# Patient Record
Sex: Female | Born: 1945 | Race: White | Hispanic: No | Marital: Single | Smoking: Never smoker
Health system: Southern US, Community
[De-identification: ages and names within clinical notes are randomized; demographics above are authoritative.]

## PROBLEM LIST (undated history)

## (undated) DIAGNOSIS — E785 Hyperlipidemia, unspecified: Secondary | ICD-10-CM

## (undated) DIAGNOSIS — I10 Essential (primary) hypertension: Secondary | ICD-10-CM

## (undated) DIAGNOSIS — U071 COVID-19: Secondary | ICD-10-CM

## (undated) DIAGNOSIS — K56609 Unspecified intestinal obstruction, unspecified as to partial versus complete obstruction: Secondary | ICD-10-CM

## (undated) DIAGNOSIS — K219 Gastro-esophageal reflux disease without esophagitis: Secondary | ICD-10-CM

## (undated) DIAGNOSIS — Z8719 Personal history of other diseases of the digestive system: Secondary | ICD-10-CM

## (undated) HISTORY — PX: BREAST CYST EXCISION: SHX579

## (undated) HISTORY — PX: ABDOMINAL HYSTERECTOMY: SHX81

## (undated) HISTORY — DX: Gastro-esophageal reflux disease without esophagitis: K21.9

## (undated) HISTORY — DX: COVID-19: U07.1

## (undated) HISTORY — PX: EXPLORATORY LAPAROTOMY: SUR591

## (undated) HISTORY — PX: APPENDECTOMY: SHX54

---

## 2000-05-07 ENCOUNTER — Encounter: Admission: RE | Admit: 2000-05-07 | Discharge: 2000-05-07 | Payer: Self-pay | Admitting: Gynecology

## 2000-05-07 ENCOUNTER — Encounter: Payer: Self-pay | Admitting: Gynecology

## 2000-11-24 ENCOUNTER — Encounter: Payer: Self-pay | Admitting: Gynecology

## 2000-11-24 ENCOUNTER — Encounter: Admission: RE | Admit: 2000-11-24 | Discharge: 2000-11-24 | Payer: Self-pay | Admitting: Gynecology

## 2001-01-18 ENCOUNTER — Other Ambulatory Visit: Admission: RE | Admit: 2001-01-18 | Discharge: 2001-01-18 | Payer: Self-pay | Admitting: Gynecology

## 2001-06-09 ENCOUNTER — Ambulatory Visit (HOSPITAL_COMMUNITY): Admission: RE | Admit: 2001-06-09 | Discharge: 2001-06-09 | Payer: Self-pay | Admitting: Family Medicine

## 2001-06-09 ENCOUNTER — Encounter: Payer: Self-pay | Admitting: Family Medicine

## 2001-11-25 ENCOUNTER — Encounter: Admission: RE | Admit: 2001-11-25 | Discharge: 2001-11-25 | Payer: Self-pay | Admitting: Gynecology

## 2001-11-25 ENCOUNTER — Encounter: Payer: Self-pay | Admitting: Gynecology

## 2001-12-31 ENCOUNTER — Ambulatory Visit (HOSPITAL_COMMUNITY): Admission: RE | Admit: 2001-12-31 | Discharge: 2001-12-31 | Payer: Self-pay | Admitting: Internal Medicine

## 2002-03-07 ENCOUNTER — Other Ambulatory Visit: Admission: RE | Admit: 2002-03-07 | Discharge: 2002-03-07 | Payer: Self-pay | Admitting: Gynecology

## 2002-11-29 ENCOUNTER — Encounter: Admission: RE | Admit: 2002-11-29 | Discharge: 2002-11-29 | Payer: Self-pay | Admitting: Gynecology

## 2002-11-29 ENCOUNTER — Encounter: Payer: Self-pay | Admitting: Gynecology

## 2003-03-20 ENCOUNTER — Other Ambulatory Visit: Admission: RE | Admit: 2003-03-20 | Discharge: 2003-03-20 | Payer: Self-pay | Admitting: Gynecology

## 2003-12-06 ENCOUNTER — Encounter: Admission: RE | Admit: 2003-12-06 | Discharge: 2003-12-06 | Payer: Self-pay | Admitting: Gynecology

## 2004-04-30 ENCOUNTER — Other Ambulatory Visit: Admission: RE | Admit: 2004-04-30 | Discharge: 2004-04-30 | Payer: Self-pay | Admitting: Gynecology

## 2005-01-21 ENCOUNTER — Encounter: Admission: RE | Admit: 2005-01-21 | Discharge: 2005-01-21 | Payer: Self-pay | Admitting: Gynecology

## 2005-02-11 ENCOUNTER — Ambulatory Visit: Payer: Self-pay | Admitting: Cardiology

## 2006-02-12 ENCOUNTER — Ambulatory Visit (HOSPITAL_COMMUNITY): Admission: RE | Admit: 2006-02-12 | Discharge: 2006-02-12 | Payer: Self-pay | Admitting: Gynecology

## 2006-02-16 ENCOUNTER — Ambulatory Visit: Payer: Self-pay | Admitting: Internal Medicine

## 2006-03-04 ENCOUNTER — Ambulatory Visit (HOSPITAL_COMMUNITY): Admission: RE | Admit: 2006-03-04 | Discharge: 2006-03-04 | Payer: Self-pay | Admitting: Gynecology

## 2006-03-19 ENCOUNTER — Other Ambulatory Visit: Admission: RE | Admit: 2006-03-19 | Discharge: 2006-03-19 | Payer: Self-pay | Admitting: Gynecology

## 2006-09-07 ENCOUNTER — Encounter: Admission: RE | Admit: 2006-09-07 | Discharge: 2006-09-07 | Payer: Self-pay | Admitting: Gynecology

## 2006-09-08 ENCOUNTER — Encounter (INDEPENDENT_AMBULATORY_CARE_PROVIDER_SITE_OTHER): Payer: Self-pay | Admitting: Specialist

## 2006-09-08 ENCOUNTER — Encounter: Admission: RE | Admit: 2006-09-08 | Discharge: 2006-09-08 | Payer: Self-pay | Admitting: Gynecology

## 2007-04-29 ENCOUNTER — Emergency Department (HOSPITAL_COMMUNITY): Admission: EM | Admit: 2007-04-29 | Discharge: 2007-04-29 | Payer: Self-pay | Admitting: Emergency Medicine

## 2007-09-13 ENCOUNTER — Encounter: Admission: RE | Admit: 2007-09-13 | Discharge: 2007-09-13 | Payer: Self-pay | Admitting: Gynecology

## 2008-10-19 ENCOUNTER — Encounter: Admission: RE | Admit: 2008-10-19 | Discharge: 2008-10-19 | Payer: Self-pay | Admitting: Gynecology

## 2009-10-22 ENCOUNTER — Encounter: Admission: RE | Admit: 2009-10-22 | Discharge: 2009-10-22 | Payer: Self-pay | Admitting: Family Medicine

## 2010-09-18 ENCOUNTER — Other Ambulatory Visit: Payer: Self-pay | Admitting: Family Medicine

## 2010-09-18 DIAGNOSIS — Z1231 Encounter for screening mammogram for malignant neoplasm of breast: Secondary | ICD-10-CM

## 2010-10-18 NOTE — Assessment & Plan Note (Signed)
[facility]                              CARDIOLOGY OFFICE NOTE   NAME:[name] [name]                  MRN:          [id number]  DATE:02/16/2006                            DOB:          01/22/1946    IDENTIFICATION:  [name] is a 65 year old woman with no known  coronary artery disease.  She was followed in the past by Dr. Juanito Doom for  evaluation of dyslipidemia, metabolic syndrome, strong family is of coronary  disease.  She has been maintained on Lipitor.   She was seen 1 year ago.  Since seen, she is doing well.  She notes  occasional chest twinge that will last about a second that occurs with and  without activity.  Otherwise, she is active.  Four days a week she walks  briskly for 40 minutes; otherwise, on the other days, she is active, up and  around and notes no decline in her ability to do things and no chest  pressure with activity.   She is enrolled in [employer] and has noted some weight loss with this.   CURRENT MEDICATIONS:  1. Aspirin 81 mg daily.  2. DynaCirc 10 mg daily.  3. Cozaar 50 mg daily.  4. Lipitor 5 mg daily.  5. Prevacid 30 mg daily.  6. Calcium.  7. Multivitamin daily.   PHYSICAL EXAMINATION:  GENERAL:  The patient is in no distress.  VITAL SIGNS:  Blood pressure 102/73, pulse is 68 and regular.  Weight 186,  down from 191 on last visit.  LUNGS:  Clear.  NECK:  No bruits.  CARDIAC:  Regular rate and rhythm, S1 and S2.  No S3.  No significant  murmurs.  ABDOMEN:  Benign.  EXTREMITIES:  Posterior tibial artery pulses 2+.  No edema.   TWELVE-LEAD EKG:  Normal sinus rhythm at 68 beats per minute.  Nonspecific  ST changes.   IMPRESSION:  1. Dyslipidemia.  Last lipid panel was actually done about a year ago with      an LDL of 64.  Her particle number was 963, HDL of 37, triglycerides of      159.  I would recommend a followup LipoMed as well as an AST and ALT.      Continue regimen for now.  2.  Hypertension, good control.  3. Weight loss.  Again, I applauded the patient and encouraged to her      continue on with [employer] and increase in her activity.   We will set up for 1 year's time.  If she has any symptoms that change, she  should come sooner.  I am not convinced the episodes of chest twinges are  cardiac in nature; they are very atypical.                                Pricilla Riffle, MD, [facility]    PVR/MedQ  DD:  02/16/2006  DT:  02/17/2006  Job #:  [id number]   cc:   Madelin Rear. Sherwood Gambler, MD  Gretta Cool, M.D.

## 2010-10-18 NOTE — Op Note (Signed)
NAME:  [name] [name], [name] [name]                     ACCOUNT NO.:  [id number]   MEDICAL RECORD NO.:  [id number]                   PATIENT TYPE:  AMB   LOCATION:  DAY                                  FACILITY:  [facility]   PHYSICIAN:  Lionel December, M.D.                 DATE OF BIRTH:  1945-12-02   DATE OF PROCEDURE:  12/31/2001  DATE OF DISCHARGE:                                 OPERATIVE REPORT   PROCEDURE PERFORMED:  Esophagogastroduodenoscopy followed by total  colonoscopy.   ENDOSCOPIST:  Lionel December, M.D.   INDICATIONS FOR PROCEDURE:  The patient is a 65 year old Caucasian female  who has chronic GERD symptoms for over four years.  She is doing well on  proton pump inhibitor.  She has failed H2s.  Given chronicity of her  symptoms an EGD was recommended.  She will undergo  esophagogastroduodenoscopy to make sure she does not have a Barrett's  esophagus or a complicated GERD.  She is undergoing screening colonoscopy.  The procedure risks were reviewed with the patient and informed consent was  obtained.   PREOP MEDICATIONS:  Cetacaine spray for pharyngeal topical anesthesia.  Demerol 40 mg IV, Versed 5 mg IV in divided dose.   INSTRUMENT USED:  Olympus video system.   FINDINGS:  Procedure performed in the endoscopy suite.  The patient's vital  signs and oxygen saturations were monitoring during the procedure and  remained stable.   PROCEDURE #1 -  Esophagogastroduodenoscopy.  The patient was placed in left  lateral position and the endoscope was passed per oropharynx without any  difficulty through the esophagus.  Esophagus -  Mucosa of the esophagus was normal throughout.  The  squamocolumnar junction was unremarkable.  There was a small sliding  hiatal  hernia.  Stomach - It was empty and distended on insufflation.  The folds of the  proximal stomach are normal.  Examination of mucosa at gastric body, antrum,  pyloric channel, as well as angularis and fundus was normal.  Duodenum - The duodenum as well as the bulb and second part of the duodenum  was also normal.   The endoscope was withdrawn and the patient prepared for procedure #2.   PROCEDURE #2 - Total colonoscopy.  Rectal examination performed.  No  abnormality noted on external or digital exam.  The scope was placed in the  rectum and advanced to the region of the sigmoid colon.  The sigmoid colon  was tortuous and some difficulty encountered in passing the scope  proximally.  Once the scope was past the splenic flexure it was easily  advanced to the cecum which was identified by the appendiceal stump and  ileocecal valve.  Pictures taken for the record.  Preparation was excellent.  As the scope was withdrawn, the colonoscope mucosa was carefully examined  and there were no polyps, tumor masses or diverticular changes.  Rectal  mucosa similarly is normal.  The scope was  retroflexed to examine the  anorectal junction.  Prominent anal papilla were noted.  The endoscope  straightened and withdrawn.  The patient tolerated the procedure well.   FINAL DIAGNOSES:  Small sliding  hiatal hernia, otherwise normal  esophagogastroduodenoscopy.  Normal total colonoscopy.   RECOMMENDATIONS:  She will continue antireflux measures and Prevacid.  She  also should continue yearly hemoccults.                                                Lionel December, M.D.    NR/MEDQ  D:  12/31/2001  T:  01/05/2002  Job:  [id number]   cc:   Marinus Maw, M.D.

## 2010-10-24 ENCOUNTER — Ambulatory Visit
Admission: RE | Admit: 2010-10-24 | Discharge: 2010-10-24 | Disposition: A | Discharge status: Home / Self Care | Payer: BC Managed Care – PPO | Source: Ambulatory Visit | Attending: Family Medicine | Admitting: Family Medicine

## 2010-10-24 DIAGNOSIS — Z1231 Encounter for screening mammogram for malignant neoplasm of breast: Secondary | ICD-10-CM

## 2011-07-09 ENCOUNTER — Other Ambulatory Visit (HOSPITAL_COMMUNITY): Payer: Self-pay | Admitting: Family Medicine

## 2011-07-09 DIAGNOSIS — Z139 Encounter for screening, unspecified: Secondary | ICD-10-CM

## 2011-07-09 DIAGNOSIS — Z01419 Encounter for gynecological examination (general) (routine) without abnormal findings: Secondary | ICD-10-CM

## 2011-07-16 ENCOUNTER — Ambulatory Visit (HOSPITAL_COMMUNITY)
Admission: RE | Admit: 2011-07-16 | Discharge: 2011-07-16 | Disposition: A | Discharge status: Home / Self Care | Payer: BC Managed Care – PPO | Source: Ambulatory Visit | Attending: Family Medicine | Admitting: Family Medicine

## 2011-07-16 DIAGNOSIS — Z13820 Encounter for screening for osteoporosis: Secondary | ICD-10-CM | POA: Insufficient documentation

## 2011-07-16 DIAGNOSIS — Z780 Asymptomatic menopausal state: Secondary | ICD-10-CM | POA: Insufficient documentation

## 2011-07-16 DIAGNOSIS — Z139 Encounter for screening, unspecified: Secondary | ICD-10-CM

## 2011-07-16 DIAGNOSIS — Z01419 Encounter for gynecological examination (general) (routine) without abnormal findings: Secondary | ICD-10-CM

## 2011-08-25 ENCOUNTER — Ambulatory Visit (HOSPITAL_COMMUNITY)
Admission: RE | Admit: 2011-08-25 | Discharge: 2011-08-25 | Disposition: A | Discharge status: Home / Self Care | Payer: BC Managed Care – PPO | Source: Ambulatory Visit | Attending: Family Medicine | Admitting: Family Medicine

## 2011-08-25 ENCOUNTER — Other Ambulatory Visit (HOSPITAL_COMMUNITY): Payer: Self-pay | Admitting: Family Medicine

## 2011-08-25 DIAGNOSIS — M65849 Other synovitis and tenosynovitis, unspecified hand: Secondary | ICD-10-CM

## 2011-08-25 DIAGNOSIS — M25539 Pain in unspecified wrist: Secondary | ICD-10-CM | POA: Insufficient documentation

## 2011-08-25 DIAGNOSIS — M65839 Other synovitis and tenosynovitis, unspecified forearm: Secondary | ICD-10-CM

## 2011-09-29 ENCOUNTER — Other Ambulatory Visit: Payer: Self-pay | Admitting: Family Medicine

## 2011-09-29 DIAGNOSIS — Z1231 Encounter for screening mammogram for malignant neoplasm of breast: Secondary | ICD-10-CM

## 2011-10-29 ENCOUNTER — Ambulatory Visit
Admission: RE | Admit: 2011-10-29 | Discharge: 2011-10-29 | Disposition: A | Discharge status: Home / Self Care | Payer: BC Managed Care – PPO | Source: Ambulatory Visit | Attending: Family Medicine | Admitting: Family Medicine

## 2011-10-29 DIAGNOSIS — Z1231 Encounter for screening mammogram for malignant neoplasm of breast: Secondary | ICD-10-CM

## 2012-01-08 ENCOUNTER — Encounter (INDEPENDENT_AMBULATORY_CARE_PROVIDER_SITE_OTHER): Payer: Self-pay | Admitting: *Deleted

## 2012-01-21 ENCOUNTER — Other Ambulatory Visit (INDEPENDENT_AMBULATORY_CARE_PROVIDER_SITE_OTHER): Payer: Self-pay | Admitting: *Deleted

## 2012-01-21 ENCOUNTER — Telehealth (INDEPENDENT_AMBULATORY_CARE_PROVIDER_SITE_OTHER): Payer: Self-pay | Admitting: *Deleted

## 2012-01-21 DIAGNOSIS — Z1211 Encounter for screening for malignant neoplasm of colon: Secondary | ICD-10-CM

## 2012-01-21 NOTE — Telephone Encounter (Signed)
Patient needs movi prep 

## 2012-01-23 MED ORDER — PEG-KCL-NACL-NASULF-NA ASC-C 100 G PO SOLR: 1.0000 | Freq: Once | ORAL | Status: DC

## 2012-01-26 DIAGNOSIS — Z6833 Body mass index (BMI) 33.0-33.9, adult: Secondary | ICD-10-CM | POA: Diagnosis not present

## 2012-01-26 DIAGNOSIS — Z0000 Encounter for general adult medical examination without abnormal findings: Secondary | ICD-10-CM | POA: Diagnosis not present

## 2012-02-09 ENCOUNTER — Other Ambulatory Visit (HOSPITAL_COMMUNITY): Payer: Self-pay | Admitting: Family Medicine

## 2012-02-09 DIAGNOSIS — R945 Abnormal results of liver function studies: Secondary | ICD-10-CM

## 2012-02-11 ENCOUNTER — Ambulatory Visit (HOSPITAL_COMMUNITY)
Admission: RE | Admit: 2012-02-11 | Discharge: 2012-02-11 | Disposition: A | Discharge status: Home / Self Care | Payer: Medicare Other | Source: Ambulatory Visit | Attending: Family Medicine | Admitting: Family Medicine

## 2012-02-11 DIAGNOSIS — K7689 Other specified diseases of liver: Secondary | ICD-10-CM | POA: Insufficient documentation

## 2012-02-11 DIAGNOSIS — R945 Abnormal results of liver function studies: Secondary | ICD-10-CM

## 2012-02-11 DIAGNOSIS — R748 Abnormal levels of other serum enzymes: Secondary | ICD-10-CM | POA: Insufficient documentation

## 2012-02-19 ENCOUNTER — Telehealth (INDEPENDENT_AMBULATORY_CARE_PROVIDER_SITE_OTHER): Payer: Self-pay | Admitting: *Deleted

## 2012-02-19 NOTE — Telephone Encounter (Signed)
PCP/Requesting MD: golding  Name & DOB: [name] [name] 1945/08/13   Procedure: tcs  Reason/Indication:  screening  Has patient had this procedure before?  yes  If so, when, by whom and where?  2003  Is there a family history of colon cancer?  no  Who?  What age when diagnosed?    Is patient diabetic?   no      Does patient have prosthetic heart valve?  no  Do you have a pacemaker?  no  Has patient had joint replacement within last 12 months?  no  Is patient on Coumadin, Plavix and/or Aspirin? yes  Medications: asa 81 mg daily, meloxicam 15 mg daily, losartan 50 mg daily, atorvastatin 10 mg daily, amlodipine 5 mg daily, omeprazole 40 mg daily, vit e 1200 mg bid, vit d 1000 mg daily, glucosamine 1500 mg bid, clacium 600 mg w/ d3 bid, multi vit, potassium  Allergies: nkda  Medication Adjustment: asa 2 days  Procedure date & time: 03/18/12 at 930

## 2012-02-24 NOTE — Telephone Encounter (Signed)
Agree 

## 2012-03-08 ENCOUNTER — Other Ambulatory Visit (HOSPITAL_COMMUNITY): Payer: Self-pay | Admitting: Family Medicine

## 2012-03-08 DIAGNOSIS — Z139 Encounter for screening, unspecified: Secondary | ICD-10-CM

## 2012-03-10 ENCOUNTER — Encounter (HOSPITAL_COMMUNITY): Payer: Self-pay | Admitting: Pharmacy Technician

## 2012-03-17 MED ORDER — SODIUM CHLORIDE 0.45 % IV SOLN
INTRAVENOUS | Status: DC
  Administered 2012-03-18: 09:00:00 via INTRAVENOUS

## 2012-03-18 ENCOUNTER — Encounter (HOSPITAL_COMMUNITY): Payer: Self-pay | Admitting: *Deleted

## 2012-03-18 ENCOUNTER — Ambulatory Visit (HOSPITAL_COMMUNITY)
Admission: RE | Admit: 2012-03-18 | Discharge: 2012-03-18 | Disposition: A | Discharge status: Home / Self Care | Payer: Medicare Other | Source: Ambulatory Visit | Attending: Internal Medicine | Admitting: Internal Medicine

## 2012-03-18 ENCOUNTER — Encounter (HOSPITAL_COMMUNITY)
Admission: RE | Disposition: A | Discharge status: Home / Self Care | Payer: Self-pay | Source: Ambulatory Visit | Attending: Internal Medicine

## 2012-03-18 DIAGNOSIS — K6389 Other specified diseases of intestine: Secondary | ICD-10-CM | POA: Diagnosis not present

## 2012-03-18 DIAGNOSIS — K644 Residual hemorrhoidal skin tags: Secondary | ICD-10-CM | POA: Insufficient documentation

## 2012-03-18 DIAGNOSIS — D175 Benign lipomatous neoplasm of intra-abdominal organs: Secondary | ICD-10-CM | POA: Diagnosis not present

## 2012-03-18 DIAGNOSIS — Z1211 Encounter for screening for malignant neoplasm of colon: Secondary | ICD-10-CM

## 2012-03-18 HISTORY — PX: COLONOSCOPY: SHX5424

## 2012-03-18 HISTORY — DX: Personal history of other diseases of the digestive system: Z87.19

## 2012-03-18 SURGERY — COLONOSCOPY
Anesthesia: Moderate Sedation

## 2012-03-18 MED ORDER — MIDAZOLAM HCL 5 MG/5ML IJ SOLN
INTRAMUSCULAR | Status: AC
  Filled 2012-03-18: qty 10

## 2012-03-18 MED ORDER — MEPERIDINE HCL 50 MG/ML IJ SOLN
INTRAMUSCULAR | Status: AC
  Filled 2012-03-18: qty 1

## 2012-03-18 MED ORDER — MEPERIDINE HCL 50 MG/ML IJ SOLN
INTRAMUSCULAR | Status: DC | PRN
  Administered 2012-03-18 (×2): 25 mg via INTRAVENOUS

## 2012-03-18 MED ORDER — STERILE WATER FOR IRRIGATION IR SOLN
Status: DC | PRN
  Administered 2012-03-18: 10:00:00

## 2012-03-18 MED ORDER — MIDAZOLAM HCL 5 MG/5ML IJ SOLN
INTRAMUSCULAR | Status: DC | PRN
  Administered 2012-03-18 (×2): 2 mg via INTRAVENOUS
  Administered 2012-03-18: 1 mg via INTRAVENOUS

## 2012-03-18 NOTE — H&P (Signed)
[name] [name] is an 66 y.o. female.   Chief Complaint: Patient is here for colonoscopy. HPI: Patient is 66 year old Caucasian female colonoscopy. Patient's last exam was 10 years. She denies abdominal pain change in bowel habits or rectal bleeding. Family history is negative for colorectal carcinoma.  Past Medical History  Diagnosis Date  . H/O hiatal hernia     Past Surgical History  Procedure Date  . Appendectomy   . Abdominal hysterectomy     History reviewed. No pertinent family history. Social History:  reports that she has never smoked. She does not have any smokeless tobacco history on file. She reports that she does not drink alcohol or use illicit drugs.  Allergies: No Known Allergies  Medications Prior to Admission  Medication Sig Dispense Refill  . amLODipine (NORVASC) 5 MG tablet Take 5 mg by mouth daily.      Marland Kitchen aspirin EC 81 MG tablet Take 81 mg by mouth daily.      Marland Kitchen atorvastatin (LIPITOR) 10 MG tablet Take 10 mg by mouth daily.      . beta carotene w/minerals (OCUVITE) tablet Take 1 tablet by mouth daily.      . Calcium Carbonate-Vitamin D (CALCIUM 600 + D PO) Take 1 tablet by mouth 2 (two) times daily.      . cholecalciferol (VITAMIN D) 1000 UNITS tablet Take 1,000 Units by mouth daily.      Marland Kitchen losartan (COZAAR) 50 MG tablet Take 50 mg by mouth daily.      . meloxicam (MOBIC) 15 MG tablet Take 15 mg by mouth daily.      . Misc Natural Products (GLUCOS-CHONDROIT-MSM COMPLEX PO) Take 1 capsule by mouth 2 (two) times daily.      . Multiple Vitamin (MULTIVITAMIN WITH MINERALS) TABS Take 1 tablet by mouth daily.      . Omega-3 Fatty Acids (FISH OIL) 1200 MG CAPS Take 1,200 mg by mouth 2 (two) times daily.      Marland Kitchen omeprazole (PRILOSEC) 40 MG capsule Take 40 mg by mouth daily.      . peg 3350 powder (MOVIPREP) 100 G SOLR Take 1 kit (100 g total) by mouth once.  1 kit  0  . Potassium 99 MG TABS Take 1 tablet by mouth daily.        No results found for this or any  previous visit (from the past 48 hour(s)). No results found.  ROS  Blood pressure 137/91, pulse 73, temperature 97.8 F (36.6 C), temperature source Oral, resp. rate 24, height 5\' 4"  (1.626 m), weight 190 lb (86.183 kg), SpO2 95.00%. Physical Exam  Constitutional: She appears well-developed and well-nourished.  HENT:  Mouth/Throat: Oropharynx is clear and moist.  Eyes: Conjunctivae normal are normal. No scleral icterus.  Cardiovascular: Normal rate, regular rhythm and normal heart sounds.   No murmur heard. Respiratory: Effort normal and breath sounds normal.  GI: Soft. She exhibits no mass. There is no tenderness.  Musculoskeletal: She exhibits no edema.  Lymphadenopathy:    She has no cervical adenopathy.  Neurological: She is alert.  Skin: Skin is warm and dry.     Assessment/Plan Average risk screening colonoscopy.  REHMAN,NAJEEB U 03/18/2012, 9:37 AM

## 2012-03-18 NOTE — Op Note (Signed)
COLONOSCOPY PROCEDURE REPORT  PATIENT:  [name] [name]  MR#:  [id number] Birthdate:  [birth year], 66 y.o., female Endoscopist:  Dr. Malissa Hippo, MD Referred By:  Dr. Colette Ribas, MD Procedure Date: 03/18/2012  Procedure:   Colonoscopy  Indications: Patient is 65 year old Caucasian female was undergoing average risk screening colonoscopy. Patient's last exam was 10 years ago.  Informed Consent:  The procedure and risks were reviewed with the patient and informed consent was obtained.  Medications:  Demerol 50 mg IV Versed 5 mg IV  Description of procedure:  After a digital rectal exam was performed, that colonoscope was advanced from the anus through the rectum and colon to the area of the cecum, ileocecal valve and appendiceal orifice. The cecum was deeply intubated. These structures were well-seen and photographed for the record. From the level of the cecum and ileocecal valve, the scope was slowly and cautiously withdrawn. The mucosal surfaces were carefully surveyed utilizing scope tip to flexion to facilitate fold flattening as needed. The scope was pulled down into the rectum where a thorough exam including retroflexion was performed.  Findings:   Prep satisfactory. 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 Normal mucosa throughout. Normal rectal mucosa. Two anal papillae and small hemorrhoids below the dentate line.  Therapeutic/Diagnostic Maneuvers Performed:  None  Complications:  None  Cecal Withdrawal Time:  12 minutes  Impression:  Examination performed to cecum. No evidence of colonic polyps. 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  Recommendations:  Standard instructions given. Next screening exam in 10 years.  Alease Fait U  03/18/2012 10:08 AM  CC: Dr. Colette Ribas, MD & Dr. Bonnetta Barry ref. provider found

## 2012-03-19 LAB — HM COLONOSCOPY

## 2012-03-24 DIAGNOSIS — Z961 Presence of intraocular lens: Secondary | ICD-10-CM | POA: Diagnosis not present

## 2012-03-24 DIAGNOSIS — H35349 Macular cyst, hole, or pseudohole, unspecified eye: Secondary | ICD-10-CM | POA: Diagnosis not present

## 2012-03-24 DIAGNOSIS — H4720 Unspecified optic atrophy: Secondary | ICD-10-CM | POA: Diagnosis not present

## 2012-03-24 DIAGNOSIS — H31009 Unspecified chorioretinal scars, unspecified eye: Secondary | ICD-10-CM | POA: Diagnosis not present

## 2012-03-25 ENCOUNTER — Encounter (HOSPITAL_COMMUNITY): Payer: Self-pay | Admitting: Internal Medicine

## 2012-05-12 ENCOUNTER — Other Ambulatory Visit (HOSPITAL_COMMUNITY): Payer: Self-pay | Admitting: Physician Assistant

## 2012-05-12 ENCOUNTER — Ambulatory Visit (HOSPITAL_COMMUNITY)
Admission: RE | Admit: 2012-05-12 | Discharge: 2012-05-12 | Disposition: A | Discharge status: Home / Self Care | Payer: Medicare Other | Source: Ambulatory Visit | Attending: Physician Assistant | Admitting: Physician Assistant

## 2012-05-12 DIAGNOSIS — M25569 Pain in unspecified knee: Secondary | ICD-10-CM

## 2012-05-12 DIAGNOSIS — Z6834 Body mass index (BMI) 34.0-34.9, adult: Secondary | ICD-10-CM | POA: Diagnosis not present

## 2012-05-12 DIAGNOSIS — M1710 Unilateral primary osteoarthritis, unspecified knee: Secondary | ICD-10-CM | POA: Diagnosis not present

## 2012-05-12 DIAGNOSIS — M2240 Chondromalacia patellae, unspecified knee: Secondary | ICD-10-CM | POA: Diagnosis not present

## 2012-06-28 DIAGNOSIS — M25569 Pain in unspecified knee: Secondary | ICD-10-CM | POA: Diagnosis not present

## 2012-06-28 DIAGNOSIS — Z6834 Body mass index (BMI) 34.0-34.9, adult: Secondary | ICD-10-CM | POA: Diagnosis not present

## 2012-06-28 DIAGNOSIS — M159 Polyosteoarthritis, unspecified: Secondary | ICD-10-CM | POA: Diagnosis not present

## 2012-09-13 DIAGNOSIS — J019 Acute sinusitis, unspecified: Secondary | ICD-10-CM | POA: Diagnosis not present

## 2012-09-13 DIAGNOSIS — I10 Essential (primary) hypertension: Secondary | ICD-10-CM | POA: Diagnosis not present

## 2012-09-13 DIAGNOSIS — Z6834 Body mass index (BMI) 34.0-34.9, adult: Secondary | ICD-10-CM | POA: Diagnosis not present

## 2012-09-13 DIAGNOSIS — Z713 Dietary counseling and surveillance: Secondary | ICD-10-CM | POA: Diagnosis not present

## 2012-09-13 DIAGNOSIS — Z7182 Exercise counseling: Secondary | ICD-10-CM | POA: Diagnosis not present

## 2012-09-23 ENCOUNTER — Other Ambulatory Visit: Payer: Self-pay

## 2012-09-23 DIAGNOSIS — Z1231 Encounter for screening mammogram for malignant neoplasm of breast: Secondary | ICD-10-CM

## 2012-09-27 ENCOUNTER — Other Ambulatory Visit (HOSPITAL_COMMUNITY): Payer: Self-pay | Admitting: Physician Assistant

## 2012-09-27 DIAGNOSIS — R945 Abnormal results of liver function studies: Secondary | ICD-10-CM

## 2012-09-30 ENCOUNTER — Ambulatory Visit (HOSPITAL_COMMUNITY): Payer: Medicare Other

## 2012-10-04 ENCOUNTER — Ambulatory Visit (HOSPITAL_COMMUNITY)
Admission: RE | Admit: 2012-10-04 | Discharge: 2012-10-04 | Disposition: A | Discharge status: Home / Self Care | Payer: Medicare Other | Source: Ambulatory Visit | Attending: Physician Assistant | Admitting: Physician Assistant

## 2012-10-04 DIAGNOSIS — R9389 Abnormal findings on diagnostic imaging of other specified body structures: Secondary | ICD-10-CM | POA: Insufficient documentation

## 2012-10-04 DIAGNOSIS — R748 Abnormal levels of other serum enzymes: Secondary | ICD-10-CM | POA: Diagnosis not present

## 2012-10-04 DIAGNOSIS — N281 Cyst of kidney, acquired: Secondary | ICD-10-CM | POA: Diagnosis not present

## 2012-10-04 DIAGNOSIS — K7689 Other specified diseases of liver: Secondary | ICD-10-CM | POA: Insufficient documentation

## 2012-10-04 DIAGNOSIS — R945 Abnormal results of liver function studies: Secondary | ICD-10-CM

## 2012-10-05 ENCOUNTER — Other Ambulatory Visit (HOSPITAL_COMMUNITY): Payer: Self-pay | Admitting: Physician Assistant

## 2012-10-05 DIAGNOSIS — Q613 Polycystic kidney, unspecified: Secondary | ICD-10-CM

## 2012-10-07 ENCOUNTER — Ambulatory Visit (HOSPITAL_COMMUNITY)
Admission: RE | Admit: 2012-10-07 | Discharge: 2012-10-07 | Disposition: A | Discharge status: Home / Self Care | Payer: Medicare Other | Source: Ambulatory Visit | Attending: Physician Assistant | Admitting: Physician Assistant

## 2012-10-07 DIAGNOSIS — K7689 Other specified diseases of liver: Secondary | ICD-10-CM | POA: Insufficient documentation

## 2012-10-07 DIAGNOSIS — Q619 Cystic kidney disease, unspecified: Secondary | ICD-10-CM | POA: Insufficient documentation

## 2012-10-07 DIAGNOSIS — D3000 Benign neoplasm of unspecified kidney: Secondary | ICD-10-CM | POA: Diagnosis not present

## 2012-10-07 DIAGNOSIS — Q613 Polycystic kidney, unspecified: Secondary | ICD-10-CM

## 2012-10-07 LAB — POCT I-STAT, CHEM 8
BUN: 10 mg/dL (ref 6–23)
Calcium, Ion: 1.24 mmol/L (ref 1.13–1.30)
Chloride: 106 mEq/L (ref 96–112)
Creatinine, Ser: 0.7 mg/dL (ref 0.50–1.10)
Glucose, Bld: 81 mg/dL (ref 70–99)

## 2012-10-07 MED ORDER — IOHEXOL 300 MG/ML  SOLN
100.0000 mL | Freq: Once | INTRAMUSCULAR | Status: AC | PRN
  Administered 2012-10-07: 100 mL via INTRAVENOUS

## 2012-10-07 NOTE — Progress Notes (Signed)
Blood sample obtained from left arm IV for Creatnine level.  

## 2012-10-29 ENCOUNTER — Ambulatory Visit
Admission: RE | Admit: 2012-10-29 | Discharge: 2012-10-29 | Disposition: A | Discharge status: Home / Self Care | Payer: Medicare Other | Source: Ambulatory Visit

## 2012-10-29 DIAGNOSIS — Z1231 Encounter for screening mammogram for malignant neoplasm of breast: Secondary | ICD-10-CM | POA: Diagnosis not present

## 2013-02-12 ENCOUNTER — Emergency Department (HOSPITAL_COMMUNITY): Payer: Medicare Other

## 2013-02-12 ENCOUNTER — Encounter (HOSPITAL_COMMUNITY): Payer: Self-pay | Admitting: *Deleted

## 2013-02-12 ENCOUNTER — Inpatient Hospital Stay (HOSPITAL_COMMUNITY)
Admission: EM | Admit: 2013-02-12 | Discharge: 2013-02-17 | DRG: 358 | Disposition: A | Discharge status: Home / Self Care | Payer: Medicare Other | Attending: Family Medicine | Admitting: Family Medicine

## 2013-02-12 ENCOUNTER — Inpatient Hospital Stay (HOSPITAL_COMMUNITY): Payer: Medicare Other

## 2013-02-12 DIAGNOSIS — R7401 Elevation of levels of liver transaminase levels: Secondary | ICD-10-CM

## 2013-02-12 DIAGNOSIS — R188 Other ascites: Secondary | ICD-10-CM | POA: Diagnosis not present

## 2013-02-12 DIAGNOSIS — R112 Nausea with vomiting, unspecified: Secondary | ICD-10-CM | POA: Diagnosis present

## 2013-02-12 DIAGNOSIS — D72829 Elevated white blood cell count, unspecified: Secondary | ICD-10-CM

## 2013-02-12 DIAGNOSIS — K56609 Unspecified intestinal obstruction, unspecified as to partial versus complete obstruction: Secondary | ICD-10-CM | POA: Diagnosis not present

## 2013-02-12 DIAGNOSIS — E876 Hypokalemia: Secondary | ICD-10-CM | POA: Diagnosis not present

## 2013-02-12 DIAGNOSIS — K565 Intestinal adhesions [bands], unspecified as to partial versus complete obstruction: Secondary | ICD-10-CM | POA: Diagnosis not present

## 2013-02-12 DIAGNOSIS — K56600 Partial intestinal obstruction, unspecified as to cause: Secondary | ICD-10-CM

## 2013-02-12 DIAGNOSIS — I10 Essential (primary) hypertension: Secondary | ICD-10-CM | POA: Diagnosis present

## 2013-02-12 DIAGNOSIS — Z801 Family history of malignant neoplasm of trachea, bronchus and lung: Secondary | ICD-10-CM

## 2013-02-12 DIAGNOSIS — K7689 Other specified diseases of liver: Secondary | ICD-10-CM | POA: Diagnosis present

## 2013-02-12 DIAGNOSIS — R109 Unspecified abdominal pain: Secondary | ICD-10-CM | POA: Diagnosis not present

## 2013-02-12 DIAGNOSIS — E860 Dehydration: Secondary | ICD-10-CM | POA: Diagnosis not present

## 2013-02-12 DIAGNOSIS — R7402 Elevation of levels of lactic acid dehydrogenase (LDH): Secondary | ICD-10-CM | POA: Diagnosis present

## 2013-02-12 DIAGNOSIS — K458 Other specified abdominal hernia without obstruction or gangrene: Secondary | ICD-10-CM | POA: Diagnosis present

## 2013-02-12 DIAGNOSIS — Z79899 Other long term (current) drug therapy: Secondary | ICD-10-CM | POA: Diagnosis not present

## 2013-02-12 DIAGNOSIS — Z23 Encounter for immunization: Secondary | ICD-10-CM

## 2013-02-12 DIAGNOSIS — R1011 Right upper quadrant pain: Secondary | ICD-10-CM | POA: Diagnosis not present

## 2013-02-12 DIAGNOSIS — Z8051 Family history of malignant neoplasm of kidney: Secondary | ICD-10-CM

## 2013-02-12 DIAGNOSIS — K760 Fatty (change of) liver, not elsewhere classified: Secondary | ICD-10-CM

## 2013-02-12 DIAGNOSIS — D3000 Benign neoplasm of unspecified kidney: Secondary | ICD-10-CM | POA: Diagnosis not present

## 2013-02-12 LAB — COMPREHENSIVE METABOLIC PANEL
AST: 41 U/L — ABNORMAL HIGH (ref 0–37)
Albumin: 4 g/dL (ref 3.5–5.2)
BUN: 13 mg/dL (ref 6–23)
Calcium: 9.6 mg/dL (ref 8.4–10.5)
Creatinine, Ser: 0.6 mg/dL (ref 0.50–1.10)
GFR calc non Af Amer: >90 mL/min (ref >90)
Total Bilirubin: 1.1 mg/dL (ref 0.3–1.2)

## 2013-02-12 LAB — CBC WITH DIFFERENTIAL/PLATELET
Basophils Absolute: 0 10*3/uL (ref 0.0–0.1)
Basophils Relative: 0 % (ref 0–1)
Eosinophils Relative: 0 % (ref 0–5)
HCT: 45.3 % (ref 36.0–46.0)
Hemoglobin: 15.3 g/dL — ABNORMAL HIGH (ref 12.0–15.0)
MCH: 30.5 pg (ref 26.0–34.0)
MCHC: 33.8 g/dL (ref 30.0–36.0)
MCV: 90.4 fL (ref 78.0–100.0)
Monocytes Absolute: 1.3 10*3/uL — ABNORMAL HIGH (ref 0.1–1.0)
Monocytes Relative: 7 % (ref 3–12)
RDW: 13.1 % (ref 11.5–15.5)

## 2013-02-12 LAB — LIPASE, BLOOD: Lipase: 35 U/L (ref 11–59)

## 2013-02-12 MED ORDER — SODIUM CHLORIDE 0.9 % IV SOLN
1000.0000 mL | Freq: Once | INTRAVENOUS | Status: AC
  Administered 2013-02-12: 1000 mL via INTRAVENOUS

## 2013-02-12 MED ORDER — ACETAMINOPHEN 325 MG PO TABS: 650.0000 mg | ORAL_TABLET | Freq: Four times a day (QID) | ORAL | Status: DC | PRN

## 2013-02-12 MED ORDER — MORPHINE SULFATE 4 MG/ML IJ SOLN
4.0000 mg | Freq: Once | INTRAMUSCULAR | Status: AC
  Administered 2013-02-12: 4 mg via INTRAVENOUS
  Filled 2013-02-12: qty 1

## 2013-02-12 MED ORDER — ONDANSETRON HCL 4 MG/2ML IJ SOLN
4.0000 mg | Freq: Four times a day (QID) | INTRAMUSCULAR | Status: DC | PRN
  Filled 2013-02-12: qty 2

## 2013-02-12 MED ORDER — ONDANSETRON HCL 4 MG/2ML IJ SOLN
4.0000 mg | Freq: Three times a day (TID) | INTRAMUSCULAR | Status: DC | PRN
  Administered 2013-02-12: 4 mg via INTRAVENOUS
  Filled 2013-02-12: qty 2

## 2013-02-12 MED ORDER — INFLUENZA VAC SPLIT QUAD 0.5 ML IM SUSP
0.5000 mL | INTRAMUSCULAR | Status: AC
  Administered 2013-02-13: 0.5 mL via INTRAMUSCULAR
  Filled 2013-02-12: qty 0.5

## 2013-02-12 MED ORDER — MORPHINE SULFATE 4 MG/ML IJ SOLN
4.0000 mg | INTRAMUSCULAR | Status: DC | PRN
  Administered 2013-02-12: 4 mg via INTRAVENOUS
  Filled 2013-02-12: qty 1

## 2013-02-12 MED ORDER — SODIUM CHLORIDE 0.9 % IV SOLN: INTRAVENOUS | Status: DC

## 2013-02-12 MED ORDER — SODIUM CHLORIDE 0.9 % IV SOLN
3.0000 g | Freq: Once | INTRAVENOUS | Status: AC
  Administered 2013-02-12: 3 g via INTRAVENOUS
  Filled 2013-02-12: qty 3

## 2013-02-12 MED ORDER — SODIUM CHLORIDE 0.9 % IV SOLN
1000.0000 mL | INTRAVENOUS | Status: DC
  Administered 2013-02-12 – 2013-02-13 (×2): 1000 mL via INTRAVENOUS

## 2013-02-12 MED ORDER — SODIUM CHLORIDE 0.9 % IV BOLUS (SEPSIS): 1000.0000 mL | Freq: Once | INTRAVENOUS | Status: DC

## 2013-02-12 MED ORDER — IOHEXOL 300 MG/ML  SOLN
50.0000 mL | Freq: Once | INTRAMUSCULAR | Status: AC | PRN
  Administered 2013-02-12: 50 mL via ORAL

## 2013-02-12 MED ORDER — ONDANSETRON HCL 4 MG/2ML IJ SOLN
4.0000 mg | Freq: Once | INTRAMUSCULAR | Status: AC
  Administered 2013-02-12: 4 mg via INTRAVENOUS
  Filled 2013-02-12: qty 2

## 2013-02-12 MED ORDER — ENOXAPARIN SODIUM 40 MG/0.4ML ~~LOC~~ SOLN
40.0000 mg | SUBCUTANEOUS | Status: DC
  Administered 2013-02-12 – 2013-02-13 (×2): 40 mg via SUBCUTANEOUS
  Filled 2013-02-12 (×2): qty 0.4

## 2013-02-12 MED ORDER — IOHEXOL 300 MG/ML  SOLN
100.0000 mL | Freq: Once | INTRAMUSCULAR | Status: AC | PRN
  Administered 2013-02-12: 100 mL via INTRAVENOUS

## 2013-02-12 MED ORDER — PIPERACILLIN-TAZOBACTAM 3.375 G IVPB
3.3750 g | Freq: Three times a day (TID) | INTRAVENOUS | Status: DC
  Administered 2013-02-12 – 2013-02-17 (×15): 3.375 g via INTRAVENOUS
  Filled 2013-02-12 (×18): qty 50

## 2013-02-12 MED ORDER — MORPHINE SULFATE 2 MG/ML IJ SOLN: 2.0000 mg | INTRAMUSCULAR | Status: DC | PRN

## 2013-02-12 MED ORDER — PIPERACILLIN-TAZOBACTAM 3.375 G IVPB
INTRAVENOUS | Status: AC
  Filled 2013-02-12: qty 100

## 2013-02-12 MED ORDER — ACETAMINOPHEN 650 MG RE SUPP: 650.0000 mg | Freq: Four times a day (QID) | RECTAL | Status: DC | PRN

## 2013-02-12 MED ORDER — ONDANSETRON HCL 4 MG PO TABS: 4.0000 mg | ORAL_TABLET | Freq: Four times a day (QID) | ORAL | Status: DC | PRN

## 2013-02-12 NOTE — ED Notes (Signed)
Pain and nausea are starting to return, Dr. Lynelle Doctor notified, additional orders given

## 2013-02-12 NOTE — H&P (Signed)
[facility] History and Physical  SHAQUERA [name] HYQ:[id number] DOB: 1945/11/12 DOA: 02/12/2013  Referring physician: Dr. Lynelle Doctor, ER physician PCP: Colette Ribas, MD  Specialists:   Chief Complaint: Vomiting and abdominal pain  HPI: [name] [name] is a 67 y.o. female who presents to the emergency room with complaints of intractable vomiting and abdominal pain. She reports onset of his symptoms occurring yesterday after she ate cereal and bananas. She reports subsequently she developed lower abdominal pain as well as persistent vomiting. Initially vomitus contained ingested food material, subsequently it became clear. She denied any hematemesis. She reports having bowel movements, last one was on the day of admission. She does not feel that she has been passing flatus. She was also noted abdominal distention. Prior surgical history does include an appendectomy and hysterectomy in the past. She's not had any bowel obstructions in the past. She has felt flushed at home but has not documented a fever. No shortness of breath, cough, dysuria, chest pain, melena, hematochezia or complaints. She was evaluated in the emergency room where CT scan of the abdomen and pelvis indicated a partial small bowel obstruction. Patient has been referred for admission.  Review of Systems: Pertinent positives as per history of present illness, otherwise negative  Past Medical History  Diagnosis Date  . H/O hiatal hernia    Past Surgical History  Procedure Laterality Date  . Appendectomy    . Abdominal hysterectomy    . Colonoscopy  03/18/2012    Procedure: COLONOSCOPY;  Surgeon: Malissa Hippo, MD;  Location: [facility];  Service: Endoscopy;  Laterality: N/A;  930   Social History:  reports that she has never smoked. She does not have any smokeless tobacco history on file. She reports that she does not drink alcohol or use illicit drugs.   No Known Allergies  Family history: Father died  due to metastatic lung cancer, mother died from kidney cancer.  Prior to Admission medications   Medication Sig Start Date End Date Taking? Authorizing Provider  amLODipine (NORVASC) 5 MG tablet Take 5 mg by mouth daily.   Yes Historical Provider, MD  aspirin EC 81 MG tablet Take 81 mg by mouth daily.   Yes Historical Provider, MD  atorvastatin (LIPITOR) 10 MG tablet Take 10 mg by mouth daily.   Yes Historical Provider, MD  beta carotene w/minerals (OCUVITE) tablet Take 1 tablet by mouth daily.   Yes Historical Provider, MD  Calcium Carbonate-Vitamin D (CALCIUM 600 + D PO) Take 1 tablet by mouth 2 (two) times daily.   Yes Historical Provider, MD  cholecalciferol (VITAMIN D) 1000 UNITS tablet Take 1,000 Units by mouth daily.   Yes Historical Provider, MD  losartan (COZAAR) 50 MG tablet Take 50 mg by mouth daily.   Yes Historical Provider, MD  Misc Natural Products (GLUCOS-CHONDROIT-MSM COMPLEX PO) Take 1 capsule by mouth 2 (two) times daily.   Yes Historical Provider, MD  Multiple Vitamin (MULTIVITAMIN WITH MINERALS) TABS Take 1 tablet by mouth daily.   Yes Historical Provider, MD  Omega-3 Fatty Acids (FISH OIL) 1200 MG CAPS Take 1,200 mg by mouth 2 (two) times daily.   Yes Historical Provider, MD  omeprazole (PRILOSEC) 40 MG capsule Take 40 mg by mouth daily.   Yes Historical Provider, MD  Potassium 99 MG TABS Take 1 tablet by mouth daily.   Yes Historical Provider, MD   Physical Exam: Filed Vitals:   02/12/13 1843  BP: 132/80  Pulse: 83  Temp: 97.8  F (36.6 C)  Resp:      General:  NAD  Eyes: Pupils are equal, round, reactive to light  ENT: Mucous membranes are dry  Neck: Supple  Cardiovascular: S1, S2, regular rate and rhythm  Respiratory: Clear to auscultation bilaterally  Abdomen: Soft, tender in the right lower quadrant as well as epigastrium, bowel sounds are very sluggish to absent.  Skin: No rashes  Musculoskeletal: No pedal edema bilaterally  Psychiatric: Normal  affect, cooperative with exam  Neurologic: Grossly intact, nonfocal  Labs on Admission:  Basic Metabolic Panel:  Recent Labs Lab 02/12/13 1435  NA 136  K 3.6  CL 98  CO2 24  GLUCOSE 174*  BUN 13  CREATININE 0.60  CALCIUM 9.6   Liver Function Tests:  Recent Labs Lab 02/12/13 1435  AST 41*  ALT 46*  ALKPHOS 89  BILITOT 1.1  PROT 7.6  ALBUMIN 4.0    Recent Labs Lab 02/12/13 1435  LIPASE 35   No results found for this basename: AMMONIA,  in the last 168 hours CBC:  Recent Labs Lab 02/12/13 1435  WBC 19.7*  NEUTROABS 16.6*  HGB 15.3*  HCT 45.3  MCV 90.4  PLT 303   Cardiac Enzymes: No results found for this basename: CKTOTAL, CKMB, CKMBINDEX, TROPONINI,  in the last 168 hours  BNP (last 3 results) No results found for this basename: PROBNP,  in the last 8760 hours CBG: No results found for this basename: GLUCAP,  in the last 168 hours  Radiological Exams on Admission: Ct Abdomen Pelvis W Contrast  02/12/2013   CLINICAL DATA:  Mid abdominal pain, nausea and vomiting for 2 days, hysterectomy, appendectomy  EXAM: CT ABDOMEN AND PELVIS WITH CONTRAST  TECHNIQUE: Multidetector CT imaging of the abdomen and pelvis was performed using the standard protocol following bolus administration of intravenous contrast.  CONTRAST:  50mL OMNIPAQUE IOHEXOL 300 MG/ML SOLN, OMNIPAQUE IOHEXOL 300 MG/ML SOLN  COMPARISON:  10/07/2012  FINDINGS: Sagittal images of the spine shows diffuse osteopenia. Disc space flattening with mild posterior disc bulge and mild posterior spurring at L5-S1 level. Lung bases are unremarkable.  Mild hepatic fatty infiltration. No focal hepatic mass. No calcified gallstones are noted within gallbladder.  Kidneys are symmetrical in size and enhancement. Diffuse fatty replacement of the pancreas again noted. The spleen and adrenals are unremarkable.  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  No aortic aneurysm. Delayed renal images  shows bilateral renal symmetrical excretion. Small hiatal hernia is noted. There is a proximal gastric diverticulum measures about 2.7 cm.  There are multiple distended small bowel loops throughout the abdomen. Findings are highly suspicious for at least partial small bowel obstruction. In coronal image 46 there is segmental mild dilatation of distal small bowel up to 2.8 cm. This segment measures about 12 cm in length and containing fecal like material. Beyond this segment the terminal small bowel and terminal ileum are small caliber. The transition point in caliber is best seen in axial image 66.  There is no pericecal inflammation. The transverse colon and distal colon is empty collapsed. Tiny amount of fluid/ stranding is noted in mesenteric fat in right lower abdomen centrally axial image 57. Mild congested central mesenteric vessels.  The urinary bladder is unremarkable.  The patient is status post hysterectomy.  IMPRESSION: 1. There are multiple distended small bowel loops throughout the abdomen. Findings are highly suspicious for at least partial small bowel obstruction. In coronal image 46 there is  segmental mild dilatation of distal small bowel up to 2.8 cm. This segment measures about 12 cm in length and containing fecal like material. Beyond this segment the terminal small bowel and terminal ileum are small caliber. The transition point in caliber is best seen in axial image 66. 2. No hydronephrosis or hydroureter. 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 3. Mild hepatic fatty infiltration. 4. Again noted fatty replacement of the pancreas 5. Status post hysterectomy.   Electronically Signed   By: Natasha Mead   On: 02/12/2013 15:49    Assessment/Plan Principal Problem:   Partial small bowel obstruction Active Problems:   Fatty liver   Transaminitis   Leukocytosis, unspecified   Nausea with vomiting   Abdominal pain, unspecified site   1. Partial small bowel obstruction. Will  place patient with NG tube for decompression. Continue IV fluids. Since she does have a significant leukocytosis we will start empiric antibiotics. Surgery has been consulted and is aware of patient. Patient will be kept n.p.o. for now. She's been encouraged to ambulate. 2. Leukocytosis. Likely related to #1. Urinalysis and chest x-ray are currently pending. Since she has a mild elevation in liver enzymes, we will check ultrasound of the gallbladder. Lipase is normal. 3. Transaminitis. She does have evidence of fatty liver on CT. We'll get ultrasound of gallbladder. Repeat labs in the morning. 4. Nausea and vomiting. We'll provide antiemetics.  Code Status: full code Family Communication: discussed with patient Disposition Plan: discharge home once improved  Time spent:  MEMON,JEHANZEB [facility] Pager [phone]  If 7PM-7AM, please contact night-coverage [email or web address] Password [email or web address] 02/12/2013, 7:00 PM

## 2013-02-12 NOTE — Progress Notes (Signed)
ANTIBIOTIC CONSULT NOTE - INITIAL  Pharmacy Consult for Zosyn Indication: intra-abdominal infection  No Known Allergies  Patient Measurements: Height: 5\' 4"  (162.6 cm) Weight: 198 lb 6.6 oz (90 kg) IBW/kg (Calculated) : 54.7  Vital Signs: Temp: 97.8 F (36.6 C) (09/13 1843) Temp src: Oral (09/13 1843) BP: 132/80 mmHg (09/13 1843) Pulse Rate: 83 (09/13 1843) Intake/Output from previous day:   Intake/Output from this shift:    Labs:  Recent Labs  02/12/13 1435  WBC 19.7*  HGB 15.3*  PLT 303  CREATININE 0.60   Estimated Creatinine Clearance: 75.1 ml/min (by C-G formula based on Cr of 0.6). No results found for this basename: VANCOTROUGH, VANCOPEAK, VANCORANDOM, GENTTROUGH, GENTPEAK, GENTRANDOM, TOBRATROUGH, TOBRAPEAK, TOBRARND, AMIKACINPEAK, AMIKACINTROU, AMIKACIN,  in the last 72 hours   Microbiology: No results found for this or any previous visit (from the past 720 hour(s)).  Medical History: Past Medical History  Diagnosis Date  . H/O hiatal hernia    Medications:  Scheduled:  . enoxaparin (LOVENOX) injection  40 mg Subcutaneous Q24H  . [START ON 02/13/2013] influenza vac split quadrivalent PF  0.5 mL Intramuscular Tomorrow-1000  . piperacillin-tazobactam (ZOSYN)  IV  3.375 g Intravenous Q8H   Assessment: 67yo female admitted with n/v and suspected intra-abdominal infection.  Pt has good renal fxn.  Estimated Creatinine Clearance: 75.1 ml/min (by C-G formula based on Cr of 0.6).  Goal of Therapy:  Eradicate infection.  Plan:  Zosyn 3.375gm IV q8h to be infused over 4 hrs Monitor labs, renal fxn, and cultures  Valrie Hart A 02/12/2013,7:18 PM

## 2013-02-12 NOTE — ED Provider Notes (Signed)
CSN: [id number]     Arrival date & time 02/12/13  1352 History  This chart was scribed for Ward Givens, MD by Quintella Reichert, ED scribe.  This patient was seen in room [facility]05/[facility]05 and the patient's care was started at 2:22 PM.  Chief Complaint  Patient presents with  . Abdominal Pain  . Nausea  . Emesis    The history is provided by the patient. No language interpreter was used.    HPI Comments: [name] [name] is a 67 y.o. female who presents to the Emergency Department complaining of abdominal pain, nausea, and vomiting that began last night.  Pt states that she felt well yesterday morning.  She ate a banana for breakfast and then developed gradual-onset, gradually-worsening, non-radiating upper abdominal pain as well as very mild nausea.  Pain is described as "like someone is putting a fist into my stomach."  Last night after eating a baked potato for dinner in a restaurant she became lightheaded and her nausea became much more severe.  She then went home and vomited.  She has vomited 12 times since last night and has had difficulty tolerating food and liquds.  She last vomited a few minutes ago.  She states that at one point her vomit was "a bit yellow" but she denies hematemesis.  She denies fever or diarrhea and states she has not had weakness or lightheadedness today.  She denies recent sick contact.   Pt states she had a prior "bout" of similar symptoms in July that began after eating strawberries and resolved on its own.  Pt denies h/o cholecystectomy.  She does not smoke or drink.  She lives alone and is healthy and active at baseline.    PCP is Dr. Phillips Odor Pt has no gastroenterologist   Past Medical History  Diagnosis Date  . H/O hiatal hernia     Past Surgical History  Procedure Laterality Date  . Appendectomy    . Abdominal hysterectomy    . Colonoscopy  03/18/2012    Procedure: COLONOSCOPY;  Surgeon: Malissa Hippo, MD;  Location: [facility];  Service: Endoscopy;   Laterality: N/A;  930    History reviewed. No pertinent family history.   History  Substance Use Topics  . Smoking status: Never Smoker   . Smokeless tobacco: Not on file  . Alcohol Use: No  lives at home Lives alone  OB History   Grav Para Term Preterm Abortions TAB SAB Ect Mult Living                  Review of Systems  Constitutional: Negative for fever.  Gastrointestinal: Positive for nausea, vomiting and abdominal pain. Negative for diarrhea.  Neurological: Positive for light-headedness (transient, resolved).  All other systems reviewed and are negative.     Allergies  Review of patient's allergies indicates no known allergies.  Home Medications   Current Outpatient Rx  Name  Route  Sig  Dispense  Refill  . amLODipine (NORVASC) 5 MG tablet   Oral   Take 5 mg by mouth daily.         Marland Kitchen aspirin EC 81 MG tablet   Oral   Take 81 mg by mouth daily.         Marland Kitchen atorvastatin (LIPITOR) 10 MG tablet   Oral   Take 10 mg by mouth daily.         . beta carotene w/minerals (OCUVITE) tablet   Oral   Take 1 tablet  by mouth daily.         . Calcium Carbonate-Vitamin D (CALCIUM 600 + D PO)   Oral   Take 1 tablet by mouth 2 (two) times daily.         . cholecalciferol (VITAMIN D) 1000 UNITS tablet   Oral   Take 1,000 Units by mouth daily.         Marland Kitchen losartan (COZAAR) 50 MG tablet   Oral   Take 50 mg by mouth daily.         . meloxicam (MOBIC) 15 MG tablet   Oral   Take 15 mg by mouth daily.         . Misc Natural Products (GLUCOS-CHONDROIT-MSM COMPLEX PO)   Oral   Take 1 capsule by mouth 2 (two) times daily.         . Multiple Vitamin (MULTIVITAMIN WITH MINERALS) TABS   Oral   Take 1 tablet by mouth daily.         . Omega-3 Fatty Acids (FISH OIL) 1200 MG CAPS   Oral   Take 1,200 mg by mouth 2 (two) times daily.         Marland Kitchen omeprazole (PRILOSEC) 40 MG capsule   Oral   Take 40 mg by mouth daily.         . Potassium 99 MG TABS    Oral   Take 1 tablet by mouth daily.          BP 123/81  Pulse 93  Temp(Src) 97.9 F (36.6 C) (Oral)  Resp 18  Ht 5\' 4"  (1.626 m)  Wt 192 lb (87.091 kg)  BMI 32.94 kg/m2  SpO2 96%  Vital signs normal    Physical Exam  Nursing note and vitals reviewed. Constitutional: She is oriented to person, place, and time. She appears well-developed and well-nourished.  Non-toxic appearance. She does not appear ill. No distress.  HENT:  Head: Normocephalic and atraumatic.  Right Ear: External ear normal.  Left Ear: External ear normal.  Nose: Nose normal. No mucosal edema or rhinorrhea.  Mouth/Throat: Oropharynx is clear and moist. Mucous membranes are dry. No dental abscesses or edematous.  Tongue slightly dry  Eyes: Conjunctivae and EOM are normal. Pupils are equal, round, and reactive to light.  Neck: Normal range of motion and full passive range of motion without pain. Neck supple.  Cardiovascular: Normal rate, regular rhythm and normal heart sounds.  Exam reveals no gallop and no friction rub.   No murmur heard. Pulmonary/Chest: Effort normal and breath sounds normal. No respiratory distress. She has no wheezes. She has no rhonchi. She has no rales. She exhibits no tenderness and no crepitus.  Abdominal: Soft. Normal appearance and bowel sounds are normal. She exhibits no distension. There is tenderness. There is no rebound and no guarding.    Most tender to periumbilical region, but did react to palpation of RUQ, RLQ and epigastric region.  Musculoskeletal: Normal range of motion. She exhibits no edema and no tenderness.  Moves all extremities well.   Neurological: She is alert and oriented to person, place, and time. She has normal strength. No cranial nerve deficit.  Skin: Skin is warm, dry and intact. No rash noted. No erythema. No pallor.  Psychiatric: She has a normal mood and affect. Her speech is normal and behavior is normal. Her mood appears not anxious.    ED Course   Procedures (including critical care time)  Medications  0.9 %  sodium chloride infusion (  0 mLs Intravenous Stopped 02/12/13 1638)    Followed by  0.9 %  sodium chloride infusion (1,000 mLs Intravenous New Bag/Given 02/12/13 1642)  enoxaparin (LOVENOX) injection 40 mg (40 mg Subcutaneous Given 02/12/13 2042)  acetaminophen (TYLENOL) tablet 650 mg (not administered)    Or  acetaminophen (TYLENOL) suppository 650 mg (not administered)  morphine 2 MG/ML injection 2 mg (not administered)  ondansetron (ZOFRAN) tablet 4 mg (not administered)    Or  ondansetron (ZOFRAN) injection 4 mg (not administered)  influenza vac split quadrivalent PF (FLUARIX) injection 0.5 mL (not administered)  piperacillin-tazobactam (ZOSYN) IVPB 3.375 g (3.375 g Intravenous Given 02/12/13 2043)  morphine 4 MG/ML injection 4 mg (4 mg Intravenous Given 02/12/13 1440)  ondansetron (ZOFRAN) injection 4 mg (4 mg Intravenous Given 02/12/13 1439)  iohexol (OMNIPAQUE) 300 MG/ML solution 50 mL (50 mLs Oral Contrast Given 02/12/13 1438)  iohexol (OMNIPAQUE) 300 MG/ML solution 100 mL (100 mLs Intravenous Contrast Given 02/12/13 1520)  morphine 4 MG/ML injection 4 mg (4 mg Intravenous Given 02/12/13 1642)  ondansetron (ZOFRAN) injection 4 mg (4 mg Intravenous Given 02/12/13 1642)  Ampicillin-Sulbactam (UNASYN) 3 g in sodium chloride 0.9 % 100 mL IVPB (3 g Intravenous New Bag/Given 02/12/13 1746)     DIAGNOSTIC STUDIES: Oxygen Saturation is 96% on room air, normal by my interpretation.    COORDINATION OF CARE: 2:31 PM: Discussed treatment plan which includes IV fluids, pain medication, anti-emetics, CT abdomen and labs.  Pt expressed understanding and agreed to plan.  17:00 discussed CT results with patient and need for admission.   17:13 Dr Lovell Sheehan, will consult.   17:39 Dr Kerry Hough, admit to med-surg, team 1  Labs Review Results for orders placed during the hospital encounter of 02/12/13  CBC WITH DIFFERENTIAL      Result Value  Range   WBC 19.7 (*) 4.0 - 10.5 K/uL   RBC 5.01  3.87 - 5.11 MIL/uL   Hemoglobin 15.3 (*) 12.0 - 15.0 g/dL   HCT 16.1  09.6 - 04.5 %   MCV 90.4  78.0 - 100.0 fL   MCH 30.5  26.0 - 34.0 pg   MCHC 33.8  30.0 - 36.0 g/dL   RDW 40.9  81.1 - 91.4 %   Platelets 303  150 - 400 K/uL   Neutrophils Relative % 84 (*) 43 - 77 %   Neutro Abs 16.6 (*) 1.7 - 7.7 K/uL   Lymphocytes Relative 9 (*) 12 - 46 %   Lymphs Abs 1.8  0.7 - 4.0 K/uL   Monocytes Relative 7  3 - 12 %   Monocytes Absolute 1.3 (*) 0.1 - 1.0 K/uL   Eosinophils Relative 0  0 - 5 %   Eosinophils Absolute 0.0  0.0 - 0.7 K/uL   Basophils Relative 0  0 - 1 %   Basophils Absolute 0.0  0.0 - 0.1 K/uL  COMPREHENSIVE METABOLIC PANEL      Result Value Range   Sodium 136  135 - 145 mEq/L   Potassium 3.6  3.5 - 5.1 mEq/L   Chloride 98  96 - 112 mEq/L   CO2 24  19 - 32 mEq/L   Glucose, Bld 174 (*) 70 - 99 mg/dL   BUN 13  6 - 23 mg/dL   Creatinine, Ser 7.82  0.50 - 1.10 mg/dL   Calcium 9.6  8.4 - 95.6 mg/dL   Total Protein 7.6  6.0 - 8.3 g/dL   Albumin 4.0  3.5 - 5.2 g/dL  AST 41 (*) 0 - 37 U/L   ALT 46 (*) 0 - 35 U/L   Alkaline Phosphatase 89  39 - 117 U/L   Total Bilirubin 1.1  0.3 - 1.2 mg/dL   GFR calc non Af Amer >90  >90 mL/min   GFR calc Af Amer >90  >90 mL/min  LIPASE, BLOOD      Result Value Range   Lipase 35  11 - 59 U/L   Laboratory interpretation all normal except leukocytosis, elevation of LFT's     Imaging Review Ct Abdomen Pelvis W Contrast  02/12/2013   CLINICAL DATA:  Mid abdominal pain, nausea and vomiting for 2 days, hysterectomy, appendectomy  EXAM: CT ABDOMEN AND PELVIS WITH CONTRAST  TECHNIQUE: Multidetector CT imaging of the abdomen and pelvis was performed using the standard protocol following bolus administration of intravenous contrast.  CONTRAST:  50mL OMNIPAQUE IOHEXOL 300 MG/ML SOLN, OMNIPAQUE IOHEXOL 300 MG/ML SOLN  COMPARISON:  10/07/2012  FINDINGS: Sagittal images of the spine shows diffuse  osteopenia. Disc space flattening with mild posterior disc bulge and mild posterior spurring at L5-S1 level. Lung bases are unremarkable.  Mild hepatic fatty infiltration. No focal hepatic mass. No calcified gallstones are noted within gallbladder.  Kidneys are symmetrical in size and enhancement. Diffuse fatty replacement of the pancreas again noted. The spleen and adrenals are unremarkable.  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  No aortic aneurysm. Delayed renal images shows bilateral renal symmetrical excretion. Small hiatal hernia is noted. There is a proximal gastric diverticulum measures about 2.7 cm.  There are multiple distended small bowel loops throughout the abdomen. Findings are highly suspicious for at least partial small bowel obstruction. In coronal image 46 there is segmental mild dilatation of distal small bowel up to 2.8 cm. This segment measures about 12 cm in length and containing fecal like material. Beyond this segment the terminal small bowel and terminal ileum are small caliber. The transition point in caliber is best seen in axial image 66.  There is no pericecal inflammation. The transverse colon and distal colon is empty collapsed. Tiny amount of fluid/ stranding is noted in mesenteric fat in right lower abdomen centrally axial image 57. Mild congested central mesenteric vessels.  The urinary bladder is unremarkable.  The patient is status post hysterectomy.  IMPRESSION: 1. There are multiple distended small bowel loops throughout the abdomen. Findings are highly suspicious for at least partial small bowel obstruction. In coronal image 46 there is segmental mild dilatation of distal small bowel up to 2.8 cm. This segment measures about 12 cm in length and containing fecal like material. Beyond this segment the terminal small bowel and terminal ileum are small caliber. The transition point in caliber is best seen in axial image 66. 2. No hydronephrosis or  hydroureter. 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 3. Mild hepatic fatty infiltration. 4. Again noted fatty replacement of the pancreas 5. Status post hysterectomy.   Electronically Signed   By: Natasha Mead   On: 02/12/2013 15:49    MDM   1. Abdominal pain   2. Nausea and vomiting   3. Dehydration   4. Partial small bowel obstruction   5. Leukocytosis, unspecified    Plan admission   Devoria Albe, MD, FACEP     I personally performed the services described in this documentation, which was scribed in my presence. The recorded information has been reviewed and considered.  Devoria Albe, MD, Armando Gang  Ward Givens, MD 02/12/13 2231

## 2013-02-12 NOTE — ED Notes (Signed)
Pt c/o abd pain, n/v that started yesterday, had similar episode over the summer that subsided on it's on. Dr. Lynelle Doctor in room prior to RN, see edp assessment for further,

## 2013-02-12 NOTE — ED Notes (Signed)
Report given to 3rd floor,

## 2013-02-12 NOTE — ED Notes (Signed)
Nausea, vomiting, abdominal pain began last night.  Pain began prior to dinner, got worse after eating.  Had same symptoms last of July this year lasting 12 hours, but it subsided.  Estimated vomiting about 12 times since last night.  No fever.  Has had chills.

## 2013-02-13 ENCOUNTER — Inpatient Hospital Stay (HOSPITAL_COMMUNITY): Payer: Medicare Other

## 2013-02-13 LAB — URINALYSIS, ROUTINE W REFLEX MICROSCOPIC
Glucose, UA: NEGATIVE mg/dL
Ketones, ur: NEGATIVE mg/dL
Leukocytes, UA: NEGATIVE
Nitrite: NEGATIVE
Protein, ur: NEGATIVE mg/dL
pH: 7.5 (ref 5.0–8.0)

## 2013-02-13 LAB — COMPREHENSIVE METABOLIC PANEL
AST: 23 U/L (ref 0–37)
Albumin: 3.1 g/dL — ABNORMAL LOW (ref 3.5–5.2)
Alkaline Phosphatase: 66 U/L (ref 39–117)
Chloride: 102 mEq/L (ref 96–112)
Potassium: 3.2 mEq/L — ABNORMAL LOW (ref 3.5–5.1)
Total Bilirubin: 1 mg/dL (ref 0.3–1.2)
Total Protein: 5.9 g/dL — ABNORMAL LOW (ref 6.0–8.3)

## 2013-02-13 LAB — CBC
MCHC: 32 g/dL (ref 30.0–36.0)
Platelets: 270 10*3/uL (ref 150–400)
RDW: 13.5 % (ref 11.5–15.5)
WBC: 13.7 10*3/uL — ABNORMAL HIGH (ref 4.0–10.5)

## 2013-02-13 LAB — SURGICAL PCR SCREEN: Staphylococcus aureus: POSITIVE — AB

## 2013-02-13 LAB — TSH: TSH: 1.226 u[IU]/mL (ref 0.350–4.500)

## 2013-02-13 MED ORDER — CHLORHEXIDINE GLUCONATE CLOTH 2 % EX PADS
6.0000 | MEDICATED_PAD | Freq: Every day | CUTANEOUS | Status: AC
  Administered 2013-02-13 – 2013-02-17 (×5): 6 via TOPICAL

## 2013-02-13 MED ORDER — HYDRALAZINE HCL 20 MG/ML IJ SOLN
10.0000 mg | Freq: Four times a day (QID) | INTRAMUSCULAR | Status: DC | PRN
  Administered 2013-02-13 – 2013-02-14 (×2): 10 mg via INTRAVENOUS
  Filled 2013-02-13 (×4): qty 1

## 2013-02-13 MED ORDER — PHENOL 1.4 % MT LIQD
1.0000 | OROMUCOSAL | Status: DC | PRN
  Filled 2013-02-13: qty 177

## 2013-02-13 MED ORDER — MUPIROCIN 2 % EX OINT
1.0000 "application " | TOPICAL_OINTMENT | Freq: Two times a day (BID) | CUTANEOUS | Status: DC
  Administered 2013-02-13 – 2013-02-17 (×7): 1 via NASAL

## 2013-02-13 MED ORDER — KCL IN DEXTROSE-NACL 20-5-0.45 MEQ/L-%-% IV SOLN
INTRAVENOUS | Status: DC
  Administered 2013-02-13 – 2013-02-14 (×3): via INTRAVENOUS

## 2013-02-13 MED ORDER — CHLORHEXIDINE GLUCONATE 4 % EX LIQD
1.0000 "application " | Freq: Once | CUTANEOUS | Status: AC
  Administered 2013-02-13: 1 via TOPICAL
  Filled 2013-02-13: qty 15

## 2013-02-13 MED ORDER — POTASSIUM CHLORIDE 10 MEQ/100ML IV SOLN
10.0000 meq | INTRAVENOUS | Status: AC
  Administered 2013-02-13 (×4): 10 meq via INTRAVENOUS
  Filled 2013-02-13: qty 400

## 2013-02-13 NOTE — Progress Notes (Signed)
Patient blood pressure slightly elevated, takes BP meds at home. Dr. Kerry Hough notified.

## 2013-02-13 NOTE — Progress Notes (Signed)
[facility] PROGRESS NOTE  [name] [name] ZOX:[id number] DOB: November 27, 1945 DOA: 02/12/2013 PCP: Colette Ribas, MD  Assessment/Plan: 1. Partial small bowel obstruction. Appears to be clinically improving with NG tube placement. Due to the recurrent nature of her disease, surgery has elected to take her to the operating room tomorrow. 2. Leukocytosis. Improving with IV fluids and antibiotics. Likely related to #1 3. Transaminitis, likely related to fatty liver, improved 4. Hypertension. Stable continue to follow. Use IV medications when necessary  Code Status: Full code  Family Communication: Discussed with patient Disposition Plan: Discharge home once improved   Consultants:  General surgery  Procedures:  None  Antibiotics:  Zosyn 9/13  HPI/Subjective: Abdominal discomfort has improved after placement of NG tube. She does not have any nausea. She's not had any bowel movements since being admitted. She feels that she is passing flatus.  Objective: Filed Vitals:   02/13/13 0519  BP: 148/79  Pulse: 63  Temp: 97.3 F (36.3 C)  Resp: 20    Intake/Output Summary (Last 24 hours) at 02/13/13 1407 Last data filed at 02/13/13 1302  Gross per 24 hour  Intake   2725 ml  Output   3300 ml  Net   -575 ml   Filed Weights   02/12/13 1404 02/12/13 1843  Weight: 87.091 kg (192 lb) 90 kg (198 lb 6.6 oz)    Exam:   General:  No acute distress  Cardiovascular: S1, S2, regular rate and rhythm  Respiratory: Clear to auscultation bilaterally  Abdomen: Soft, nontender, positive bowel sounds  Musculoskeletal: No pedal edema bilaterally   Data Reviewed: Basic Metabolic Panel:  Recent Labs Lab 02/12/13 1435 02/13/13 0512  NA 136 138  K 3.6 3.2*  CL 98 102  CO2 24 29  GLUCOSE 174* 94  BUN 13 10  CREATININE 0.60 0.65  CALCIUM 9.6 8.5   Liver Function Tests:  Recent Labs Lab 02/12/13 1435 02/13/13 0512  AST 41* 23  ALT 46* 30  ALKPHOS 89 66   BILITOT 1.1 1.0  PROT 7.6 5.9*  ALBUMIN 4.0 3.1*    Recent Labs Lab 02/12/13 1435  LIPASE 35   No results found for this basename: AMMONIA,  in the last 168 hours CBC:  Recent Labs Lab 02/12/13 1435 02/13/13 0512  WBC 19.7* 13.7*  NEUTROABS 16.6*  --   HGB 15.3* 12.3  HCT 45.3 38.4  MCV 90.4 93.0  PLT 303 270   Cardiac Enzymes: No results found for this basename: CKTOTAL, CKMB, CKMBINDEX, TROPONINI,  in the last 168 hours BNP (last 3 results) No results found for this basename: PROBNP,  in the last 8760 hours CBG: No results found for this basename: GLUCAP,  in the last 168 hours  No results found for this or any previous visit (from the past 240 hour(s)).   Studies: Ct Abdomen Pelvis W Contrast  02/12/2013   CLINICAL DATA:  Mid abdominal pain, nausea and vomiting for 2 days, hysterectomy, appendectomy  EXAM: CT ABDOMEN AND PELVIS WITH CONTRAST  TECHNIQUE: Multidetector CT imaging of the abdomen and pelvis was performed using the standard protocol following bolus administration of intravenous contrast.  CONTRAST:  50mL OMNIPAQUE IOHEXOL 300 MG/ML SOLN, OMNIPAQUE IOHEXOL 300 MG/ML SOLN  COMPARISON:  10/07/2012  FINDINGS: Sagittal images of the spine shows diffuse osteopenia. Disc space flattening with mild posterior disc bulge and mild posterior spurring at L5-S1 level. Lung bases are unremarkable.  Mild hepatic fatty infiltration. No focal hepatic mass. No calcified gallstones  are noted within gallbladder.  Kidneys are symmetrical in size and enhancement. Diffuse fatty replacement of the pancreas again noted. The spleen and adrenals are unremarkable.  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  No aortic aneurysm. Delayed renal images shows bilateral renal symmetrical excretion. Small hiatal hernia is noted. There is a proximal gastric diverticulum measures about 2.7 cm.  There are multiple distended small bowel loops throughout the abdomen. Findings are  highly suspicious for at least partial small bowel obstruction. In coronal image 46 there is segmental mild dilatation of distal small bowel up to 2.8 cm. This segment measures about 12 cm in length and containing fecal like material. Beyond this segment the terminal small bowel and terminal ileum are small caliber. The transition point in caliber is best seen in axial image 66.  There is no pericecal inflammation. The transverse colon and distal colon is empty collapsed. Tiny amount of fluid/ stranding is noted in mesenteric fat in right lower abdomen centrally axial image 57. Mild congested central mesenteric vessels.  The urinary bladder is unremarkable.  The patient is status post hysterectomy.  IMPRESSION: 1. There are multiple distended small bowel loops throughout the abdomen. Findings are highly suspicious for at least partial small bowel obstruction. In coronal image 46 there is segmental mild dilatation of distal small bowel up to 2.8 cm. This segment measures about 12 cm in length and containing fecal like material. Beyond this segment the terminal small bowel and terminal ileum are small caliber. The transition point in caliber is best seen in axial image 66. 2. No hydronephrosis or hydroureter. 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 3. Mild hepatic fatty infiltration. 4. Again noted fatty replacement of the pancreas 5. Status post hysterectomy.   Electronically Signed   By: Natasha Mead   On: 02/12/2013 15:49   Portable Chest 1 View  02/12/2013   *RADIOLOGY REPORT*  Clinical Data: Leukocytosis  PORTABLE CHEST - 1 VIEW  Comparison: None.  Findings: Cardiomediastinal silhouette appears normal.  No acute pulmonary disease is noted.  Bony thorax is intact.  IMPRESSION: No acute cardiopulmonary abnormality seen.   Original Report Authenticated By: Lupita Raider.,  M.D.   US Abdomen Limited Ruq  02/13/2013   *RADIOLOGY REPORT*  Clinical Data:  Elevated liver function tests and white  blood count  LIMITED ABDOMINAL ULTRASOUND - RIGHT UPPER QUADRANT  Comparison:  None.  Findings:  Gallbladder:  No stones, wall thickening, or pain on palpation over the gallbladder.  Common bile duct:  Normal at 5 mm  Liver:  Diffusely increased echogenicity.  Stripe of fatty sparing adjacent to the gallbladder. Trace ascites around the right lobe of the liver.  IMPRESSION: Abnormal hepatic echogenicity likely indicative of fatty infiltration.   Trace ascites.                    Original Report Authenticated By: Esperanza Heir, M.D.    Scheduled Meds: . chlorhexidine  1 application Topical Once  . enoxaparin (LOVENOX) injection  40 mg Subcutaneous Q24H  . piperacillin-tazobactam (ZOSYN)  IV  3.375 g Intravenous Q8H  . potassium chloride  10 mEq Intravenous Q1 Hr x 4   Continuous Infusions: . dextrose 5 % and 0.45 % NaCl with KCl 20 mEq/L 100 mL/hr at 02/13/13 1151    Principal Problem:   Partial small bowel obstruction Active Problems:   Fatty liver   Transaminitis   Leukocytosis, unspecified   Nausea with vomiting  Abdominal pain, unspecified site    Time spent:    MEMON,JEHANZEB  [facility] Pager [phone]. If 7PM-7AM, please contact night-coverage at [email or web address], password [facility] 02/13/2013, 2:07 PM  LOS: 1 day

## 2013-02-13 NOTE — Consult Note (Signed)
Reason for Consult: Partial small bowel obstruction Referring Physician: [facility]  [name] [name] is an 67 y.o. female.  HPI: Patient is a 67 year old white female presented emergency room with worsening nausea, vomiting, and abdominal pain. In ER, she had a CT scan of the abdomen and pelvis which revealed a partial small bowel obstruction. She states that since her NG tube has been placed, her abdominal pain has resolved and she feels better. She has not had any flatus or bowel movements. A few months ago. She had multiple episodes that were similar. They did resolve spontaneously.  Past Medical History  Diagnosis Date  . H/O hiatal hernia     Past Surgical History  Procedure Laterality Date  . Appendectomy    . Abdominal hysterectomy    . Colonoscopy  03/18/2012    Procedure: COLONOSCOPY;  Surgeon: Malissa Hippo, MD;  Location: [facility];  Service: Endoscopy;  Laterality: N/A;  930    History reviewed. No pertinent family history.  Social History:  reports that she has never smoked. She does not have any smokeless tobacco history on file. She reports that she does not drink alcohol or use illicit drugs.  Allergies: No Known Allergies  Medications: I have reviewed the patient's current medications.  Results for orders placed during the hospital encounter of 02/12/13 (from the past 48 hour(s))  CBC WITH DIFFERENTIAL     Status: Abnormal   Collection Time    02/12/13  2:35 PM      Result Value Range   WBC 19.7 (*) 4.0 - 10.5 K/uL   RBC 5.01  3.87 - 5.11 MIL/uL   Hemoglobin 15.3 (*) 12.0 - 15.0 g/dL   HCT 45.4  09.8 - 11.9 %   MCV 90.4  78.0 - 100.0 fL   MCH 30.5  26.0 - 34.0 pg   MCHC 33.8  30.0 - 36.0 g/dL   RDW 14.7  82.9 - 56.2 %   Platelets 303  150 - 400 K/uL   Neutrophils Relative % 84 (*) 43 - 77 %   Neutro Abs 16.6 (*) 1.7 - 7.7 K/uL   Lymphocytes Relative 9 (*) 12 - 46 %   Lymphs Abs 1.8  0.7 - 4.0 K/uL   Monocytes Relative 7  3 - 12 %    Monocytes Absolute 1.3 (*) 0.1 - 1.0 K/uL   Eosinophils Relative 0  0 - 5 %   Eosinophils Absolute 0.0  0.0 - 0.7 K/uL   Basophils Relative 0  0 - 1 %   Basophils Absolute 0.0  0.0 - 0.1 K/uL  COMPREHENSIVE METABOLIC PANEL     Status: Abnormal   Collection Time    02/12/13  2:35 PM      Result Value Range   Sodium 136  135 - 145 mEq/L   Potassium 3.6  3.5 - 5.1 mEq/L   Chloride 98  96 - 112 mEq/L   CO2 24  19 - 32 mEq/L   Glucose, Bld 174 (*) 70 - 99 mg/dL   BUN 13  6 - 23 mg/dL   Creatinine, Ser 1.30  0.50 - 1.10 mg/dL   Calcium 9.6  8.4 - 86.5 mg/dL   Total Protein 7.6  6.0 - 8.3 g/dL   Albumin 4.0  3.5 - 5.2 g/dL   AST 41 (*) 0 - 37 U/L   ALT 46 (*) 0 - 35 U/L   Alkaline Phosphatase 89  39 - 117 U/L   Total Bilirubin  1.1  0.3 - 1.2 mg/dL   GFR calc non Af Amer >90  >90 mL/min   GFR calc Af Amer >90  >90 mL/min   Comment: (NOTE)     The eGFR has been calculated using the CKD EPI equation.     This calculation has not been validated in all clinical situations.     eGFR's persistently <90 mL/min signify possible Chronic Kidney     Disease.  LIPASE, BLOOD     Status: None   Collection Time    02/12/13  2:35 PM      Result Value Range   Lipase 35  11 - 59 U/L  CBC     Status: Abnormal   Collection Time    02/13/13  5:12 AM      Result Value Range   WBC 13.7 (*) 4.0 - 10.5 K/uL   RBC 4.13  3.87 - 5.11 MIL/uL   Hemoglobin 12.3  12.0 - 15.0 g/dL   Comment: DELTA CHECK NOTED   HCT 38.4  36.0 - 46.0 %   MCV 93.0  78.0 - 100.0 fL   MCH 29.8  26.0 - 34.0 pg   MCHC 32.0  30.0 - 36.0 g/dL   RDW 19.1  47.8 - 29.5 %   Platelets 270  150 - 400 K/uL  COMPREHENSIVE METABOLIC PANEL     Status: Abnormal   Collection Time    02/13/13  5:12 AM      Result Value Range   Sodium 138  135 - 145 mEq/L   Potassium 3.2 (*) 3.5 - 5.1 mEq/L   Chloride 102  96 - 112 mEq/L   CO2 29  19 - 32 mEq/L   Glucose, Bld 94  70 - 99 mg/dL   BUN 10  6 - 23 mg/dL   Creatinine, Ser 6.21  0.50 - 1.10  mg/dL   Calcium 8.5  8.4 - 30.8 mg/dL   Total Protein 5.9 (*) 6.0 - 8.3 g/dL   Albumin 3.1 (*) 3.5 - 5.2 g/dL   AST 23  0 - 37 U/L   ALT 30  0 - 35 U/L   Alkaline Phosphatase 66  39 - 117 U/L   Total Bilirubin 1.0  0.3 - 1.2 mg/dL   GFR calc non Af Amer >90  >90 mL/min   GFR calc Af Amer >90  >90 mL/min   Comment: (NOTE)     The eGFR has been calculated using the CKD EPI equation.     This calculation has not been validated in all clinical situations.     eGFR's persistently <90 mL/min signify possible Chronic Kidney     Disease.    Ct Abdomen Pelvis W Contrast  02/12/2013   CLINICAL DATA:  Mid abdominal pain, nausea and vomiting for 2 days, hysterectomy, appendectomy  EXAM: CT ABDOMEN AND PELVIS WITH CONTRAST  TECHNIQUE: Multidetector CT imaging of the abdomen and pelvis was performed using the standard protocol following bolus administration of intravenous contrast.  CONTRAST:  50mL OMNIPAQUE IOHEXOL 300 MG/ML SOLN, OMNIPAQUE IOHEXOL 300 MG/ML SOLN  COMPARISON:  10/07/2012  FINDINGS: Sagittal images of the spine shows diffuse osteopenia. Disc space flattening with mild posterior disc bulge and mild posterior spurring at L5-S1 level. Lung bases are unremarkable.  Mild hepatic fatty infiltration. No focal hepatic mass. No calcified gallstones are noted within gallbladder.  Kidneys are symmetrical in size and enhancement. Diffuse fatty replacement of the pancreas again noted. The spleen and adrenals are unremarkable.  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  No aortic aneurysm. Delayed renal images shows bilateral renal symmetrical excretion. Small hiatal hernia is noted. There is a proximal gastric diverticulum measures about 2.7 cm.  There are multiple distended small bowel loops throughout the abdomen. Findings are highly suspicious for at least partial small bowel obstruction. In coronal image 46 there is segmental mild dilatation of distal small bowel up to 2.8 cm.  This segment measures about 12 cm in length and containing fecal like material. Beyond this segment the terminal small bowel and terminal ileum are small caliber. The transition point in caliber is best seen in axial image 66.  There is no pericecal inflammation. The transverse colon and distal colon is empty collapsed. Tiny amount of fluid/ stranding is noted in mesenteric fat in right lower abdomen centrally axial image 57. Mild congested central mesenteric vessels.  The urinary bladder is unremarkable.  The patient is status post hysterectomy.  IMPRESSION: 1. There are multiple distended small bowel loops throughout the abdomen. Findings are highly suspicious for at least partial small bowel obstruction. In coronal image 46 there is segmental mild dilatation of distal small bowel up to 2.8 cm. This segment measures about 12 cm in length and containing fecal like material. Beyond this segment the terminal small bowel and terminal ileum are small caliber. The transition point in caliber is best seen in axial image 66. 2. No hydronephrosis or hydroureter. 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 3. Mild hepatic fatty infiltration. 4. Again noted fatty replacement of the pancreas 5. Status post hysterectomy.   Electronically Signed   By: Natasha Mead   On: 02/12/2013 15:49   Portable Chest 1 View  02/12/2013   *RADIOLOGY REPORT*  Clinical Data: Leukocytosis  PORTABLE CHEST - 1 VIEW  Comparison: None.  Findings: Cardiomediastinal silhouette appears normal.  No acute pulmonary disease is noted.  Bony thorax is intact.  IMPRESSION: No acute cardiopulmonary abnormality seen.   Original Report Authenticated By: Lupita Raider.,  M.D.    ROS: See chart Blood pressure 148/79, pulse 63, temperature 97.3 F (36.3 C), temperature source Oral, resp. rate 20, height 5\' 4"  (1.626 m), weight 90 kg (198 lb 6.6 oz), SpO2 99.00%. Physical Exam: Pleasant white female in no acute distress. Abdomen: Soft,  nontender. NG tube in place. Occasional bowel sounds appreciated. No hernias noted.  Assessment/Plan: Impression: Partial small bowel obstruction, recurrent Plan: As the patient has had recurrent episodes of partial small bowel obstruction, I think an exploratory laparotomy is warranted. I discussed this extensively with the patient, who agrees to the treatment plan. We'll treat her hypokalemia today. Have scheduled her surgery for tomorrow. The risks and benefits of the procedure including bleeding, infection, and the possibility of a bowel resection were fully explained to the patient, who gave informed consent.  Sundeep Destin A 02/13/2013, 10:42 AM

## 2013-02-13 NOTE — Progress Notes (Signed)
Patient's Staph PCR screen positive and BP 161/89. Dr. Kerry Hough notified.

## 2013-02-14 ENCOUNTER — Encounter (HOSPITAL_COMMUNITY)
Admission: EM | Disposition: A | Discharge status: Home / Self Care | Payer: Self-pay | Source: Home / Self Care | Attending: Internal Medicine

## 2013-02-14 ENCOUNTER — Encounter (HOSPITAL_COMMUNITY): Payer: Self-pay | Admitting: *Deleted

## 2013-02-14 ENCOUNTER — Encounter (HOSPITAL_COMMUNITY): Payer: Self-pay | Admitting: Anesthesiology

## 2013-02-14 ENCOUNTER — Inpatient Hospital Stay (HOSPITAL_COMMUNITY): Payer: Medicare Other | Admitting: Anesthesiology

## 2013-02-14 DIAGNOSIS — K7689 Other specified diseases of liver: Secondary | ICD-10-CM

## 2013-02-14 HISTORY — PX: LAPAROTOMY: SHX154

## 2013-02-14 LAB — BASIC METABOLIC PANEL
BUN: 6 mg/dL (ref 6–23)
CO2: 27 mEq/L (ref 19–32)
Chloride: 102 mEq/L (ref 96–112)
Creatinine, Ser: 0.56 mg/dL (ref 0.50–1.10)
GFR calc Af Amer: >90 mL/min (ref >90)
Potassium: 3.1 mEq/L — ABNORMAL LOW (ref 3.5–5.1)

## 2013-02-14 LAB — CBC
HCT: 40.4 % (ref 36.0–46.0)
Hemoglobin: 13.3 g/dL (ref 12.0–15.0)
MCV: 91.8 fL (ref 78.0–100.0)
RDW: 13.3 % (ref 11.5–15.5)
WBC: 11.3 10*3/uL — ABNORMAL HIGH (ref 4.0–10.5)

## 2013-02-14 SURGERY — LAPAROTOMY, EXPLORATORY
Anesthesia: General | Site: Abdomen | Wound class: Clean

## 2013-02-14 MED ORDER — ROCURONIUM BROMIDE 50 MG/5ML IV SOLN
INTRAVENOUS | Status: AC
  Filled 2013-02-14: qty 1

## 2013-02-14 MED ORDER — DEXAMETHASONE SODIUM PHOSPHATE 4 MG/ML IJ SOLN
INTRAMUSCULAR | Status: AC
  Filled 2013-02-14: qty 1

## 2013-02-14 MED ORDER — HYDROCODONE-ACETAMINOPHEN 5-325 MG PO TABS
1.0000 | ORAL_TABLET | Freq: Four times a day (QID) | ORAL | Status: DC | PRN
  Administered 2013-02-15 (×2): 2 via ORAL
  Filled 2013-02-14 (×2): qty 2

## 2013-02-14 MED ORDER — ONDANSETRON HCL 4 MG/2ML IJ SOLN: 4.0000 mg | Freq: Once | INTRAMUSCULAR | Status: DC | PRN

## 2013-02-14 MED ORDER — POTASSIUM CHLORIDE 10 MEQ/100ML IV SOLN
INTRAVENOUS | Status: AC
  Filled 2013-02-14: qty 100

## 2013-02-14 MED ORDER — KETOROLAC TROMETHAMINE 30 MG/ML IJ SOLN
INTRAMUSCULAR | Status: AC
  Filled 2013-02-14: qty 1

## 2013-02-14 MED ORDER — GLYCOPYRROLATE 0.2 MG/ML IJ SOLN
0.2000 mg | Freq: Once | INTRAMUSCULAR | Status: AC
  Administered 2013-02-14: 0.2 mg via INTRAVENOUS

## 2013-02-14 MED ORDER — SUCCINYLCHOLINE CHLORIDE 20 MG/ML IJ SOLN
INTRAMUSCULAR | Status: DC | PRN
  Administered 2013-02-14: 130 mg via INTRAVENOUS

## 2013-02-14 MED ORDER — DEXAMETHASONE SODIUM PHOSPHATE 4 MG/ML IJ SOLN
4.0000 mg | Freq: Once | INTRAMUSCULAR | Status: AC
  Administered 2013-02-14: 4 mg via INTRAVENOUS

## 2013-02-14 MED ORDER — KETOROLAC TROMETHAMINE 30 MG/ML IJ SOLN
30.0000 mg | Freq: Once | INTRAMUSCULAR | Status: AC
  Administered 2013-02-14: 30 mg via INTRAVENOUS

## 2013-02-14 MED ORDER — GLYCOPYRROLATE 0.2 MG/ML IJ SOLN
INTRAMUSCULAR | Status: AC
  Filled 2013-02-14: qty 1

## 2013-02-14 MED ORDER — SUCCINYLCHOLINE CHLORIDE 20 MG/ML IJ SOLN
INTRAMUSCULAR | Status: AC
  Filled 2013-02-14: qty 1

## 2013-02-14 MED ORDER — ROCURONIUM BROMIDE 100 MG/10ML IV SOLN
INTRAVENOUS | Status: DC | PRN
  Administered 2013-02-14: 25 mg via INTRAVENOUS
  Administered 2013-02-14: 5 mg via INTRAVENOUS

## 2013-02-14 MED ORDER — MIDAZOLAM HCL 2 MG/2ML IJ SOLN
INTRAMUSCULAR | Status: AC
  Filled 2013-02-14: qty 2

## 2013-02-14 MED ORDER — POVIDONE-IODINE 10 % EX OINT
TOPICAL_OINTMENT | CUTANEOUS | Status: DC | PRN
  Administered 2013-02-14: 2 via TOPICAL

## 2013-02-14 MED ORDER — PROPOFOL 10 MG/ML IV BOLUS
INTRAVENOUS | Status: DC | PRN
  Administered 2013-02-14: 150 mg via INTRAVENOUS

## 2013-02-14 MED ORDER — FENTANYL CITRATE 0.05 MG/ML IJ SOLN
INTRAMUSCULAR | Status: DC | PRN
  Administered 2013-02-14 (×5): 50 ug via INTRAVENOUS

## 2013-02-14 MED ORDER — GLYCOPYRROLATE 0.2 MG/ML IJ SOLN
INTRAMUSCULAR | Status: DC | PRN
  Administered 2013-02-14: .4 mg via INTRAVENOUS

## 2013-02-14 MED ORDER — SODIUM CHLORIDE 0.9 % IR SOLN
Status: DC | PRN
  Administered 2013-02-14: 2000 mL

## 2013-02-14 MED ORDER — LORAZEPAM 2 MG/ML IJ SOLN
0.5000 mg | Freq: Once | INTRAMUSCULAR | Status: AC
  Administered 2013-02-14: 0.5 mg via INTRAVENOUS
  Filled 2013-02-14: qty 1

## 2013-02-14 MED ORDER — POVIDONE-IODINE 10 % EX OINT
TOPICAL_OINTMENT | CUTANEOUS | Status: AC
  Filled 2013-02-14: qty 2

## 2013-02-14 MED ORDER — POTASSIUM CHLORIDE 10 MEQ/100ML IV SOLN
10.0000 meq | INTRAVENOUS | Status: DC
  Administered 2013-02-14 (×3): 10 meq via INTRAVENOUS
  Filled 2013-02-14 (×3): qty 100

## 2013-02-14 MED ORDER — MORPHINE SULFATE 4 MG/ML IJ SOLN
2.0000 mg | INTRAMUSCULAR | Status: DC | PRN
  Administered 2013-02-14 – 2013-02-15 (×3): 2 mg via INTRAVENOUS
  Filled 2013-02-14 (×3): qty 1

## 2013-02-14 MED ORDER — LIDOCAINE HCL (CARDIAC) 20 MG/ML IV SOLN
INTRAVENOUS | Status: DC | PRN
  Administered 2013-02-14: 40 mg via INTRAVENOUS

## 2013-02-14 MED ORDER — NEOSTIGMINE METHYLSULFATE 1 MG/ML IJ SOLN
INTRAMUSCULAR | Status: DC | PRN
  Administered 2013-02-14: 2 mg via INTRAVENOUS

## 2013-02-14 MED ORDER — EPHEDRINE SULFATE 50 MG/ML IJ SOLN
INTRAMUSCULAR | Status: DC | PRN
  Administered 2013-02-14: 10 mg via INTRAVENOUS

## 2013-02-14 MED ORDER — LIDOCAINE HCL (PF) 1 % IJ SOLN
INTRAMUSCULAR | Status: AC
  Filled 2013-02-14: qty 5

## 2013-02-14 MED ORDER — POTASSIUM CHLORIDE 10 MEQ/100ML IV SOLN
10.0000 meq | INTRAVENOUS | Status: AC
  Administered 2013-02-14 (×4): 10 meq via INTRAVENOUS
  Filled 2013-02-14: qty 100
  Filled 2013-02-14: qty 200
  Filled 2013-02-14: qty 100

## 2013-02-14 MED ORDER — FENTANYL CITRATE 0.05 MG/ML IJ SOLN
INTRAMUSCULAR | Status: AC
  Filled 2013-02-14: qty 5

## 2013-02-14 MED ORDER — ENOXAPARIN SODIUM 40 MG/0.4ML ~~LOC~~ SOLN
40.0000 mg | SUBCUTANEOUS | Status: DC
  Administered 2013-02-15 – 2013-02-17 (×3): 40 mg via SUBCUTANEOUS
  Filled 2013-02-14 (×3): qty 0.4

## 2013-02-14 MED ORDER — ENOXAPARIN SODIUM 40 MG/0.4ML ~~LOC~~ SOLN
SUBCUTANEOUS | Status: AC
  Filled 2013-02-14: qty 0.4

## 2013-02-14 MED ORDER — LACTATED RINGERS IV SOLN
INTRAVENOUS | Status: DC
  Administered 2013-02-14 (×2): via INTRAVENOUS

## 2013-02-14 MED ORDER — FENTANYL CITRATE 0.05 MG/ML IJ SOLN
INTRAMUSCULAR | Status: AC
  Filled 2013-02-14: qty 2

## 2013-02-14 MED ORDER — ONDANSETRON HCL 4 MG/2ML IJ SOLN
INTRAMUSCULAR | Status: AC
  Filled 2013-02-14: qty 2

## 2013-02-14 MED ORDER — PROPOFOL 10 MG/ML IV EMUL
INTRAVENOUS | Status: AC
  Filled 2013-02-14: qty 20

## 2013-02-14 MED ORDER — FENTANYL CITRATE 0.05 MG/ML IJ SOLN
25.0000 ug | INTRAMUSCULAR | Status: DC | PRN
  Administered 2013-02-14 (×3): 50 ug via INTRAVENOUS

## 2013-02-14 MED ORDER — ONDANSETRON HCL 4 MG/2ML IJ SOLN
4.0000 mg | Freq: Once | INTRAMUSCULAR | Status: AC
  Administered 2013-02-14: 4 mg via INTRAVENOUS

## 2013-02-14 MED ORDER — MIDAZOLAM HCL 2 MG/2ML IJ SOLN
1.0000 mg | INTRAMUSCULAR | Status: DC | PRN
  Administered 2013-02-14 (×2): 2 mg via INTRAVENOUS

## 2013-02-14 MED ORDER — LACTATED RINGERS IV SOLN
INTRAVENOUS | Status: DC
  Administered 2013-02-14: 13:00:00 via INTRAVENOUS
  Administered 2013-02-14: 1000 mL via INTRAVENOUS

## 2013-02-14 SURGICAL SUPPLY — 29 items
BAG HAMPER (MISCELLANEOUS) ×2 IMPLANT
CLOTH BEACON ORANGE TIMEOUT ST (SAFETY) ×2 IMPLANT
COVER LIGHT HANDLE STERIS (MISCELLANEOUS) ×4 IMPLANT
DRAPE WARM FLUID 44X44 (DRAPE) ×2 IMPLANT
DRSG OPSITE POSTOP 4X10 (GAUZE/BANDAGES/DRESSINGS) ×2 IMPLANT
DURAPREP 26ML APPLICATOR (WOUND CARE) ×2 IMPLANT
ELECT REM PT RETURN 9FT ADLT (ELECTROSURGICAL) ×2
ELECTRODE REM PT RTRN 9FT ADLT (ELECTROSURGICAL) ×1 IMPLANT
GLOVE BIO SURGEON STRL SZ7.5 (GLOVE) ×5 IMPLANT
GLOVE ECLIPSE 7.0 STRL STRAW (GLOVE) ×2 IMPLANT
GLOVE INDICATOR 7.0 STRL GRN (GLOVE) ×3 IMPLANT
GLOVE INDICATOR 7.5 STRL GRN (GLOVE) ×1 IMPLANT
GLOVE SS BIOGEL STRL SZ 6.5 (GLOVE) IMPLANT
GLOVE SUPERSENSE BIOGEL SZ 6.5 (GLOVE) ×2
GOWN STRL REIN XL XLG (GOWN DISPOSABLE) ×9 IMPLANT
INST SET MAJOR GENERAL (KITS) ×2 IMPLANT
KIT ROOM TURNOVER APOR (KITS) ×2 IMPLANT
MANIFOLD NEPTUNE II (INSTRUMENTS) ×2 IMPLANT
NS IRRIG 1000ML POUR BTL (IV SOLUTION) ×2 IMPLANT
PACK ABDOMINAL MAJOR (CUSTOM PROCEDURE TRAY) ×2 IMPLANT
PAD ARMBOARD 7.5X6 YLW CONV (MISCELLANEOUS) ×2 IMPLANT
PENCIL HANDSWITCHING (ELECTRODE) ×1 IMPLANT
SET BASIN LINEN APH (SET/KITS/TRAYS/PACK) ×2 IMPLANT
SPONGE GAUZE 4X4 12PLY (GAUZE/BANDAGES/DRESSINGS) ×2 IMPLANT
SPONGE LAP 18X18 X RAY DECT (DISPOSABLE) ×2 IMPLANT
STAPLER VISISTAT (STAPLE) ×2 IMPLANT
SUCTION POOLE TIP (SUCTIONS) ×2 IMPLANT
SUT PDS AB 0 CTX 60 (SUTURE) ×2 IMPLANT
TRAY FOLEY CATH 16FR SILVER (SET/KITS/TRAYS/PACK) ×2 IMPLANT

## 2013-02-14 NOTE — Op Note (Addendum)
Patient:  [name] [name]  DOB:  Sep 23, 1945  MRN:  [id number]   Preop Diagnosis:  Small bowel obstruction  Postop Diagnosis:  Same, adhesive disease  Procedure:  Exploratory laparotomy  Surgeon:  Franky Macho, M.D.  Anes:  Gen. endotracheal  Indications:  Patient is a 67 year old white female who presents with several episodes of nausea, vomiting, abdominal pain. CT scan of the abdomen reveals a partial small bowel traction in the lower portion of the small intestine. The risks and benefits of the procedure including bleeding, infection, and a possibly recurrence of the obstruction were fully extended the patient, who gave informed consent.  Procedure note:  The patient is placed in the supine position. After induction of general endotracheal anesthesia, the abdomen was prepped and draped using usual sterile technique with DuraPrep. Surgical site confirmation was performed.  A midline incision was made from just above the umbilicus to the umbilicus level. The peritoneal cavity was entered into without difficulty. Beginning at the ligament of Treitz, the small bowel was then eviscerated from the abdominal cavity. Approximately 2/3 down the small intestine, and the area of stricture or was noted circumferentially around the small intestine. Some bruising was also noted in the wall of the small bowel. Good peristalsis on either side was noted. I proceeded with running the bowel to the terminal ileum. On inspection back at the abnormal area, there was another area that was just distal to this was somewhat bruised but not strictured. The original area was inspected and no ischemic changes were noted. The lumen was noted to be patent. There was some hypertrophy of the musculature on either side of this stricture. I suspect patient had an internal hernia. She has had multiple abdominal surgeries in the past. The bowel was returned into the abdominal cavity in an orderly fashion. The fascia was  reapproximated using a looped 0 PDS running suture. Subcutaneous layer was irrigated normal saline and the skin was closed using staples. Betadine ointment and dressed a dressings were applied.  All tape and needle counts were correct at the end of the procedure. Patient was extubated in the operating room and transferred to PACU in stable condition.  Complications:  None  EBL:  Minimal  Specimen:  None    Addendum: The liver was inspected and noted within normal limits. The gallbladder was noted to be somewhat distended, but no large gallstones were palpable.

## 2013-02-14 NOTE — Progress Notes (Signed)
[facility] PROGRESS NOTE  [name] [name] EAV:[id number] DOB: Oct 16, 1945 DOA: 02/12/2013 PCP: Colette Ribas, MD  Assessment/Plan: 1. Partial small bowel obstruction. 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 NG tube has been removed. She reports that she is passing flatus. She's been placed on clear liquids. Advance diet as tolerated. 2. Leukocytosis. Improving with IV fluids and antibiotics. Likely related to #1 3. Transaminitis, likely related to fatty liver, improved 4. Hypertension. Stable continue to follow. Use IV medications when necessary 5. Hypokalemia. Replace  Code Status: Full code  Family Communication: Discussed with patient Disposition Plan: Discharge home once improved   Consultants:  General surgery  Procedures: Exploratory laparotomy on 9/15 by Dr. Lovell Sheehan   Antibiotics:  Zosyn 9/13  HPI/Subjective: Patient seen in room, postoperatively. She is complaining of soreness in her abdomen. Overall she is feeling better.  Objective: Filed Vitals:   02/14/13 1550  BP: 102/58  Pulse: 95  Temp:   Resp:     Intake/Output Summary (Last 24 hours) at 02/14/13 1716 Last data filed at 02/14/13 1329  Gross per 24 hour  Intake 2636.67 ml  Output   3970 ml  Net -1333.33 ml   Filed Weights   02/12/13 1404 02/12/13 1843  Weight: 87.091 kg (192 lb) 90 kg (198 lb 6.6 oz)    Exam:   General:  No acute distress  Cardiovascular: S1, S2, regular rate and rhythm  Respiratory: Clear to auscultation bilaterally  Abdomen: Soft, nontender, positive bowel sounds  Musculoskeletal: No pedal edema bilaterally   Data Reviewed: Basic Metabolic Panel:  Recent Labs Lab 02/12/13 1435 02/13/13 0512 02/14/13 0535  NA 136 138 138  K 3.6 3.2* 3.1*  CL 98 102 102  CO2 24 29 27   GLUCOSE 174* 94 107*  BUN 13 10 6   CREATININE 0.60 0.65 0.56  CALCIUM 9.6 8.5 8.9   Liver Function Tests:  Recent Labs Lab 02/12/13 1435  02/13/13 0512  AST 41* 23  ALT 46* 30  ALKPHOS 89 66  BILITOT 1.1 1.0  PROT 7.6 5.9*  ALBUMIN 4.0 3.1*    Recent Labs Lab 02/12/13 1435  LIPASE 35   No results found for this basename: AMMONIA,  in the last 168 hours CBC:  Recent Labs Lab 02/12/13 1435 02/13/13 0512 02/14/13 0535  WBC 19.7* 13.7* 11.3*  NEUTROABS 16.6*  --   --   HGB 15.3* 12.3 13.3  HCT 45.3 38.4 40.4  MCV 90.4 93.0 91.8  PLT 303 270 260   Cardiac Enzymes: No results found for this basename: CKTOTAL, CKMB, CKMBINDEX, TROPONINI,  in the last 168 hours BNP (last 3 results) No results found for this basename: PROBNP,  in the last 8760 hours CBG: No results found for this basename: GLUCAP,  in the last 168 hours  Recent Results (from the past 240 hour(s))  SURGICAL PCR SCREEN     Status: Abnormal   Collection Time    02/13/13  1:54 PM      Result Value Range Status   MRSA, PCR NEGATIVE  NEGATIVE Final   Staphylococcus aureus POSITIVE (*) NEGATIVE Final   Comment: RESULT CALLED TO, READ BACK BY AND VERIFIED WITH:     KNIGHT C. AT 1530 ON [id number] BY THOMPSON S.                The Xpert SA Assay (FDA     approved for NASAL specimens     in patients over 38 years of age),  is one component of     a comprehensive surveillance     program.  Test performance has     been validated by [facility] for patients greater     than or equal to 52 year old.     It is not intended     to diagnose infection nor to     guide or monitor treatment.     Studies: Portable Chest 1 View  02/12/2013   *RADIOLOGY REPORT*  Clinical Data: Leukocytosis  PORTABLE CHEST - 1 VIEW  Comparison: None.  Findings: Cardiomediastinal silhouette appears normal.  No acute pulmonary disease is noted.  Bony thorax is intact.  IMPRESSION: No acute cardiopulmonary abnormality seen.   Original Report Authenticated By: Lupita Raider.,  M.D.   US Abdomen Limited Ruq  02/13/2013   *RADIOLOGY REPORT*  Clinical Data:  Elevated  liver function tests and white blood count  LIMITED ABDOMINAL ULTRASOUND - RIGHT UPPER QUADRANT  Comparison:  None.  Findings:  Gallbladder:  No stones, wall thickening, or pain on palpation over the gallbladder.  Common bile duct:  Normal at 5 mm  Liver:  Diffusely increased echogenicity.  Stripe of fatty sparing adjacent to the gallbladder. Trace ascites around the right lobe of the liver.  IMPRESSION: Abnormal hepatic echogenicity likely indicative of fatty infiltration.   Trace ascites.                    Original Report Authenticated By: Esperanza Heir, M.D.    Scheduled Meds: . Chlorhexidine Gluconate Cloth  6 each Topical Daily  . [START ON 02/15/2013] enoxaparin (LOVENOX) injection  40 mg Subcutaneous Q24H  . mupirocin ointment  1 application Nasal BID  . piperacillin-tazobactam (ZOSYN)  IV  3.375 g Intravenous Q8H   Continuous Infusions: . lactated ringers 100 mL/hr at 02/14/13 1601    Principal Problem:   Partial small bowel obstruction Active Problems:   Fatty liver   Transaminitis   Leukocytosis, unspecified   Nausea with vomiting   Abdominal pain, unspecified site    Time spent:    [facility]  [facility] Pager [phone]. If 7PM-7AM, please contact night-coverage at [email or web address], password [facility] 02/14/2013, 5:16 PM  LOS: 2 days

## 2013-02-14 NOTE — Anesthesia Preprocedure Evaluation (Signed)
Anesthesia Evaluation  Patient identified by MRN, date of birth, ID band Patient awake    Reviewed: Allergy & Precautions, H&P , NPO status , Patient's Chart, lab work & pertinent test results  Airway Mallampati: III TM Distance: <3 FB Neck ROM: Full  Mouth opening: Limited Mouth Opening  Dental  (+) Teeth Intact and Dental Advisory Given   Pulmonary neg pulmonary ROS,  breath sounds clear to auscultation        Cardiovascular hypertension, Pt. on medications Rhythm:Regular Rate:Normal     Neuro/Psych    GI/Hepatic hiatal hernia, GERD-  Medicated and Controlled,  Endo/Other    Renal/GU      Musculoskeletal   Abdominal   Peds  Hematology   Anesthesia Other Findings   Reproductive/Obstetrics                           Anesthesia Physical Anesthesia Plan  ASA: II  Anesthesia Plan: General   Post-op Pain Management:    Induction: Intravenous, Rapid sequence and Cricoid pressure planned  Airway Management Planned: Oral ETT and Video Laryngoscope Planned  Additional Equipment:   Intra-op Plan:   Post-operative Plan: Extubation in OR  Informed Consent: I have reviewed the patients History and Physical, chart, labs and discussed the procedure including the risks, benefits and alternatives for the proposed anesthesia with the patient or authorized representative who has indicated his/her understanding and acceptance.     Plan Discussed with:   Anesthesia Plan Comments:         Anesthesia Quick Evaluation

## 2013-02-14 NOTE — Anesthesia Postprocedure Evaluation (Addendum)
  Anesthesia Post-op Note  Patient: [name] [name]  Procedure(s) Performed: Procedure(s): EXPLORATORY LAPAROTOMY (N/A)  Patient Location: PACU  Anesthesia Type:General  Level of Consciousness: awake, alert  and oriented  Airway and Oxygen Therapy: Patient Spontanous Breathing and Patient connected to face mask oxygen  Post-op Pain: mild  Post-op Assessment: Post-op Vital signs reviewed, Patient's Cardiovascular Status Stable, Respiratory Function Stable, Patent Airway and No signs of Nausea or vomiting  Post-op Vital Signs: Reviewed and stable, 132/87, 97, 10, sat 96, 36.8  Complications: No apparent anesthesia complications 02/15/13  Mrs. Keinath is doing very well, VSS, No apparent anesthesia complications.

## 2013-02-14 NOTE — Transfer of Care (Signed)
Immediate Anesthesia Transfer of Care Note  Patient: [name] [name]  Procedure(s) Performed: Procedure(s): EXPLORATORY LAPAROTOMY (N/A)  Patient Location: PACU  Anesthesia Type:General  Level of Consciousness: awake  Airway & Oxygen Therapy: Patient Spontanous Breathing and Patient connected to face mask oxygen  Post-op Assessment: Report given to PACU RN  Post vital signs: Reviewed and stable  Complications: No apparent anesthesia complications

## 2013-02-14 NOTE — Anesthesia Procedure Notes (Signed)
Procedure Name: Intubation Date/Time: 02/14/2013 12:26 PM Performed by: Glynn Octave E Pre-anesthesia Checklist: Patient identified, Patient being monitored, Timeout performed, Emergency Drugs available and Suction available Patient Re-evaluated:Patient Re-evaluated prior to inductionOxygen Delivery Method: Circle System Utilized Preoxygenation: Pre-oxygenation with 100% oxygen Intubation Type: IV induction, Rapid sequence and Cricoid Pressure applied Grade View: Grade II Tube type: Oral Tube size: 7.0 mm Number of attempts: 1 Airway Equipment and Method: stylet and Video-laryngoscopy Placement Confirmation: ETT inserted through vocal cords under direct vision,  positive ETCO2 and breath sounds checked- equal and bilateral Secured at: 21 cm Tube secured with: Tape Dental Injury: Teeth and Oropharynx as per pre-operative assessment  Comments: Dr. Jayme Cloud assisted with intubation.  Good view, small mouth and some difficulty directing ETT down and through vocal cords.

## 2013-02-15 ENCOUNTER — Encounter (HOSPITAL_COMMUNITY): Payer: Self-pay | Admitting: General Surgery

## 2013-02-15 DIAGNOSIS — R112 Nausea with vomiting, unspecified: Secondary | ICD-10-CM

## 2013-02-15 LAB — BASIC METABOLIC PANEL
BUN: 9 mg/dL (ref 6–23)
CO2: 25 mEq/L (ref 19–32)
Calcium: 8.9 mg/dL (ref 8.4–10.5)
Chloride: 101 mEq/L (ref 96–112)
Creatinine, Ser: 0.74 mg/dL (ref 0.50–1.10)
GFR calc non Af Amer: 87 mL/min — ABNORMAL LOW (ref >90)
Potassium: 3.6 mEq/L (ref 3.5–5.1)
Sodium: 136 mEq/L (ref 135–145)

## 2013-02-15 LAB — CBC
HCT: 38.6 % (ref 36.0–46.0)
MCH: 30.5 pg (ref 26.0–34.0)
MCHC: 32.9 g/dL (ref 30.0–36.0)
MCV: 92.6 fL (ref 78.0–100.0)
RDW: 13.7 % (ref 11.5–15.5)

## 2013-02-15 MED ORDER — KCL IN DEXTROSE-NACL 20-5-0.45 MEQ/L-%-% IV SOLN
INTRAVENOUS | Status: DC
  Administered 2013-02-15: 08:00:00 via INTRAVENOUS

## 2013-02-15 MED ORDER — MAGNESIUM HYDROXIDE 400 MG/5ML PO SUSP
30.0000 mL | Freq: Two times a day (BID) | ORAL | Status: DC
  Administered 2013-02-15 – 2013-02-16 (×4): 30 mL via ORAL
  Filled 2013-02-15 (×4): qty 30

## 2013-02-15 NOTE — Care Management Note (Signed)
    Page 1 of 1   02/15/2013     1:13:32 PM   CARE MANAGEMENT NOTE 02/15/2013  Patient:  [name] [name], [name] [name]   Account Number:  [id number]  Date Initiated:  02/15/2013  Documentation initiated by:  Rosemary Holms  Subjective/Objective Assessment:   pt admitted from home. Surgery but no needs identified.     Action/Plan:   Anticipated DC Date:  02/15/2013   Anticipated DC Plan:  HOME/SELF CARE      DC Planning Services  CM consult      Choice offered to / List presented to:             Status of service:  Completed, signed off Medicare Important Message given?  YES (If response is "NO", the following Medicare IM given date fields will be blank) Date Medicare IM given:  02/15/2013 Date Additional Medicare IM given:    Discharge Disposition:    Per UR Regulation:    If discussed at Long Length of Stay Meetings, dates discussed:    Comments:  02/15/13 Rosemary Holms RN BSN CM

## 2013-02-15 NOTE — Progress Notes (Signed)
Utilization Review Complete  

## 2013-02-15 NOTE — Progress Notes (Signed)
[facility] PROGRESS NOTE  [name] [name] ZOX:[id number] DOB: [birth year] DOA: 02/12/2013 PCP: Colette Ribas, MD  Assessment/Plan: 1. Partial small bowel obstruction. 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 NG tube has been removed. She reports that she is passing flatus, but has not moved her bowels. Await return of bowel function. Her diet has been advanced to full liquid. Anticipate that she may be able to try solid food tomorrow. She does not have any nausea, abdominal pain is controlled. 2. Leukocytosis. Improving with IV fluids and antibiotics. Likely related to #1 3. Transaminitis, likely related to fatty liver, improved 4. Hypertension. Stable continue to follow. She is currently normotensive. Reintroduce antihypertensives as blood pressure tolerates. 5. Hypokalemia. Replace  Code Status: Full code  Family Communication: Discussed with patient Disposition Plan: Discharge home once improved   Consultants:  General surgery  Procedures: Exploratory laparotomy on 9/15 by Dr. Lovell Sheehan   Antibiotics:  Zosyn 9/13  HPI/Subjective: Has not had a bowel movement. She is passing flatus. Patient is ambulating in the halls. Denies any nausea or vomiting.  Objective: Filed Vitals:   02/15/13 1414  BP: 118/80  Pulse: 91  Temp: 98.6 F (37 C)  Resp: 18    Intake/Output Summary (Last 24 hours) at 02/15/13 2007 Last data filed at 02/15/13 1816  Gross per 24 hour  Intake   1440 ml  Output   2700 ml  Net  -1260 ml   Filed Weights   02/12/13 1404 02/12/13 1843  Weight: 87.091 kg (192 lb) 90 kg (198 lb 6.6 oz)    Exam:   General:  No acute distress  Cardiovascular: S1, S2, regular rate and rhythm  Respiratory: Clear to auscultation bilaterally  Abdomen: Soft, diffusely tender, bowel sounds are sluggish  Musculoskeletal: No pedal edema bilaterally   Data Reviewed: Basic Metabolic Panel:  Recent Labs Lab 02/12/13 1435  02/13/13 0512 02/14/13 0535 02/15/13 0548  NA 136 138 138 136  K 3.6 3.2* 3.1* 3.6  CL 98 102 102 101  CO2 24 29 27 25   GLUCOSE 174* 94 107* 103*  BUN 13 10 6 9   CREATININE 0.60 0.65 0.56 0.74  CALCIUM 9.6 8.5 8.9 8.9  MG  --   --   --  2.0  PHOS  --   --   --  4.4   Liver Function Tests:  Recent Labs Lab 02/12/13 1435 02/13/13 0512  AST 41* 23  ALT 46* 30  ALKPHOS 89 66  BILITOT 1.1 1.0  PROT 7.6 5.9*  ALBUMIN 4.0 3.1*    Recent Labs Lab 02/12/13 1435  LIPASE 35   No results found for this basename: AMMONIA,  in the last 168 hours CBC:  Recent Labs Lab 02/12/13 1435 02/13/13 0512 02/14/13 0535 02/15/13 0548  WBC 19.7* 13.7* 11.3* 14.6*  NEUTROABS 16.6*  --   --   --   HGB 15.3* 12.3 13.3 12.7  HCT 45.3 38.4 40.4 38.6  MCV 90.4 93.0 91.8 92.6  PLT 303 270 260 271   Cardiac Enzymes: No results found for this basename: CKTOTAL, CKMB, CKMBINDEX, TROPONINI,  in the last 168 hours BNP (last 3 results) No results found for this basename: PROBNP,  in the last 8760 hours CBG: No results found for this basename: GLUCAP,  in the last 168 hours  Recent Results (from the past 240 hour(s))  SURGICAL PCR SCREEN     Status: Abnormal   Collection Time    02/13/13  1:54 PM  Result Value Range Status   MRSA, PCR NEGATIVE  NEGATIVE Final   Staphylococcus aureus POSITIVE (*) NEGATIVE Final   Comment: RESULT CALLED TO, READ BACK BY AND VERIFIED WITH:     KNIGHT C. AT 1530 ON [id number] BY THOMPSON S.                The Xpert SA Assay (FDA     approved for NASAL specimens     in patients over 46 years of age),     is one component of     a comprehensive surveillance     program.  Test performance has     been validated by [employer] for patients greater     than or equal to 65 year old.     It is not intended     to diagnose infection nor to     guide or monitor treatment.     Studies: No results found.  Scheduled Meds: . Chlorhexidine Gluconate  Cloth  6 each Topical Daily  . enoxaparin (LOVENOX) injection  40 mg Subcutaneous Q24H  . magnesium hydroxide  30 mL Oral BID  . mupirocin ointment  1 application Nasal BID  . piperacillin-tazobactam (ZOSYN)  IV  3.375 g Intravenous Q8H   Continuous Infusions: . dextrose 5 % and 0.45 % NaCl with KCl 20 mEq/L 75 mL/hr at 02/15/13 4696    Principal Problem:   Partial small bowel obstruction Active Problems:   Fatty liver   Transaminitis   Leukocytosis, unspecified   Nausea with vomiting   Abdominal pain, unspecified site    Time spent:    [facility]  [facility] Pager [phone]. If 7PM-7AM, please contact night-coverage at [email or web address], password [facility] 02/15/2013, 8:07 PM  LOS: 3 days

## 2013-02-15 NOTE — Progress Notes (Signed)
1 Day Post-Op  Subjective: Minimal incisional pain. Preoperative pain resolved. No flatus or bowel movement yet.  Objective: Vital signs in last 24 hours: Temp:  [97.7 F (36.5 C)-98.5 F (36.9 C)] 97.9 F (36.6 C) (09/16 0211) Pulse Rate:  [73-102] 85 (09/16 0211) Resp:  [9-25] 16 (09/16 0211) BP: (97-162)/(52-90) 104/58 mmHg (09/16 0211) SpO2:  [92 %-100 %] 97 % (09/16 0211) Last BM Date: 02/14/13  Intake/Output from previous day: 09/15 0701 - 09/16 0700 In: 1780 [P.O.:580; I.V.:1200] Out: 3020 [Urine:2200; Stool:800; Blood:20] Intake/Output this shift:    General appearance: alert, cooperative and no distress Resp: clear to auscultation bilaterally Cardio: regular rate and rhythm, S1, S2 normal, no murmur, click, rub or gallop GI: Soft. Minimal bowel sounds appreciated. Incision healing well.  Lab Results:   Recent Labs  02/14/13 0535 02/15/13 0548  WBC 11.3* 14.6*  HGB 13.3 12.7  HCT 40.4 38.6  PLT 260 271   BMET  Recent Labs  02/14/13 0535 02/15/13 0548  NA 138 136  K 3.1* 3.6  CL 102 101  CO2 27 25  GLUCOSE 107* 103*  BUN 6 9  CREATININE 0.56 0.74  CALCIUM 8.9 8.9   PT/INR No results found for this basename: LABPROT, INR,  in the last 72 hours  Studies/Results: US Abdomen Limited Ruq  02/13/2013   *RADIOLOGY REPORT*  Clinical Data:  Elevated liver function tests and white blood count  LIMITED ABDOMINAL ULTRASOUND - RIGHT UPPER QUADRANT  Comparison:  None.  Findings:  Gallbladder:  No stones, wall thickening, or pain on palpation over the gallbladder.  Common bile duct:  Normal at 5 mm  Liver:  Diffusely increased echogenicity.  Stripe of fatty sparing adjacent to the gallbladder. Trace ascites around the right lobe of the liver.  IMPRESSION: Abnormal hepatic echogenicity likely indicative of fatty infiltration.   Trace ascites.                    Original Report Authenticated By: Esperanza Heir, M.D.    Anti-infectives: Anti-infectives   Start      Dose/Rate Route Frequency Ordered Stop   02/12/13 2000  piperacillin-tazobactam (ZOSYN) IVPB 3.375 g     3.375 g 12.5 mL/hr over 240 Minutes Intravenous Every 8 hours 02/12/13 1917     02/12/13 1700  Ampicillin-Sulbactam (UNASYN) 3 g in sodium chloride 0.9 % 100 mL IVPB     3 g 100 mL/hr over 60 Minutes Intravenous  Once 02/12/13 1656 02/12/13 1846      Assessment/Plan: s/p Procedure(s): EXPLORATORY LAPAROTOMY Impression: Stable, postoperative day one. Awaiting return of bowel function. Leukocytosis secondary to recent surgery. Hypokalemia resolved. Plan: Will advance diet as tolerated. Patient may be switched to antihypertensives as per hospitalist. We'll start to ambulate patient.  LOS: 3 days    Camauri Fleece A 02/15/2013

## 2013-02-16 LAB — CBC
Platelets: 297 10*3/uL (ref 150–400)
RBC: 4.31 MIL/uL (ref 3.87–5.11)
RDW: 13.5 % (ref 11.5–15.5)
WBC: 13.1 10*3/uL — ABNORMAL HIGH (ref 4.0–10.5)

## 2013-02-16 LAB — BASIC METABOLIC PANEL
CO2: 30 mEq/L (ref 19–32)
Chloride: 97 mEq/L (ref 96–112)
GFR calc Af Amer: >90 mL/min (ref >90)
Potassium: 3.1 mEq/L — ABNORMAL LOW (ref 3.5–5.1)
Sodium: 136 mEq/L (ref 135–145)

## 2013-02-16 LAB — MAGNESIUM: Magnesium: 2.3 mg/dL (ref 1.5–2.5)

## 2013-02-16 LAB — PHOSPHORUS: Phosphorus: 3.4 mg/dL (ref 2.3–4.6)

## 2013-02-16 MED ORDER — PANTOPRAZOLE SODIUM 40 MG PO TBEC
40.0000 mg | DELAYED_RELEASE_TABLET | Freq: Every day | ORAL | Status: DC
  Administered 2013-02-16 – 2013-02-17 (×2): 40 mg via ORAL
  Filled 2013-02-16 (×2): qty 1

## 2013-02-16 MED ORDER — POTASSIUM CHLORIDE 10 MEQ/100ML IV SOLN
10.0000 meq | INTRAVENOUS | Status: AC
  Administered 2013-02-16 (×3): 10 meq via INTRAVENOUS
  Filled 2013-02-16 (×3): qty 100

## 2013-02-16 MED ORDER — POLYETHYLENE GLYCOL 3350 17 G PO PACK
17.0000 g | PACK | Freq: Every day | ORAL | Status: DC
  Administered 2013-02-16: 17 g via ORAL
  Filled 2013-02-16 (×2): qty 1

## 2013-02-16 MED ORDER — ALUM & MAG HYDROXIDE-SIMETH 200-200-20 MG/5ML PO SUSP
15.0000 mL | Freq: Four times a day (QID) | ORAL | Status: DC | PRN
  Administered 2013-02-16 – 2013-02-17 (×3): 15 mL via ORAL
  Filled 2013-02-16 (×4): qty 30

## 2013-02-16 MED ORDER — ASPIRIN EC 81 MG PO TBEC
81.0000 mg | DELAYED_RELEASE_TABLET | Freq: Every day | ORAL | Status: DC
  Administered 2013-02-16 – 2013-02-17 (×2): 81 mg via ORAL
  Filled 2013-02-16 (×2): qty 1

## 2013-02-16 MED ORDER — ATORVASTATIN CALCIUM 10 MG PO TABS
10.0000 mg | ORAL_TABLET | Freq: Every day | ORAL | Status: DC
  Administered 2013-02-16: 10 mg via ORAL
  Filled 2013-02-16: qty 1

## 2013-02-16 MED ORDER — POTASSIUM CHLORIDE 10 MEQ/100ML IV SOLN
10.0000 meq | Freq: Once | INTRAVENOUS | Status: AC
  Administered 2013-02-16: 10 meq via INTRAVENOUS
  Filled 2013-02-16: qty 100

## 2013-02-16 NOTE — Progress Notes (Signed)
Patient had one episode of emesis. She drank a ginger ale and walked the unit and returned to the room feeling nauseous. She vomited once and I gave her zofran. She states she is "feeling better". I will continue to monitor her symptoms.

## 2013-02-16 NOTE — Progress Notes (Signed)
[facility] PROGRESS NOTE  [name] [name] ZOX:[id number] DOB: April 04, 1946 DOA: 02/12/2013 PCP: Colette Ribas, MD  Assessment/Plan: 1. Partial small bowel obstruction. 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 And management Diet per general surgery.  2. Leukocytosis. Stable. Likely related to #1/acute illness 3. Transaminitis, likely related to fatty liver, stable 4. Hypertension. Stable. Reintroduce antihypertensives as blood pressure tolerates. 5. Hypokalemia. Replete   Replete potassium  Can likely discontinue antibiotics 9/18  Pending studies:     Code Status: full code DVT prophylaxis: Lovenox Family Communication: none present Disposition Plan: home per surgery  Brendia Sacks, MD  [facility]  Pager [phone] If 7PM-7AM, please contact night-coverage at [email or web address], password [facility] 02/16/2013, 3:20 PM  LOS: 4 days   Summary: 67 year old woman presented to the emergency department with intractable vomiting and abdominal pain. CT scan revealed partial small bowel obstruction the patient was admitted for further evaluation. She was seen by general surgery and underwent exploratory laparotomy 9/15.  Consultants:  General surgery  Procedures:  9/15 exploratory laparotomy  Antibiotics:  Zosyn 9/13 >>   HPI/Subjective: Feels well. Had some vomiting early this morning but none since. Ambulating. Tolerating liquids.  Objective: Filed Vitals:   02/15/13 1414 02/15/13 2105 02/16/13 0554 02/16/13 1356  BP: 118/80 139/69 116/67 138/80  Pulse: 91 79 77 90  Temp: 98.6 F (37 C) 98 F (36.7 C) 98.6 F (37 C) 98.2 F (36.8 C)  TempSrc: Oral Oral Oral   Resp: 18 16 18 18   Height:      Weight:      SpO2: 92% 94% 88% 95%    Intake/Output Summary (Last 24 hours) at 02/16/13 1520 Last data filed at 02/16/13 0900  Gross per 24 hour  Intake    840 ml  Output      1 ml  Net    839 ml     Filed Weights   02/12/13  1404 02/12/13 1843  Weight: 87.091 kg (192 lb) 90 kg (198 lb 6.6 oz)    Exam:   Afebrile, vital signs stable  General: Appears calm and comfortable.  Psychiatric: Grossly normal mood and affect. Speech fluent and appropriate.  Cardiovascular: Regular rate and rhythm. No murmur, rub, gallop. No lower extremity edema.  Respiratory: Clear to auscultation bilaterally. No wheezes, rales, rhonchi. Normal respiratory effort.  Data Reviewed:  Potassium 3.1  CBC with persistent leukocytosis 13.1  Scheduled Meds: . Chlorhexidine Gluconate Cloth  6 each Topical Daily  . enoxaparin (LOVENOX) injection  40 mg Subcutaneous Q24H  . magnesium hydroxide  30 mL Oral BID  . mupirocin ointment  1 application Nasal BID  . pantoprazole  40 mg Oral Daily  . piperacillin-tazobactam (ZOSYN)  IV  3.375 g Intravenous Q8H  . polyethylene glycol  17 g Oral Daily   Continuous Infusions: . dextrose 5 % and 0.45 % NaCl with KCl 20 mEq/L 75 mL/hr at 02/15/13 8295    Principal Problem:   Partial small bowel obstruction Active Problems:   Fatty liver   Transaminitis   Leukocytosis, unspecified   Nausea with vomiting   Abdominal pain, unspecified site   Time spent 20 minutes

## 2013-02-16 NOTE — Progress Notes (Signed)
2 Days Post-Op  Subjective: 1 episode of nausea, but feels better. Does not feel distended. Has started passing flatus. No bowel movement yet.  Objective: Vital signs in last 24 hours: Temp:  [98 F (36.7 C)-98.6 F (37 C)] 98.6 F (37 C) (09/17 0554) Pulse Rate:  [77-91] 77 (09/17 0554) Resp:  [16-18] 18 (09/17 0554) BP: (116-139)/(67-80) 116/67 mmHg (09/17 0554) SpO2:  [88 %-97 %] 88 % (09/17 0554) Last BM Date: 02/14/13  Intake/Output from previous day: 09/16 0701 - 09/17 0700 In: 1440 [P.O.:1440] Out: 1401 [Urine:1400; Emesis/NG output:1] Intake/Output this shift:    General appearance: alert, cooperative and no distress GI: Soft. Bowel sounds appreciated. Incision healing well. No distention noted.  Lab Results:   Recent Labs  02/15/13 0548 02/16/13 0541  WBC 14.6* 13.1*  HGB 12.7 13.1  HCT 38.6 39.9  PLT 271 297   BMET  Recent Labs  02/15/13 0548 02/16/13 0541  NA 136 136  K 3.6 3.1*  CL 101 97  CO2 25 30  GLUCOSE 103* 122*  BUN 9 7  CREATININE 0.74 0.62  CALCIUM 8.9 9.0   PT/INR No results found for this basename: LABPROT, INR,  in the last 72 hours  Studies/Results: No results found.  Anti-infectives: Anti-infectives   Start     Dose/Rate Route Frequency Ordered Stop   02/12/13 2000  piperacillin-tazobactam (ZOSYN) IVPB 3.375 g     3.375 g 12.5 mL/hr over 240 Minutes Intravenous Every 8 hours 02/12/13 1917     02/12/13 1700  Ampicillin-Sulbactam (UNASYN) 3 g in sodium chloride 0.9 % 100 mL IVPB     3 g 100 mL/hr over 60 Minutes Intravenous  Once 02/12/13 1656 02/12/13 1846      Assessment/Plan: s/p Procedure(s): EXPLORATORY LAPAROTOMY Impression: Postoperative day 2, status post exploratory laparotomy. Bowel function is starting to return. She does have hypokalemia which will be treated. We'll continue full liquid diet for now. May be advanced once she has a bowel movement.  LOS: 4 days    [name] [name] A 02/16/2013

## 2013-02-17 LAB — BASIC METABOLIC PANEL
BUN: 5 mg/dL — ABNORMAL LOW (ref 6–23)
Chloride: 100 mEq/L (ref 96–112)
GFR calc Af Amer: >90 mL/min (ref >90)
GFR calc non Af Amer: 89 mL/min — ABNORMAL LOW (ref >90)
Potassium: 3.4 mEq/L — ABNORMAL LOW (ref 3.5–5.1)
Sodium: 137 mEq/L (ref 135–145)

## 2013-02-17 MED ORDER — LOSARTAN POTASSIUM 50 MG PO TABS
50.0000 mg | ORAL_TABLET | Freq: Every day | ORAL | Status: DC
  Administered 2013-02-17: 50 mg via ORAL
  Filled 2013-02-17: qty 1

## 2013-02-17 MED ORDER — POTASSIUM CHLORIDE CRYS ER 20 MEQ PO TBCR
40.0000 meq | EXTENDED_RELEASE_TABLET | Freq: Once | ORAL | Status: AC
  Administered 2013-02-17: 40 meq via ORAL
  Filled 2013-02-17: qty 2

## 2013-02-17 MED ORDER — AMLODIPINE BESYLATE 5 MG PO TABS
5.0000 mg | ORAL_TABLET | Freq: Every day | ORAL | Status: DC
  Administered 2013-02-17: 5 mg via ORAL
  Filled 2013-02-17: qty 1

## 2013-02-17 NOTE — Discharge Summary (Signed)
Physician Discharge Summary  Patient ID: [name] [name] MRN: [id number] DOB/AGE: [birth year] 67 y.o.  Admit date: 02/12/2013 Discharge date: 02/17/2013  Admission Diagnoses: Small bowel obstruction  Discharge Diagnoses: Same Principal Problem:   Partial small bowel obstruction Active Problems:   Fatty liver   Transaminitis   Leukocytosis, unspecified   Nausea with vomiting   Abdominal pain, unspecified site   Discharged Condition: good  Hospital Course: Patient is a 67 year old white female who presented any [facility] with worsening abdominal pain and nausea. CT scan of the abdomen revealed a partial small bowel obstruction. This was felt to be secondary to adhesive disease from previous abdominal surgeries. She went to the operating room on 02/14/2013 and underwent exploratory laparotomy. She did have a small internal hernia. No resection the bowel was needed. She tolerated the surgery well. Postoperative course has been remarkable only for hypokalemia. Her diet was advanced without difficulty once her bowel function returned. She is being discharged home in good improving condition.  Treatments: surgery: Exploratory laparotomy on 02/14/2013  Discharge Exam: Blood pressure 153/90, pulse 89, temperature 98.2 F (36.8 C), temperature source Oral, resp. rate 18, height 5\' 4"  (1.626 m), weight 90 kg (198 lb 6.6 oz), SpO2 95.00%. General appearance: alert, cooperative and no distress Resp: clear to auscultation bilaterally Cardio: regular rate and rhythm, S1, S2 normal, no murmur, click, rub or gallop GI: Soft. Incision healing well. Active bowel sounds appreciated.  Disposition: 01-Home or Self Care     Medication List         amLODipine 5 MG tablet  Commonly known as:  NORVASC  Take 5 mg by mouth daily.     aspirin EC 81 MG tablet  Take 81 mg by mouth daily.     atorvastatin 10 MG tablet  Commonly known as:  LIPITOR  Take 10 mg by mouth daily.     beta carotene  w/minerals tablet  Take 1 tablet by mouth daily.     CALCIUM 600 + D PO  Take 1 tablet by mouth 2 (two) times daily.     cholecalciferol 1000 UNITS tablet  Commonly known as:  VITAMIN D  Take 1,000 Units by mouth daily.     Fish Oil 1200 MG Caps  Take 1,200 mg by mouth 2 (two) times daily.     GLUCOS-CHONDROIT-MSM COMPLEX PO  Take 1 capsule by mouth 2 (two) times daily.     losartan 50 MG tablet  Commonly known as:  COZAAR  Take 50 mg by mouth daily.     multivitamin with minerals Tabs tablet  Take 1 tablet by mouth daily.     omeprazole 40 MG capsule  Commonly known as:  PRILOSEC  Take 40 mg by mouth daily.     Potassium 99 MG Tabs  Take 1 tablet by mouth daily.           Follow-up Information   Follow up with Dalia Heading, MD. Schedule an appointment as soon as possible for a visit on 02/24/2013.   Specialty:  General Surgery   Contact information:   [address] [place] [address] [phone]       Signed: Franky Macho A 02/17/2013, 1:24 PM

## 2013-02-17 NOTE — Progress Notes (Signed)
[facility] PROGRESS NOTE  [name] [name] ZOX:[id number] DOB: Oct 14, 1945 DOA: 02/12/2013 PCP: Colette Ribas, MD  Assessment/Plan: 1. Partial small bowel obstruction. 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 Appears doing well. Management per surgery.  2. Leukocytosis. Stable. Likely related to #1/acute illness 3. Transaminitis, likely related to fatty liver, stable 4. Hypertension. Stable. Resume antihypertensives. 5. Hypokalemia. Replete   Medically clear for discharge. Await surgical recommendations.  Pending studies:     Code Status: full code DVT prophylaxis: Lovenox Family Communication: none present Disposition Plan: home per surgery  Brendia Sacks, MD  [facility]  Pager [phone] If 7PM-7AM, please contact night-coverage at [email or web address], password [facility] 02/17/2013, 12:22 PM  LOS: 5 days   Summary: 67 year old woman presented to the emergency department with intractable vomiting and abdominal pain. CT scan revealed partial small bowel obstruction the patient was admitted for further evaluation. She was seen by general surgery and underwent exploratory laparotomy 9/15.  Consultants:  General surgery  Procedures:  9/15 exploratory laparotomy  Antibiotics:  Zosyn 9/13 >>   HPI/Subjective: No new issues. She is feeling well. Tolerating diet. Positive bowel movement.  Objective: Filed Vitals:   02/15/13 2105 02/16/13 0554 02/16/13 1356 02/17/13 0508  BP: 139/69 116/67 138/80 153/90  Pulse: 79 77 90 89  Temp: 98 F (36.7 C) 98.6 F (37 C) 98.2 F (36.8 C) 98.2 F (36.8 C)  TempSrc: Oral Oral  Oral  Resp: 16 18 18 18   Height:      Weight:      SpO2: 94% 88% 95% 95%    Intake/Output Summary (Last 24 hours) at 02/17/13 1222 Last data filed at 02/16/13 1816  Gross per 24 hour  Intake    820 ml  Output      0 ml  Net    820 ml     Filed Weights   02/12/13 1404 02/12/13 1843  Weight: 87.091 kg (192  lb) 90 kg (198 lb 6.6 oz)    Exam:   Afebrile, vital signs stable  General: Appears calm and comfortable sitting in chair.  Cardiovascular: Regular rate and rhythm. No murmur, rub, gallop.   Respiratory: Clear to auscultation bilaterally. No wheezes, rales, rhonchi. Normal respiratory effort.  Data Reviewed:  Potassium 3.4  Scheduled Meds: . aspirin EC  81 mg Oral Daily  . atorvastatin  10 mg Oral q1800  . enoxaparin (LOVENOX) injection  40 mg Subcutaneous Q24H  . magnesium hydroxide  30 mL Oral BID  . mupirocin ointment  1 application Nasal BID  . pantoprazole  40 mg Oral Daily  . piperacillin-tazobactam (ZOSYN)  IV  3.375 g Intravenous Q8H  . polyethylene glycol  17 g Oral Daily   Continuous Infusions:    Principal Problem:   Partial small bowel obstruction Active Problems:   Fatty liver   Transaminitis   Leukocytosis, unspecified   Nausea with vomiting   Abdominal pain, unspecified site   Time spent 15 minutes

## 2013-02-17 NOTE — Progress Notes (Signed)
Patient with orders to be discharge home. Discharge instructions given, patient verbalized understanding. Patient in stable condition upon discharge. Patient left with sister in private vehicle.

## 2013-06-08 DIAGNOSIS — H6980 Other specified disorders of Eustachian tube, unspecified ear: Secondary | ICD-10-CM | POA: Diagnosis not present

## 2013-06-08 DIAGNOSIS — J019 Acute sinusitis, unspecified: Secondary | ICD-10-CM | POA: Diagnosis not present

## 2013-06-08 DIAGNOSIS — Z6834 Body mass index (BMI) 34.0-34.9, adult: Secondary | ICD-10-CM | POA: Diagnosis not present

## 2013-07-08 ENCOUNTER — Inpatient Hospital Stay (HOSPITAL_COMMUNITY)
Admission: EM | Admit: 2013-07-08 | Discharge: 2013-07-11 | DRG: 390 | Disposition: A | Discharge status: Home / Self Care | Payer: Medicare Other | Attending: General Surgery | Admitting: General Surgery

## 2013-07-08 ENCOUNTER — Encounter (HOSPITAL_COMMUNITY): Payer: Self-pay | Admitting: Emergency Medicine

## 2013-07-08 ENCOUNTER — Emergency Department (HOSPITAL_COMMUNITY): Payer: Medicare Other

## 2013-07-08 DIAGNOSIS — R112 Nausea with vomiting, unspecified: Secondary | ICD-10-CM | POA: Diagnosis not present

## 2013-07-08 DIAGNOSIS — Z7982 Long term (current) use of aspirin: Secondary | ICD-10-CM | POA: Diagnosis not present

## 2013-07-08 DIAGNOSIS — K56609 Unspecified intestinal obstruction, unspecified as to partial versus complete obstruction: Secondary | ICD-10-CM | POA: Diagnosis not present

## 2013-07-08 DIAGNOSIS — D72829 Elevated white blood cell count, unspecified: Secondary | ICD-10-CM | POA: Diagnosis present

## 2013-07-08 DIAGNOSIS — R918 Other nonspecific abnormal finding of lung field: Secondary | ICD-10-CM | POA: Diagnosis not present

## 2013-07-08 DIAGNOSIS — K5669 Other intestinal obstruction: Secondary | ICD-10-CM | POA: Diagnosis not present

## 2013-07-08 DIAGNOSIS — R109 Unspecified abdominal pain: Secondary | ICD-10-CM | POA: Diagnosis not present

## 2013-07-08 DIAGNOSIS — R1084 Generalized abdominal pain: Secondary | ICD-10-CM | POA: Diagnosis not present

## 2013-07-08 LAB — CBC WITH DIFFERENTIAL/PLATELET
BASOS ABS: 0 10*3/uL (ref 0.0–0.1)
BASOS PCT: 0 % (ref 0–1)
Eosinophils Absolute: 0 10*3/uL (ref 0.0–0.7)
Eosinophils Relative: 0 % (ref 0–5)
HCT: 46.1 % — ABNORMAL HIGH (ref 36.0–46.0)
HEMOGLOBIN: 15.5 g/dL — AB (ref 12.0–15.0)
Lymphocytes Relative: 5 % — ABNORMAL LOW (ref 12–46)
Lymphs Abs: 1 10*3/uL (ref 0.7–4.0)
MCH: 31 pg (ref 26.0–34.0)
MCHC: 33.6 g/dL (ref 30.0–36.0)
MCV: 92.2 fL (ref 78.0–100.0)
MONOS PCT: 9 % (ref 3–12)
Monocytes Absolute: 2 10*3/uL — ABNORMAL HIGH (ref 0.1–1.0)
NEUTROS ABS: 18.6 10*3/uL — AB (ref 1.7–7.7)
NEUTROS PCT: 86 % — AB (ref 43–77)
PLATELETS: 324 10*3/uL (ref 150–400)
RBC: 5 MIL/uL (ref 3.87–5.11)
RDW: 13.4 % (ref 11.5–15.5)
WBC: 21.6 10*3/uL — ABNORMAL HIGH (ref 4.0–10.5)

## 2013-07-08 LAB — COMPREHENSIVE METABOLIC PANEL
ALT: 33 U/L (ref 0–35)
AST: 27 U/L (ref 0–37)
Albumin: 4 g/dL (ref 3.5–5.2)
Alkaline Phosphatase: 93 U/L (ref 39–117)
BILIRUBIN TOTAL: 0.9 mg/dL (ref 0.3–1.2)
BUN: 15 mg/dL (ref 6–23)
CALCIUM: 9.4 mg/dL (ref 8.4–10.5)
CHLORIDE: 98 meq/L (ref 96–112)
CO2: 23 meq/L (ref 19–32)
CREATININE: 0.58 mg/dL (ref 0.50–1.10)
GLUCOSE: 164 mg/dL — AB (ref 70–99)
Potassium: 3.8 mEq/L (ref 3.7–5.3)
Sodium: 139 mEq/L (ref 137–147)
Total Protein: 8 g/dL (ref 6.0–8.3)

## 2013-07-08 LAB — URINALYSIS, ROUTINE W REFLEX MICROSCOPIC
Bilirubin Urine: NEGATIVE
Glucose, UA: NEGATIVE mg/dL
HGB URINE DIPSTICK: NEGATIVE
Ketones, ur: 15 mg/dL — AB
LEUKOCYTES UA: NEGATIVE
NITRITE: NEGATIVE
PROTEIN: 30 mg/dL — AB
UROBILINOGEN UA: 0.2 mg/dL (ref 0.0–1.0)
pH: 6 (ref 5.0–8.0)

## 2013-07-08 LAB — URINE MICROSCOPIC-ADD ON

## 2013-07-08 LAB — LIPASE, BLOOD: Lipase: 22 U/L (ref 11–59)

## 2013-07-08 MED ORDER — ONDANSETRON HCL 4 MG/2ML IJ SOLN
4.0000 mg | Freq: Once | INTRAMUSCULAR | Status: AC
  Administered 2013-07-08: 4 mg via INTRAVENOUS
  Filled 2013-07-08: qty 2

## 2013-07-08 MED ORDER — SODIUM CHLORIDE 0.9 % IV BOLUS (SEPSIS)
1000.0000 mL | Freq: Once | INTRAVENOUS | Status: AC
  Administered 2013-07-08: 1000 mL via INTRAVENOUS

## 2013-07-08 MED ORDER — HYDROMORPHONE HCL PF 1 MG/ML IJ SOLN
1.0000 mg | Freq: Once | INTRAMUSCULAR | Status: AC
  Administered 2013-07-08: 1 mg via INTRAVENOUS
  Filled 2013-07-08: qty 1

## 2013-07-08 MED ORDER — ONDANSETRON HCL 4 MG/2ML IJ SOLN
4.0000 mg | Freq: Once | INTRAMUSCULAR | Status: AC
Start: 2013-07-08 — End: 2013-07-08
  Administered 2013-07-08: 4 mg via INTRAVENOUS
  Filled 2013-07-08: qty 2

## 2013-07-08 MED ORDER — IOHEXOL 300 MG/ML  SOLN
50.0000 mL | Freq: Once | INTRAMUSCULAR | Status: AC | PRN
  Administered 2013-07-08: 50 mL via ORAL

## 2013-07-08 MED ORDER — SODIUM CHLORIDE 0.9 % IV SOLN
INTRAVENOUS | Status: DC
  Administered 2013-07-09: via INTRAVENOUS

## 2013-07-08 MED ORDER — IOHEXOL 300 MG/ML  SOLN
100.0000 mL | Freq: Once | INTRAMUSCULAR | Status: AC | PRN
  Administered 2013-07-08: 100 mL via INTRAVENOUS

## 2013-07-08 NOTE — ED Notes (Signed)
CT tech notified that patient has completed oral contrast.

## 2013-07-08 NOTE — ED Notes (Signed)
Patient requesting not to have NG tube placed at this time.  Dr. Rogene Houston aware.

## 2013-07-08 NOTE — ED Notes (Signed)
Patient refusing additional pain medication at this time.

## 2013-07-08 NOTE — ED Notes (Signed)
Patient reports midline abdominal pain and emesis that started today.

## 2013-07-08 NOTE — ED Provider Notes (Addendum)
CSN: [id number]     Arrival date & time 07/08/13  1959 History  This chart was scribed for [name] Kung, MD by Ludger Nutting, ED Scribe. This patient was seen in room [facility]04/[facility]04 and the patient's care was started 8:39 PM.    Chief Complaint  Patient presents with  . Abdominal Pain  . Emesis    The history is provided by the patient. No language interpreter was used.    HPI Comments: [name] [name] is a 68 y.o. female who presents to the Emergency Department complaining of constant, gradually worsening midline abdominal pain that began 19 hours ago. She reports having associated episodes of vomiting that began about 16 hours ago. She reports having 40-50 episodes of vomiting today. She reports having a small bowel obstruction which required surgery by Dr. Arnoldo Morale in September 2014. She denies diarrhea, hematemesis. She has history of appendectomy and abdominal hysterectomy.   PCP Dr. Hilma Favors   Past Medical History  Diagnosis Date  . H/O hiatal hernia    Past Surgical History  Procedure Laterality Date  . Appendectomy    . Abdominal hysterectomy    . Colonoscopy  03/18/2012    Procedure: COLONOSCOPY;  Surgeon: Rogene Houston, MD;  Location: [facility];  Service: Endoscopy;  Laterality: N/A;  930  . Laparotomy N/A 02/14/2013    Procedure: EXPLORATORY LAPAROTOMY;  Surgeon: Jamesetta So, MD;  Location: [facility];  Service: General;  Laterality: N/A;   History reviewed. No pertinent family history. History  Substance Use Topics  . Smoking status: Never Smoker   . Smokeless tobacco: Not on file  . Alcohol Use: No   OB History   Grav Para Term Preterm Abortions TAB SAB Ect Mult Living                 Review of Systems  Constitutional: Negative for fever and chills.  HENT: Negative for rhinorrhea and sore throat.   Eyes: Negative for visual disturbance.  Respiratory: Negative for cough and shortness of breath.   Cardiovascular: Negative for chest pain and leg swelling.   Gastrointestinal: Positive for nausea, vomiting and abdominal pain (midline). Negative for diarrhea.  Genitourinary: Negative for dysuria.  Musculoskeletal: Negative for back pain and neck pain.  Skin: Negative for rash.  Neurological: Negative for headaches.  Hematological: Does not bruise/bleed easily.  Psychiatric/Behavioral: Negative for confusion.    Allergies  Review of patient's allergies indicates no known allergies.  Home Medications   Current Outpatient Rx  Name  Route  Sig  Dispense  Refill  . amLODipine (NORVASC) 5 MG tablet   Oral   Take 5 mg by mouth daily.         Marland Kitchen aspirin EC 81 MG tablet   Oral   Take 81 mg by mouth daily.         Marland Kitchen atorvastatin (LIPITOR) 10 MG tablet   Oral   Take 10 mg by mouth daily.         . beta carotene w/minerals (OCUVITE) tablet   Oral   Take 1 tablet by mouth daily.         . Calcium Carbonate-Vitamin D (CALCIUM 600 + D PO)   Oral   Take 1 tablet by mouth 2 (two) times daily.         . cholecalciferol (VITAMIN D) 1000 UNITS tablet   Oral   Take 1,000 Units by mouth daily.         Marland Kitchen ibuprofen (ADVIL,MOTRIN)  800 MG tablet   Oral   Take 800 mg by mouth every 8 (eight) hours as needed. pain         . losartan (COZAAR) 50 MG tablet   Oral   Take 50 mg by mouth daily.         . Misc Natural Products (GLUCOS-CHONDROIT-MSM COMPLEX PO)   Oral   Take 1 capsule by mouth 2 (two) times daily.         . Multiple Vitamin (MULTIVITAMIN WITH MINERALS) TABS   Oral   Take 1 tablet by mouth daily.         . Omega-3 Fatty Acids (FISH OIL) 1200 MG CAPS   Oral   Take 1,200 mg by mouth 2 (two) times daily.         Marland Kitchen omeprazole (PRILOSEC) 40 MG capsule   Oral   Take 40 mg by mouth daily.         . Potassium 99 MG TABS   Oral   Take 1 tablet by mouth daily.          BP 115/80  Pulse 102  Temp(Src) 98 F (36.7 C) (Oral)  Resp 18  Ht 5\' 4"  (1.626 m)  Wt 188 lb (85.276 kg)  BMI 32.25 kg/m2  SpO2  94% Physical Exam  Nursing note and vitals reviewed. Constitutional: She is oriented to person, place, and time. She appears well-developed and well-nourished.  HENT:  Head: Normocephalic and atraumatic.  Mouth/Throat: Mucous membranes are dry.  Cardiovascular: Normal rate, regular rhythm and normal heart sounds.   Pulmonary/Chest: Effort normal and breath sounds normal. No respiratory distress. She has no wheezes. She has no rales. She exhibits no tenderness.  Abdominal: Soft. She exhibits no distension. Bowel sounds are decreased. There is tenderness (diffuse). There is no guarding.  Musculoskeletal: Normal range of motion. She exhibits no edema.  Neurological: She is alert and oriented to person, place, and time.  Skin: Skin is warm and dry.  Psychiatric: She has a normal mood and affect.    ED Course  Procedures (including critical care time)  DIAGNOSTIC STUDIES: Oxygen Saturation is 94% on RA, adequate by my interpretation.    COORDINATION OF CARE: 9:09 PM Will order CT scan. Discussed treatment plan with pt at bedside and pt agreed to plan.   Labs Review Labs Reviewed  COMPREHENSIVE METABOLIC PANEL - Abnormal; Notable for the following:    Glucose, Bld 164 (*)    All other components within normal limits  CBC WITH DIFFERENTIAL - Abnormal; Notable for the following:    WBC 21.6 (*)    Hemoglobin 15.5 (*)    HCT 46.1 (*)    Neutrophils Relative % 86 (*)    Neutro Abs 18.6 (*)    Lymphocytes Relative 5 (*)    Monocytes Absolute 2.0 (*)    All other components within normal limits  URINALYSIS, ROUTINE W REFLEX MICROSCOPIC - Abnormal; Notable for the following:    Specific Gravity, Urine >1.030 (*)    Ketones, ur 15 (*)    Protein, ur 30 (*)    All other components within normal limits  URINE MICROSCOPIC-ADD ON - Abnormal; Notable for the following:    Bacteria, UA FEW (*)    All other components within normal limits  LIPASE, BLOOD   Results for orders placed during  the hospital encounter of 07/08/13  LIPASE, BLOOD      Result Value Range   Lipase 22  11 - 59  U/L  COMPREHENSIVE METABOLIC PANEL      Result Value Range   Sodium 139  137 - 147 mEq/L   Potassium 3.8  3.7 - 5.3 mEq/L   Chloride 98  96 - 112 mEq/L   CO2 23  19 - 32 mEq/L   Glucose, Bld 164 (*) 70 - 99 mg/dL   BUN 15  6 - 23 mg/dL   Creatinine, Ser 0.58  0.50 - 1.10 mg/dL   Calcium 9.4  8.4 - 10.5 mg/dL   Total Protein 8.0  6.0 - 8.3 g/dL   Albumin 4.0  3.5 - 5.2 g/dL   AST 27  0 - 37 U/L   ALT 33  0 - 35 U/L   Alkaline Phosphatase 93  39 - 117 U/L   Total Bilirubin 0.9  0.3 - 1.2 mg/dL   GFR calc non Af Amer >90  >90 mL/min   GFR calc Af Amer >90  >90 mL/min  CBC WITH DIFFERENTIAL      Result Value Range   WBC 21.6 (*) 4.0 - 10.5 K/uL   RBC 5.00  3.87 - 5.11 MIL/uL   Hemoglobin 15.5 (*) 12.0 - 15.0 g/dL   HCT 46.1 (*) 36.0 - 46.0 %   MCV 92.2  78.0 - 100.0 fL   MCH 31.0  26.0 - 34.0 pg   MCHC 33.6  30.0 - 36.0 g/dL   RDW 13.4  11.5 - 15.5 %   Platelets 324  150 - 400 K/uL   Neutrophils Relative % 86 (*) 43 - 77 %   Neutro Abs 18.6 (*) 1.7 - 7.7 K/uL   Lymphocytes Relative 5 (*) 12 - 46 %   Lymphs Abs 1.0  0.7 - 4.0 K/uL   Monocytes Relative 9  3 - 12 %   Monocytes Absolute 2.0 (*) 0.1 - 1.0 K/uL   Eosinophils Relative 0  0 - 5 %   Eosinophils Absolute 0.0  0.0 - 0.7 K/uL   Basophils Relative 0  0 - 1 %   Basophils Absolute 0.0  0.0 - 0.1 K/uL  URINALYSIS, ROUTINE W REFLEX MICROSCOPIC      Result Value Range   Color, Urine YELLOW  YELLOW   APPearance CLEAR  CLEAR   Specific Gravity, Urine >1.030 (*) 1.005 - 1.030   pH 6.0  5.0 - 8.0   Glucose, UA NEGATIVE  NEGATIVE mg/dL   Hgb urine dipstick NEGATIVE  NEGATIVE   Bilirubin Urine NEGATIVE  NEGATIVE   Ketones, ur 15 (*) NEGATIVE mg/dL   Protein, ur 30 (*) NEGATIVE mg/dL   Urobilinogen, UA 0.2  0.0 - 1.0 mg/dL   Nitrite NEGATIVE  NEGATIVE   Leukocytes, UA NEGATIVE  NEGATIVE  URINE MICROSCOPIC-ADD ON      Result  Value Range   Squamous Epithelial / LPF RARE  RARE   WBC, UA 0-2  <3 WBC/hpf   Bacteria, UA FEW (*) RARE   Urine-Other MUCOUS PRESENT      Imaging Review Dg Chest 2 View  07/08/2013   CLINICAL DATA:  Epigastric abdominal pain and distention.  EXAM: CHEST  2 VIEW  COMPARISON:  02/12/2013.  FINDINGS: The heart remains normal in size and the lungs are clear with normal vascularity. Minimal scoliosis. Minimal thoracic spine degenerative changes.  IMPRESSION: No acute abnormality.   Electronically Signed   By: Enrique Sack M.D.   On: 07/08/2013 21:53   Ct Abdomen Pelvis W Contrast  07/08/2013   CLINICAL DATA:  68 year old  female with worsening midline abdominal pain, vomiting. History of small bowel obstruction requiring surgery. Initial encounter. Appendectomy and hysterectomy.  EXAM: CT ABDOMEN AND PELVIS WITH CONTRAST  TECHNIQUE: Multidetector CT imaging of the abdomen and pelvis was performed using the standard protocol following bolus administration of intravenous contrast.  CONTRAST:  13mL OMNIPAQUE IOHEXOL 300 MG/ML SOLN, 186mL OMNIPAQUE IOHEXOL 300 MG/ML SOLN  COMPARISON:  CT Abdomen and Pelvis 02/13/2003.  FINDINGS: No pericardial or pleural effusion. Stable lung bases with mild atelectasis or scarring.  No acute osseous abnormality identified.  Trace pelvic free fluid. Bladder is decompressed. Distal colon is decompressed. The sigmoid is redundant. Left colon is decompressed. Transverse colon is decompressed. Occasional diverticula. Right colon is largely decompressed. There is some contrast material in the cecum which appears to be unrelated to the current exam. The terminal ileum is decompressed.  In the right lower quadrant there is an abrupt transition point from mildly dilated distal small bowel with loculated contents (diameter 31 mm) to decompressed bowel (series 2, image 68, coronal image 34). The upstream small bowel loops are diffusely dilated and fluid-filled. Maximum small bowel diameter 34  mm. No abdominal free fluid.  Large volume of oral contrast distending the stomach and has only reached the duodenum bulb. There is some retained contrast in nondilated distal esophagus. The duodenum and ligament of Treitz are decompressed.  Mildly decreased density throughout the liver. Gallbladder, spleen, pancreas (fatty infiltrated) and adrenal glands are within normal limits. Portal venous system within normal limits. Haziness at the root of the small bowel mesentery. Small mesenteric nodes are stable. No free air. Stable and negative kidneys and ureters. Mild Aortoiliac calcified atherosclerosis noted. Major arterial structures in the abdomen and pelvis are patent. No lymphadenopathy.  IMPRESSION: 1. Moderate grade small bowel obstruction with focal transition point in the right lower quadrant in the distal small bowel. Similar appearance to the September 2014 study. 2. Small volume pelvic free fluid, likely reactive.   Electronically Signed   By: Lars Pinks M.D.   On: 07/08/2013 22:46    EKG Interpretation    Date/Time:  Friday July 08 2013 21:31:08 EST Ventricular Rate:  116 PR Interval:  150 QRS Duration: 86 QT Interval:  358 QTC Calculation: 497 R Axis:   16 Text Interpretation:  Sinus tachycardia Possible Left atrial enlargement Nonspecific ST abnormality Abnormal ECG When compared with ECG of 13-Feb-2013 11:35, Premature supraventricular complexes are no longer Present Vent. rate has increased BY  48 BPM Nonspecific T wave abnormality no longer evident in Anterior leads Confirmed by Alli Jasmer  MD, Leonna Schlee ([id number]) on 07/08/2013 9:42:11 PM            MDM   1. Small bowel obstruction    CT scan consistent with small bowel obstruction. Discussed with general surgery. Patient's abdomen mildly tender no guarding. Marked leukocytosis. Multiple episodes of vomiting. Patient status post laparotomy for small bowel obstruction back in September. That was also done by Dr. Arnoldo Morale  Dr.  Arnoldo Morale will admit for small bowel obstruction. He will do the admitting orders.  I personally performed the services described in this documentation, which was scribed in my presence. The recorded information has been reviewed and is accurate.    [name] Kung, MD 07/08/13 [id number]     [name] Kung, MD 07/08/13 [phone]

## 2013-07-09 ENCOUNTER — Encounter (HOSPITAL_COMMUNITY): Payer: Self-pay | Admitting: *Deleted

## 2013-07-09 LAB — CBC
HCT: 42.9 % (ref 36.0–46.0)
HEMOGLOBIN: 14.1 g/dL (ref 12.0–15.0)
MCH: 31 pg (ref 26.0–34.0)
MCHC: 32.9 g/dL (ref 30.0–36.0)
MCV: 94.3 fL (ref 78.0–100.0)
PLATELETS: 309 10*3/uL (ref 150–400)
RBC: 4.55 MIL/uL (ref 3.87–5.11)
RDW: 13.8 % (ref 11.5–15.5)
WBC: 18.2 10*3/uL — AB (ref 4.0–10.5)

## 2013-07-09 LAB — COMPREHENSIVE METABOLIC PANEL
ALT: 26 U/L (ref 0–35)
AST: 23 U/L (ref 0–37)
Albumin: 3.3 g/dL — ABNORMAL LOW (ref 3.5–5.2)
Alkaline Phosphatase: 75 U/L (ref 39–117)
BUN: 18 mg/dL (ref 6–23)
CO2: 25 meq/L (ref 19–32)
Calcium: 8.8 mg/dL (ref 8.4–10.5)
Chloride: 100 mEq/L (ref 96–112)
Creatinine, Ser: 0.68 mg/dL (ref 0.50–1.10)
GFR calc Af Amer: >90 mL/min (ref >90)
GFR, EST NON AFRICAN AMERICAN: 89 mL/min — AB (ref >90)
GLUCOSE: 149 mg/dL — AB (ref 70–99)
Potassium: 4.1 mEq/L (ref 3.7–5.3)
SODIUM: 137 meq/L (ref 137–147)
Total Bilirubin: 0.8 mg/dL (ref 0.3–1.2)
Total Protein: 6.9 g/dL (ref 6.0–8.3)

## 2013-07-09 LAB — MAGNESIUM: Magnesium: 2 mg/dL (ref 1.5–2.5)

## 2013-07-09 LAB — PHOSPHORUS: Phosphorus: 3.5 mg/dL (ref 2.3–4.6)

## 2013-07-09 MED ORDER — PANTOPRAZOLE SODIUM 40 MG IV SOLR
40.0000 mg | Freq: Every day | INTRAVENOUS | Status: DC
  Administered 2013-07-09 – 2013-07-10 (×3): 40 mg via INTRAVENOUS
  Filled 2013-07-09 (×3): qty 40

## 2013-07-09 MED ORDER — ENOXAPARIN SODIUM 40 MG/0.4ML ~~LOC~~ SOLN
40.0000 mg | SUBCUTANEOUS | Status: DC
  Administered 2013-07-09 – 2013-07-11 (×3): 40 mg via SUBCUTANEOUS
  Filled 2013-07-09 (×2): qty 0.4

## 2013-07-09 MED ORDER — ONDANSETRON HCL 4 MG/2ML IJ SOLN: 4.0000 mg | Freq: Four times a day (QID) | INTRAMUSCULAR | Status: DC | PRN

## 2013-07-09 MED ORDER — KCL IN DEXTROSE-NACL 20-5-0.45 MEQ/L-%-% IV SOLN
INTRAVENOUS | Status: DC
  Administered 2013-07-09 – 2013-07-10 (×5): via INTRAVENOUS

## 2013-07-09 MED ORDER — KETOROLAC TROMETHAMINE 15 MG/ML IJ SOLN
30.0000 mg | Freq: Four times a day (QID) | INTRAMUSCULAR | Status: DC | PRN
  Administered 2013-07-09 – 2013-07-10 (×3): 30 mg via INTRAVENOUS
  Filled 2013-07-09 (×3): qty 2

## 2013-07-09 MED ORDER — ENALAPRILAT 1.25 MG/ML IV SOLN
1.2500 mg | Freq: Four times a day (QID) | INTRAVENOUS | Status: DC
  Administered 2013-07-09 – 2013-07-10 (×2): 1.25 mg via INTRAVENOUS
  Filled 2013-07-09 (×2): qty 2

## 2013-07-09 MED ORDER — MORPHINE SULFATE 2 MG/ML IJ SOLN: 2.0000 mg | INTRAMUSCULAR | Status: DC | PRN

## 2013-07-09 MED ORDER — MENTHOL 3 MG MT LOZG
1.0000 | LOZENGE | OROMUCOSAL | Status: DC | PRN
  Administered 2013-07-09 (×2): 3 mg via ORAL
  Filled 2013-07-09 (×2): qty 9

## 2013-07-09 NOTE — Plan of Care (Signed)
Dr Arnoldo Morale paged about BP 184/97.  Ordered Enalapril 1.25mg  q6hr. Order placed.

## 2013-07-09 NOTE — Progress Notes (Signed)
Utilization review Completed Oskar Cretella RN BSN   

## 2013-07-09 NOTE — H&P (Signed)
[name] [name] is an 68 y.o. female.   Chief Complaint: Nausea and vomiting HPI: Patient is a 68 year old white female status post exploratory laparotomy with lysis of adhesions in September 2014 who presented to the hospital with a less than 24-hour history of worsening nausea and vomiting. She states she had a normal bowel movement yesterday. She denies any constipation. She started having some nonspecific abdominal pain and significant nausea and vomiting. CT scan the abdomen revealed partial small bowel obstruction in the right lower portion of the abdomen. Patient had an NG tube placed and states that her abdominal pain has resolved. She has not had flatus or bowel movement since being admitted.  Past Medical History  Diagnosis Date  . H/O hiatal hernia     Past Surgical History  Procedure Laterality Date  . Appendectomy    . Abdominal hysterectomy    . Colonoscopy  03/18/2012    Procedure: COLONOSCOPY;  Surgeon: Rogene Houston, MD;  Location: [facility];  Service: Endoscopy;  Laterality: N/A;  930  . Laparotomy N/A 02/14/2013    Procedure: EXPLORATORY LAPAROTOMY;  Surgeon: Jamesetta So, MD;  Location: [facility];  Service: General;  Laterality: N/A;    History reviewed. No pertinent family history. Social History:  reports that she has never smoked. She does not have any smokeless tobacco history on file. She reports that she does not drink alcohol or use illicit drugs.  Allergies: No Known Allergies  Medications Prior to Admission  Medication Sig Dispense Refill  . amLODipine (NORVASC) 5 MG tablet Take 5 mg by mouth daily.      Marland Kitchen aspirin EC 81 MG tablet Take 81 mg by mouth daily.      Marland Kitchen atorvastatin (LIPITOR) 10 MG tablet Take 10 mg by mouth daily.      . beta carotene w/minerals (OCUVITE) tablet Take 1 tablet by mouth daily.      . Calcium Carbonate-Vitamin D (CALCIUM 600 + D PO) Take 1 tablet by mouth 2 (two) times daily.      . cholecalciferol (VITAMIN D) 1000 UNITS  tablet Take 1,000 Units by mouth daily.      Marland Kitchen ibuprofen (ADVIL,MOTRIN) 800 MG tablet Take 800 mg by mouth every 8 (eight) hours as needed. pain      . losartan (COZAAR) 50 MG tablet Take 50 mg by mouth daily.      . Misc Natural Products (GLUCOS-CHONDROIT-MSM COMPLEX PO) Take 1 capsule by mouth 2 (two) times daily.      . Multiple Vitamin (MULTIVITAMIN WITH MINERALS) TABS Take 1 tablet by mouth daily.      . Omega-3 Fatty Acids (FISH OIL) 1200 MG CAPS Take 1,200 mg by mouth 2 (two) times daily.      Marland Kitchen omeprazole (PRILOSEC) 40 MG capsule Take 40 mg by mouth daily.      . Potassium 99 MG TABS Take 1 tablet by mouth daily.        Results for orders placed during the hospital encounter of 07/08/13 (from the past 48 hour(s))  LIPASE, BLOOD     Status: None   Collection Time    07/08/13  9:25 PM      Result Value Range   Lipase 22  11 - 59 U/L  COMPREHENSIVE METABOLIC PANEL     Status: Abnormal   Collection Time    07/08/13  9:25 PM      Result Value Range   Sodium 139  137 - 147 mEq/L  Potassium 3.8  3.7 - 5.3 mEq/L   Chloride 98  96 - 112 mEq/L   CO2 23  19 - 32 mEq/L   Glucose, Bld 164 (*) 70 - 99 mg/dL   BUN 15  6 - 23 mg/dL   Creatinine, Ser 0.58  0.50 - 1.10 mg/dL   Calcium 9.4  8.4 - 10.5 mg/dL   Total Protein 8.0  6.0 - 8.3 g/dL   Albumin 4.0  3.5 - 5.2 g/dL   AST 27  0 - 37 U/L   ALT 33  0 - 35 U/L   Alkaline Phosphatase 93  39 - 117 U/L   Total Bilirubin 0.9  0.3 - 1.2 mg/dL   GFR calc non Af Amer >90  >90 mL/min   GFR calc Af Amer >90  >90 mL/min   Comment: (NOTE)     The eGFR has been calculated using the CKD EPI equation.     This calculation has not been validated in all clinical situations.     eGFR's persistently <90 mL/min signify possible Chronic Kidney     Disease.  CBC WITH DIFFERENTIAL     Status: Abnormal   Collection Time    07/08/13  9:25 PM      Result Value Range   WBC 21.6 (*) 4.0 - 10.5 K/uL   RBC 5.00  3.87 - 5.11 MIL/uL   Hemoglobin 15.5 (*)  12.0 - 15.0 g/dL   HCT 46.1 (*) 36.0 - 46.0 %   MCV 92.2  78.0 - 100.0 fL   MCH 31.0  26.0 - 34.0 pg   MCHC 33.6  30.0 - 36.0 g/dL   RDW 13.4  11.5 - 15.5 %   Platelets 324  150 - 400 K/uL   Neutrophils Relative % 86 (*) 43 - 77 %   Neutro Abs 18.6 (*) 1.7 - 7.7 K/uL   Lymphocytes Relative 5 (*) 12 - 46 %   Lymphs Abs 1.0  0.7 - 4.0 K/uL   Monocytes Relative 9  3 - 12 %   Monocytes Absolute 2.0 (*) 0.1 - 1.0 K/uL   Eosinophils Relative 0  0 - 5 %   Eosinophils Absolute 0.0  0.0 - 0.7 K/uL   Basophils Relative 0  0 - 1 %   Basophils Absolute 0.0  0.0 - 0.1 K/uL  URINALYSIS, ROUTINE W REFLEX MICROSCOPIC     Status: Abnormal   Collection Time    07/08/13 10:03 PM      Result Value Range   Color, Urine YELLOW  YELLOW   APPearance CLEAR  CLEAR   Specific Gravity, Urine >1.030 (*) 1.005 - 1.030   pH 6.0  5.0 - 8.0   Glucose, UA NEGATIVE  NEGATIVE mg/dL   Hgb urine dipstick NEGATIVE  NEGATIVE   Bilirubin Urine NEGATIVE  NEGATIVE   Ketones, ur 15 (*) NEGATIVE mg/dL   Protein, ur 30 (*) NEGATIVE mg/dL   Urobilinogen, UA 0.2  0.0 - 1.0 mg/dL   Nitrite NEGATIVE  NEGATIVE   Leukocytes, UA NEGATIVE  NEGATIVE  URINE MICROSCOPIC-ADD ON     Status: Abnormal   Collection Time    07/08/13 10:03 PM      Result Value Range   Squamous Epithelial / LPF RARE  RARE   WBC, UA 0-2  <3 WBC/hpf   Bacteria, UA FEW (*) RARE   Urine-Other MUCOUS PRESENT    MAGNESIUM     Status: None   Collection Time    07/09/13  7:11 AM      Result Value Range   Magnesium 2.0  1.5 - 2.5 mg/dL  PHOSPHORUS     Status: None   Collection Time    07/09/13  7:11 AM      Result Value Range   Phosphorus 3.5  2.3 - 4.6 mg/dL  CBC     Status: Abnormal   Collection Time    07/09/13  7:11 AM      Result Value Range   WBC 18.2 (*) 4.0 - 10.5 K/uL   RBC 4.55  3.87 - 5.11 MIL/uL   Hemoglobin 14.1  12.0 - 15.0 g/dL   HCT 42.9  36.0 - 46.0 %   MCV 94.3  78.0 - 100.0 fL   MCH 31.0  26.0 - 34.0 pg   MCHC 32.9  30.0 -  36.0 g/dL   RDW 13.8  11.5 - 15.5 %   Platelets 309  150 - 400 K/uL  COMPREHENSIVE METABOLIC PANEL     Status: Abnormal   Collection Time    07/09/13  7:11 AM      Result Value Range   Sodium 137  137 - 147 mEq/L   Potassium 4.1  3.7 - 5.3 mEq/L   Chloride 100  96 - 112 mEq/L   CO2 25  19 - 32 mEq/L   Glucose, Bld 149 (*) 70 - 99 mg/dL   BUN 18  6 - 23 mg/dL   Creatinine, Ser 0.68  0.50 - 1.10 mg/dL   Calcium 8.8  8.4 - 10.5 mg/dL   Total Protein 6.9  6.0 - 8.3 g/dL   Albumin 3.3 (*) 3.5 - 5.2 g/dL   AST 23  0 - 37 U/L   ALT 26  0 - 35 U/L   Alkaline Phosphatase 75  39 - 117 U/L   Total Bilirubin 0.8  0.3 - 1.2 mg/dL   GFR calc non Af Amer 89 (*) >90 mL/min   GFR calc Af Amer >90  >90 mL/min   Comment: (NOTE)     The eGFR has been calculated using the CKD EPI equation.     This calculation has not been validated in all clinical situations.     eGFR's persistently <90 mL/min signify possible Chronic Kidney     Disease.   Dg Chest 2 View  07/08/2013   CLINICAL DATA:  Epigastric abdominal pain and distention.  EXAM: CHEST  2 VIEW  COMPARISON:  02/12/2013.  FINDINGS: The heart remains normal in size and the lungs are clear with normal vascularity. Minimal scoliosis. Minimal thoracic spine degenerative changes.  IMPRESSION: No acute abnormality.   Electronically Signed   By: Enrique Sack M.D.   On: 07/08/2013 21:53   Ct Abdomen Pelvis W Contrast  07/08/2013   CLINICAL DATA:  68 year old female with worsening midline abdominal pain, vomiting. History of small bowel obstruction requiring surgery. Initial encounter. Appendectomy and hysterectomy.  EXAM: CT ABDOMEN AND PELVIS WITH CONTRAST  TECHNIQUE: Multidetector CT imaging of the abdomen and pelvis was performed using the standard protocol following bolus administration of intravenous contrast.  CONTRAST:  32m OMNIPAQUE IOHEXOL 300 MG/ML SOLN, 1061mOMNIPAQUE IOHEXOL 300 MG/ML SOLN  COMPARISON:  CT Abdomen and Pelvis 02/13/2003.  FINDINGS:  No pericardial or pleural effusion. Stable lung bases with mild atelectasis or scarring.  No acute osseous abnormality identified.  Trace pelvic free fluid. Bladder is decompressed. Distal colon is decompressed. The sigmoid is redundant. Left colon is decompressed. Transverse colon is decompressed. Occasional  diverticula. Right colon is largely decompressed. There is some contrast material in the cecum which appears to be unrelated to the current exam. The terminal ileum is decompressed.  In the right lower quadrant there is an abrupt transition point from mildly dilated distal small bowel with loculated contents (diameter 31 mm) to decompressed bowel (series 2, image 68, coronal image 34). The upstream small bowel loops are diffusely dilated and fluid-filled. Maximum small bowel diameter 34 mm. No abdominal free fluid.  Large volume of oral contrast distending the stomach and has only reached the duodenum bulb. There is some retained contrast in nondilated distal esophagus. The duodenum and ligament of Treitz are decompressed.  Mildly decreased density throughout the liver. Gallbladder, spleen, pancreas (fatty infiltrated) and adrenal glands are within normal limits. Portal venous system within normal limits. Haziness at the root of the small bowel mesentery. Small mesenteric nodes are stable. No free air. Stable and negative kidneys and ureters. Mild Aortoiliac calcified atherosclerosis noted. Major arterial structures in the abdomen and pelvis are patent. No lymphadenopathy.  IMPRESSION: 1. Moderate grade small bowel obstruction with focal transition point in the right lower quadrant in the distal small bowel. Similar appearance to the September 2014 study. 2. Small volume pelvic free fluid, likely reactive.   Electronically Signed   By: Lars Pinks M.D.   On: 07/08/2013 22:46    Review of Systems  Constitutional: Positive for malaise/fatigue.  Eyes: Negative.   Respiratory: Negative.   Cardiovascular:  Negative.   Gastrointestinal: Positive for nausea, vomiting and abdominal pain.  Genitourinary: Negative.   Musculoskeletal: Negative.   Skin: Negative.   Neurological: Positive for headaches.  All other systems reviewed and are negative.    Blood pressure 138/83, pulse 80, temperature 97.8 F (36.6 C), temperature source Oral, resp. rate 18, height _0  (1.626 m), weight 87.5 kg (192 lb 14.4 oz), SpO2 94.00%. Physical Exam  Constitutional: She is oriented to person, place, and time. She appears well-developed.  HENT:  Head: Normocephalic and atraumatic.  Neck: Normal range of motion. Neck supple.  Cardiovascular: Normal rate, regular rhythm and normal heart sounds.   Respiratory: Effort normal and breath sounds normal.  GI: Soft. There is no tenderness. There is no rebound.  Minimal bowel sounds appreciated. Slightly distended on exam, but exam limited secondary to body habitus.  Neurological: She is alert and oriented to person, place, and time.  Skin: Skin is warm and dry.     Assessment/Plan Impression: Partial small bowel obstruction, leukocytosis improving. We'll get urine culture as I doubt she has significant intra-abdominal pathology to explain the leukocytosis. No need for acute surgical intervention. We'll continue NG tube decompression.  France Lusty A 07/09/2013, 10:24 AM

## 2013-07-09 NOTE — Progress Notes (Signed)
NG tube placed by Ophelia Shoulder, RN. Placement verified by this nurse and Ophelia Shoulder, RN. Patient tolerated well. NG tube to LIS pulled 800cc of clear yellow fluid immediately. Will continue to monitor.

## 2013-07-09 NOTE — Progress Notes (Signed)
Patient continues to refuse NG tube for tonight.

## 2013-07-09 NOTE — Progress Notes (Signed)
Patient vomited a large amount of clear yellow liquid. This nurse advised patient that the ng tube would be beneficial. Patient agreed. NG tube will be placed.

## 2013-07-10 DIAGNOSIS — R109 Unspecified abdominal pain: Secondary | ICD-10-CM | POA: Diagnosis not present

## 2013-07-10 DIAGNOSIS — K56609 Unspecified intestinal obstruction, unspecified as to partial versus complete obstruction: Secondary | ICD-10-CM | POA: Diagnosis not present

## 2013-07-10 LAB — PHOSPHORUS: Phosphorus: 2.6 mg/dL (ref 2.3–4.6)

## 2013-07-10 LAB — CBC
HEMATOCRIT: 38 % (ref 36.0–46.0)
HEMOGLOBIN: 12.2 g/dL (ref 12.0–15.0)
MCH: 30.3 pg (ref 26.0–34.0)
MCHC: 32.1 g/dL (ref 30.0–36.0)
MCV: 94.5 fL (ref 78.0–100.0)
Platelets: 240 10*3/uL (ref 150–400)
RBC: 4.02 MIL/uL (ref 3.87–5.11)
RDW: 13.6 % (ref 11.5–15.5)
WBC: 9.7 10*3/uL (ref 4.0–10.5)

## 2013-07-10 LAB — BASIC METABOLIC PANEL
BUN: 9 mg/dL (ref 6–23)
CHLORIDE: 105 meq/L (ref 96–112)
CO2: 28 mEq/L (ref 19–32)
Calcium: 8.5 mg/dL (ref 8.4–10.5)
Creatinine, Ser: 0.59 mg/dL (ref 0.50–1.10)
GFR calc Af Amer: >90 mL/min (ref >90)
GFR calc non Af Amer: >90 mL/min (ref >90)
Glucose, Bld: 105 mg/dL — ABNORMAL HIGH (ref 70–99)
POTASSIUM: 3.8 meq/L (ref 3.7–5.3)
Sodium: 141 mEq/L (ref 137–147)

## 2013-07-10 LAB — MAGNESIUM: Magnesium: 1.9 mg/dL (ref 1.5–2.5)

## 2013-07-10 MED ORDER — LOSARTAN POTASSIUM 50 MG PO TABS
50.0000 mg | ORAL_TABLET | Freq: Every day | ORAL | Status: DC
  Administered 2013-07-10 – 2013-07-11 (×2): 50 mg via ORAL
  Filled 2013-07-10 (×2): qty 1

## 2013-07-10 MED ORDER — AMLODIPINE BESYLATE 5 MG PO TABS
5.0000 mg | ORAL_TABLET | Freq: Every day | ORAL | Status: DC
  Administered 2013-07-10 – 2013-07-11 (×2): 5 mg via ORAL
  Filled 2013-07-10 (×3): qty 1

## 2013-07-10 NOTE — Progress Notes (Signed)
Subjective: Feels much better. Minimal abdominal pain. Did have large flatus.  Objective: Vital signs in last 24 hours: Temp:  [97.6 F (36.4 C)-98.1 F (36.7 C)] 97.9 F (36.6 C) (02/08 0429) Pulse Rate:  [67-75] 67 (02/08 0429) Resp:  [18] 18 (02/08 0429) BP: (145-184)/(93-97) 150/94 mmHg (02/08 0429) SpO2:  [94 %-97 %] 97 % (02/08 0429) Last BM Date: 07/08/13  Intake/Output from previous day: 02/07 0701 - 02/08 0700 In: 1597.9 [P.O.:150; I.V.:1447.9] Out: 1350 [Urine:250; Emesis/NG output:1100] Intake/Output this shift: Total I/O In: 1816.7 [I.V.:1816.7] Out: 600 [Urine:600]  General appearance: alert, cooperative and no distress Resp: clear to auscultation bilaterally Cardio: regular rate and rhythm, S1, S2 normal, no murmur, click, rub or gallop GI: soft, non-tender; bowel sounds normal; no masses,  no organomegaly  Lab Results:   Recent Labs  07/09/13 0711 07/10/13 0640  WBC 18.2* 9.7  HGB 14.1 12.2  HCT 42.9 38.0  PLT 309 240   BMET  Recent Labs  07/09/13 0711 07/10/13 0640  NA 137 141  K 4.1 3.8  CL 100 105  CO2 25 28  GLUCOSE 149* 105*  BUN 18 9  CREATININE 0.68 0.59  CALCIUM 8.8 8.5   PT/INR No results found for this basename: LABPROT, INR,  in the last 72 hours  Studies/Results: Dg Chest 2 View  07/08/2013   CLINICAL DATA:  Epigastric abdominal pain and distention.  EXAM: CHEST  2 VIEW  COMPARISON:  02/12/2013.  FINDINGS: The heart remains normal in size and the lungs are clear with normal vascularity. Minimal scoliosis. Minimal thoracic spine degenerative changes.  IMPRESSION: No acute abnormality.   Electronically Signed   By: Enrique Sack M.D.   On: 07/08/2013 21:53   Ct Abdomen Pelvis W Contrast  07/08/2013   CLINICAL DATA:  68 year old female with worsening midline abdominal pain, vomiting. History of small bowel obstruction requiring surgery. Initial encounter. Appendectomy and hysterectomy.  EXAM: CT ABDOMEN AND PELVIS WITH CONTRAST   TECHNIQUE: Multidetector CT imaging of the abdomen and pelvis was performed using the standard protocol following bolus administration of intravenous contrast.  CONTRAST:  79mL OMNIPAQUE IOHEXOL 300 MG/ML SOLN, 111mL OMNIPAQUE IOHEXOL 300 MG/ML SOLN  COMPARISON:  CT Abdomen and Pelvis 02/13/2003.  FINDINGS: No pericardial or pleural effusion. Stable lung bases with mild atelectasis or scarring.  No acute osseous abnormality identified.  Trace pelvic free fluid. Bladder is decompressed. Distal colon is decompressed. The sigmoid is redundant. Left colon is decompressed. Transverse colon is decompressed. Occasional diverticula. Right colon is largely decompressed. There is some contrast material in the cecum which appears to be unrelated to the current exam. The terminal ileum is decompressed.  In the right lower quadrant there is an abrupt transition point from mildly dilated distal small bowel with loculated contents (diameter 31 mm) to decompressed bowel (series 2, image 68, coronal image 34). The upstream small bowel loops are diffusely dilated and fluid-filled. Maximum small bowel diameter 34 mm. No abdominal free fluid.  Large volume of oral contrast distending the stomach and has only reached the duodenum bulb. There is some retained contrast in nondilated distal esophagus. The duodenum and ligament of Treitz are decompressed.  Mildly decreased density throughout the liver. Gallbladder, spleen, pancreas (fatty infiltrated) and adrenal glands are within normal limits. Portal venous system within normal limits. Haziness at the root of the small bowel mesentery. Small mesenteric nodes are stable. No free air. Stable and negative kidneys and ureters. Mild Aortoiliac calcified atherosclerosis noted. Major arterial structures in  the abdomen and pelvis are patent. No lymphadenopathy.  IMPRESSION: 1. Moderate grade small bowel obstruction with focal transition point in the right lower quadrant in the distal small bowel.  Similar appearance to the September 2014 study. 2. Small volume pelvic free fluid, likely reactive.   Electronically Signed   By: Lars Pinks M.D.   On: 07/08/2013 22:46    Anti-infectives: Anti-infectives   None      Assessment/Plan: Impression: Partial small bowel obstruction, resolving. Leukocytosis, resolved. Plan: We will remove gastric tube. Will advance diet as tolerated. No need for surgical intervention at this time.  LOS: 2 days    [name] [name] A 07/10/2013

## 2013-07-11 DIAGNOSIS — K56609 Unspecified intestinal obstruction, unspecified as to partial versus complete obstruction: Secondary | ICD-10-CM | POA: Diagnosis not present

## 2013-07-11 DIAGNOSIS — R109 Unspecified abdominal pain: Secondary | ICD-10-CM | POA: Diagnosis not present

## 2013-07-11 NOTE — Discharge Summary (Signed)
Physician Discharge Summary  Patient ID: [name] [name] MRN: [id number] DOB/AGE: [birth year] 67 y.o.  Admit date: 07/08/2013 Discharge date: 07/11/2013  Admission Diagnoses: Partial small bowel obstruction  Discharge Diagnoses: Same, resolved Active Problems:   Small bowel obstruction   Discharged Condition: good  Hospital Course: Patient is a 68 year old white female status post exploratory laparotomy with lysis of adhesions in September 2014 who presented with a several-day history of worsening abdominal pain and bloating. She did have some nausea, but no significant emesis. CT scan of the abdomen did reveal a partial small bowel obstruction. She was admitted to the hospital for bowel rest and IV hydration. Her NG tube was removed on 07/10/2013. Her diet was advanced without difficulty. She is passing flatus. She is being discharged home in good improving condition.  Discharge Exam: Blood pressure 147/86, pulse 67, temperature 97.5 F (36.4 C), temperature source Oral, resp. rate 18, height 5\' 4"  (1.626 m), weight 87.5 kg (192 lb 14.4 oz), SpO2 97.00%. General appearance: alert, cooperative and no distress Resp: clear to auscultation bilaterally Cardio: regular rate and rhythm, S1, S2 normal, no murmur, click, rub or gallop GI: soft, non-tender; bowel sounds normal; no masses,  no organomegaly  Disposition: 01-Home or Self Care     Medication List         amLODipine 5 MG tablet  Commonly known as:  NORVASC  Take 5 mg by mouth daily.     aspirin EC 81 MG tablet  Take 81 mg by mouth daily.     atorvastatin 10 MG tablet  Commonly known as:  LIPITOR  Take 10 mg by mouth daily.     beta carotene w/minerals tablet  Take 1 tablet by mouth daily.     CALCIUM 600 + D PO  Take 1 tablet by mouth 2 (two) times daily.     cholecalciferol 1000 UNITS tablet  Commonly known as:  VITAMIN D  Take 1,000 Units by mouth daily.     Fish Oil 1200 MG Caps  Take 1,200 mg by mouth 2  (two) times daily.     GLUCOS-CHONDROIT-MSM COMPLEX PO  Take 1 capsule by mouth 2 (two) times daily.     ibuprofen 800 MG tablet  Commonly known as:  ADVIL,MOTRIN  Take 800 mg by mouth every 8 (eight) hours as needed. pain     losartan 50 MG tablet  Commonly known as:  COZAAR  Take 50 mg by mouth daily.     multivitamin with minerals Tabs tablet  Take 1 tablet by mouth daily.     omeprazole 40 MG capsule  Commonly known as:  PRILOSEC  Take 40 mg by mouth daily.     Potassium 99 MG Tabs  Take 1 tablet by mouth daily.           Follow-up Information   Follow up with Jamesetta So, MD. (As needed)    Specialty:  General Surgery   Contact information:   [address] [place] [address] [phone]       Signed: Aviva Signs A 07/11/2013, 9:30 AM

## 2013-07-11 NOTE — Discharge Instructions (Signed)
Intestinal Obstruction °An intestinal obstruction is a blockage of the intestine. It can be caused by a physical blockage or by a problem of abnormal function of the intestine.  °CAUSES  °· Adhesions from previous surgeries. °· Cancer or tumor. °· A hernia, which is a condition in which a portion of the bowel bulges out through an opening or weakness in the abdomen. This sometimes squeezes the bowel. °· A swallowed object. °· Blockage (impaction) with worms is common in third world countries. °· A twisting of the bowel or telescoping of a portion of the bowel into another portion (intussusceptions). °· Anything that stops food from going through from the stomach to the anus. °SYMPTOMS  °Symptoms of bowel obstruction may include abdominal bloating, nausea, vomiting, explosive diarrhea, or explosive stool. You may not be able to hear your normal bowel sounds (such as "growling in your stomach"). You may also stop having bowel movements or passing gas. °DIAGNOSIS  °Usually this condition is diagnosed with a history and an examination. Often, lab studies (blood work) and X-rays may be used to find the cause. °TREATMENT  °The main treatment for this condition is to rest the intestine. Often, the obstruction may relieve itself and allow the intestine to start working again. Think of the intestine like a balloon that is blown up (filled with trapped food and water that has squeezed into a hole or area that it cannot get through).  °· If the obstruction is complete, a nasogastric (NG) tube is passed through the nose and into the stomach. It is then connected to suction to keep the stomach emptied out. This also helps treat the nausea and vomiting. °· If there is an imbalance in the electrolytes, they are corrected with intravenous fluids. These fluids have the proper chemicals in them to correct the problem. °· If the reason for the blockage does not get better with conservative (nonsurgical) treatment, surgery may be  necessary. Sometimes, surgery is done immediately if your surgeon knows that the problem is not going to get better with conservative treatment. °PROGNOSIS  °Depending on what the problem is, most of these problems can be treated by your caregivers with good results. Your caregiver will discuss with you the best course of action to take. °FOLLOWING SURGERY °Seek immediate medical attention if you have: °· Increasing abdominal pain, repeated vomiting, dehydration, or fainting. °· Severe weakness, chest pain, or back pain. °· Blood in your vomit or stool. °· Tarry stool. °Document Released: 08/09/2003 Document Revised: 09/13/2012 Document Reviewed: 01/07/2008 °[employer]® Patient Information ©2014 [employer]. ° °

## 2013-07-11 NOTE — Progress Notes (Signed)
IV removed, site WNL.  Pt given d/c instructions, no new prescriptions ordered.  Pt instructed to continue taking home medications as previously prescribed. Discussed home care with patient, teachback completed. F/U appointment to be made if needed by patient.  Pt is stable at this time and verbalizes understanding of diagnosis and discharge care.

## 2013-07-13 LAB — URINE CULTURE: Colony Count: 50000

## 2013-07-15 DIAGNOSIS — K56609 Unspecified intestinal obstruction, unspecified as to partial versus complete obstruction: Secondary | ICD-10-CM | POA: Diagnosis not present

## 2013-07-15 DIAGNOSIS — Z0000 Encounter for general adult medical examination without abnormal findings: Secondary | ICD-10-CM | POA: Diagnosis not present

## 2013-07-15 DIAGNOSIS — Z6832 Body mass index (BMI) 32.0-32.9, adult: Secondary | ICD-10-CM | POA: Diagnosis not present

## 2013-07-18 DIAGNOSIS — Z0000 Encounter for general adult medical examination without abnormal findings: Secondary | ICD-10-CM | POA: Diagnosis not present

## 2013-07-18 DIAGNOSIS — Z79899 Other long term (current) drug therapy: Secondary | ICD-10-CM | POA: Diagnosis not present

## 2013-08-04 ENCOUNTER — Encounter (INDEPENDENT_AMBULATORY_CARE_PROVIDER_SITE_OTHER): Payer: Self-pay | Admitting: *Deleted

## 2013-08-15 ENCOUNTER — Encounter (INDEPENDENT_AMBULATORY_CARE_PROVIDER_SITE_OTHER): Payer: Self-pay | Admitting: Internal Medicine

## 2013-08-15 ENCOUNTER — Ambulatory Visit (INDEPENDENT_AMBULATORY_CARE_PROVIDER_SITE_OTHER): Payer: Medicare Other | Admitting: Internal Medicine

## 2013-08-15 VITALS — BP 112/64 | HR 72 | Temp 98.1°F | Ht 64.0 in | Wt 191.4 lb

## 2013-08-15 DIAGNOSIS — K56609 Unspecified intestinal obstruction, unspecified as to partial versus complete obstruction: Secondary | ICD-10-CM | POA: Diagnosis not present

## 2013-08-15 NOTE — Progress Notes (Signed)
Subjective:     Patient ID: [name] [name], female   DOB: 08-22-1945, 68 y.o.   MRN: [id number]  HPI Referred to our office for hx of SBO. She was in the hospital in September of 2014 and February of this year for SBO. Today she says she feels fine. She basically is here for prevention. Appetite is good. No weight loss. She usually has a BM daily. She takes Metamucil on a daily basis.     02/12/2013: Exploratory Laparotomy  Preop Diagnosis: Small bowel obstruction  Postop Diagnosis: Same, adhesive disease  Procedure: Exploratory laparotomy     2/16/2015CT abdomen/pelvis with CM: IMPRESSION:  1. Moderate grade small bowel obstruction with focal transition  point in the right lower quadrant in the distal small bowel. Similar  appearance to the September 2014 study.  2. Small volume pelvic free fluid, likely reactive.   03/18/2012 Colonoscopy Screening:: Cecal Withdrawal Time: 12 minutes  Impression:  Examination performed to cecum.  No evidence of colonic polyps.  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      Review of Systems Past Medical History  Diagnosis Date  . H/O hiatal hernia     Past Surgical History  Procedure Laterality Date  . Appendectomy    . Abdominal hysterectomy    . Colonoscopy  03/18/2012    Procedure: COLONOSCOPY;  Surgeon: Rogene Houston, MD;  Location: [facility];  Service: Endoscopy;  Laterality: N/A;  930  . Laparotomy N/A 02/14/2013    Procedure: EXPLORATORY LAPAROTOMY;  Surgeon: Jamesetta So, MD;  Location: [facility];  Service: General;  Laterality: N/A;    No Known Allergies  Current Outpatient Prescriptions on File Prior to Visit  Medication Sig Dispense Refill  . amLODipine (NORVASC) 5 MG tablet Take 5 mg by mouth daily.      Marland Kitchen aspirin EC 81 MG tablet Take 81 mg by mouth daily.      Marland Kitchen atorvastatin (LIPITOR) 10 MG tablet Take 10 mg by mouth daily.      . beta carotene w/minerals  (OCUVITE) tablet Take 1 tablet by mouth daily.      . Calcium Carbonate-Vitamin D (CALCIUM 600 + D PO) Take 1 tablet by mouth 2 (two) times daily.      . cholecalciferol (VITAMIN D) 1000 UNITS tablet Take 1,000 Units by mouth daily.      Marland Kitchen ibuprofen (ADVIL,MOTRIN) 800 MG tablet Take 800 mg by mouth every 8 (eight) hours as needed. pain      . losartan (COZAAR) 50 MG tablet Take 50 mg by mouth daily.      . Misc Natural Products (GLUCOS-CHONDROIT-MSM COMPLEX PO) Take 1 capsule by mouth 2 (two) times daily.      . Multiple Vitamin (MULTIVITAMIN WITH MINERALS) TABS Take 1 tablet by mouth daily.      . Omega-3 Fatty Acids (FISH OIL) 1200 MG CAPS Take 1,200 mg by mouth 2 (two) times daily.      Marland Kitchen omeprazole (PRILOSEC) 40 MG capsule Take 40 mg by mouth daily.      . Potassium 99 MG TABS Take 1 tablet by mouth daily.       No current facility-administered medications on file prior to visit.        Objective:   Physical Exam  Filed Vitals:   08/15/13 1525  BP: 112/64  Pulse: 72  Temp: 98.1 F (36.7 C)  Height: 5\' 4"  (1.626 m)  Weight: 191 lb 6.4  oz (86.818 kg)   Alert and oriented. Skin warm and dry. Oral mucosa is moist.   . Sclera anicteric, conjunctivae is pink. Thyroid not enlarged. No cervical lymphadenopathy. Lungs clear. Heart regular rate and rhythm.  Abdomen is soft. Bowel sounds are positive. No hepatomegaly. No abdominal masses felt. No tenderness.  No edema to lower extremities.       Assessment:     Hx of SBO. Asymptomatic now. Feels good.     Plan:     Continue the Metamucil. Need to exercise daily.

## 2013-09-27 ENCOUNTER — Other Ambulatory Visit: Payer: Self-pay

## 2013-09-27 DIAGNOSIS — Z1231 Encounter for screening mammogram for malignant neoplasm of breast: Secondary | ICD-10-CM

## 2013-10-01 ENCOUNTER — Encounter (HOSPITAL_COMMUNITY): Payer: Self-pay | Admitting: Emergency Medicine

## 2013-10-01 ENCOUNTER — Inpatient Hospital Stay (HOSPITAL_COMMUNITY)
Admission: EM | Admit: 2013-10-01 | Discharge: 2013-10-03 | DRG: 390 | Disposition: A | Discharge status: Home / Self Care | Payer: Medicare Other | Attending: General Surgery | Admitting: General Surgery

## 2013-10-01 ENCOUNTER — Inpatient Hospital Stay (HOSPITAL_COMMUNITY): Payer: Medicare Other

## 2013-10-01 ENCOUNTER — Emergency Department (HOSPITAL_COMMUNITY): Payer: Medicare Other

## 2013-10-01 DIAGNOSIS — R1013 Epigastric pain: Secondary | ICD-10-CM | POA: Diagnosis not present

## 2013-10-01 DIAGNOSIS — K5669 Other intestinal obstruction: Secondary | ICD-10-CM | POA: Diagnosis not present

## 2013-10-01 DIAGNOSIS — I10 Essential (primary) hypertension: Secondary | ICD-10-CM | POA: Diagnosis not present

## 2013-10-01 DIAGNOSIS — K56609 Unspecified intestinal obstruction, unspecified as to partial versus complete obstruction: Principal | ICD-10-CM | POA: Diagnosis present

## 2013-10-01 DIAGNOSIS — Z7982 Long term (current) use of aspirin: Secondary | ICD-10-CM | POA: Diagnosis not present

## 2013-10-01 DIAGNOSIS — E785 Hyperlipidemia, unspecified: Secondary | ICD-10-CM | POA: Diagnosis not present

## 2013-10-01 DIAGNOSIS — R1033 Periumbilical pain: Secondary | ICD-10-CM | POA: Diagnosis not present

## 2013-10-01 DIAGNOSIS — R112 Nausea with vomiting, unspecified: Secondary | ICD-10-CM | POA: Diagnosis not present

## 2013-10-01 DIAGNOSIS — Z79899 Other long term (current) drug therapy: Secondary | ICD-10-CM | POA: Diagnosis not present

## 2013-10-01 DIAGNOSIS — J9819 Other pulmonary collapse: Secondary | ICD-10-CM | POA: Diagnosis not present

## 2013-10-01 DIAGNOSIS — R1031 Right lower quadrant pain: Secondary | ICD-10-CM | POA: Diagnosis not present

## 2013-10-01 LAB — TROPONIN I: Troponin I: <0.3 ng/mL (ref <0.30)

## 2013-10-01 LAB — BASIC METABOLIC PANEL
BUN: 13 mg/dL (ref 6–23)
CALCIUM: 8.2 mg/dL — AB (ref 8.4–10.5)
CO2: 23 meq/L (ref 19–32)
Chloride: 103 mEq/L (ref 96–112)
Creatinine, Ser: 0.6 mg/dL (ref 0.50–1.10)
GFR calc Af Amer: >90 mL/min (ref >90)
GLUCOSE: 144 mg/dL — AB (ref 70–99)
Potassium: 4.1 mEq/L (ref 3.7–5.3)
Sodium: 140 mEq/L (ref 137–147)

## 2013-10-01 LAB — CBC WITH DIFFERENTIAL/PLATELET
BASOS ABS: 0.1 10*3/uL (ref 0.0–0.1)
BASOS PCT: 0 % (ref 0–1)
Eosinophils Absolute: 0 10*3/uL (ref 0.0–0.7)
Eosinophils Relative: 0 % (ref 0–5)
HEMATOCRIT: 43.7 % (ref 36.0–46.0)
Hemoglobin: 14.7 g/dL (ref 12.0–15.0)
Lymphocytes Relative: 10 % — ABNORMAL LOW (ref 12–46)
Lymphs Abs: 1.7 10*3/uL (ref 0.7–4.0)
MCH: 30.8 pg (ref 26.0–34.0)
MCHC: 33.6 g/dL (ref 30.0–36.0)
MCV: 91.6 fL (ref 78.0–100.0)
Monocytes Absolute: 1 10*3/uL (ref 0.1–1.0)
Monocytes Relative: 6 % (ref 3–12)
Neutro Abs: 14.6 10*3/uL — ABNORMAL HIGH (ref 1.7–7.7)
Neutrophils Relative %: 84 % — ABNORMAL HIGH (ref 43–77)
Platelets: 322 10*3/uL (ref 150–400)
RBC: 4.77 MIL/uL (ref 3.87–5.11)
RDW: 12.7 % (ref 11.5–15.5)
WBC: 17.4 10*3/uL — ABNORMAL HIGH (ref 4.0–10.5)

## 2013-10-01 LAB — COMPREHENSIVE METABOLIC PANEL
ALBUMIN: 3.8 g/dL (ref 3.5–5.2)
ALT: 46 U/L — ABNORMAL HIGH (ref 0–35)
AST: 37 U/L (ref 0–37)
Alkaline Phosphatase: 84 U/L (ref 39–117)
BUN: 10 mg/dL (ref 6–23)
CALCIUM: 9 mg/dL (ref 8.4–10.5)
CO2: 24 mEq/L (ref 19–32)
CREATININE: 0.63 mg/dL (ref 0.50–1.10)
Chloride: 99 mEq/L (ref 96–112)
GFR calc Af Amer: >90 mL/min (ref >90)
Glucose, Bld: 161 mg/dL — ABNORMAL HIGH (ref 70–99)
Potassium: 4 mEq/L (ref 3.7–5.3)
Sodium: 137 mEq/L (ref 137–147)
Total Bilirubin: 0.8 mg/dL (ref 0.3–1.2)
Total Protein: 7.1 g/dL (ref 6.0–8.3)

## 2013-10-01 LAB — PROTIME-INR
INR: 0.98 (ref 0.00–1.49)
Prothrombin Time: 12.8 seconds (ref 11.6–15.2)

## 2013-10-01 LAB — LIPASE, BLOOD: LIPASE: 32 U/L (ref 11–59)

## 2013-10-01 MED ORDER — ACETAMINOPHEN 650 MG RE SUPP: 650.0000 mg | Freq: Four times a day (QID) | RECTAL | Status: DC | PRN

## 2013-10-01 MED ORDER — METOCLOPRAMIDE HCL 5 MG/ML IJ SOLN
10.0000 mg | Freq: Once | INTRAMUSCULAR | Status: AC
  Administered 2013-10-01: 10 mg via INTRAVENOUS
  Filled 2013-10-01: qty 2

## 2013-10-01 MED ORDER — ONDANSETRON HCL 4 MG/2ML IJ SOLN: 4.0000 mg | Freq: Three times a day (TID) | INTRAMUSCULAR | Status: DC | PRN

## 2013-10-01 MED ORDER — HYDROMORPHONE HCL PF 1 MG/ML IJ SOLN
1.0000 mg | Freq: Once | INTRAMUSCULAR | Status: AC
  Administered 2013-10-01: 1 mg via INTRAVENOUS
  Filled 2013-10-01: qty 1

## 2013-10-01 MED ORDER — ONDANSETRON HCL 4 MG/2ML IJ SOLN
4.0000 mg | Freq: Once | INTRAMUSCULAR | Status: AC
  Administered 2013-10-01: 4 mg via INTRAVENOUS
  Filled 2013-10-01: qty 2

## 2013-10-01 MED ORDER — SODIUM CHLORIDE 0.9 % IV BOLUS (SEPSIS)
500.0000 mL | Freq: Once | INTRAVENOUS | Status: AC
  Administered 2013-10-01: 500 mL via INTRAVENOUS

## 2013-10-01 MED ORDER — HYDROMORPHONE HCL PF 1 MG/ML IJ SOLN: 1.0000 mg | INTRAMUSCULAR | Status: AC | PRN

## 2013-10-01 MED ORDER — ACETAMINOPHEN 325 MG PO TABS: 650.0000 mg | ORAL_TABLET | Freq: Four times a day (QID) | ORAL | Status: DC | PRN

## 2013-10-01 MED ORDER — MORPHINE SULFATE 4 MG/ML IJ SOLN
4.0000 mg | Freq: Once | INTRAMUSCULAR | Status: AC
  Administered 2013-10-01: 4 mg via INTRAVENOUS
  Filled 2013-10-01: qty 1

## 2013-10-01 MED ORDER — ONDANSETRON HCL 4 MG/2ML IJ SOLN
4.0000 mg | Freq: Four times a day (QID) | INTRAMUSCULAR | Status: DC
  Administered 2013-10-01 – 2013-10-02 (×4): 4 mg via INTRAVENOUS
  Filled 2013-10-01 (×6): qty 2

## 2013-10-01 MED ORDER — MORPHINE SULFATE 2 MG/ML IJ SOLN: 1.0000 mg | INTRAMUSCULAR | Status: DC | PRN

## 2013-10-01 MED ORDER — POTASSIUM CHLORIDE IN NACL 20-0.9 MEQ/L-% IV SOLN
Freq: Once | INTRAVENOUS | Status: AC
  Administered 2013-10-01: 17:00:00 via INTRAVENOUS

## 2013-10-01 MED ORDER — DOCUSATE SODIUM 100 MG PO CAPS
100.0000 mg | ORAL_CAPSULE | Freq: Every day | ORAL | Status: DC
  Administered 2013-10-01 – 2013-10-03 (×3): 100 mg via ORAL
  Filled 2013-10-01 (×3): qty 1

## 2013-10-01 MED ORDER — POTASSIUM CHLORIDE IN NACL 20-0.9 MEQ/L-% IV SOLN
INTRAVENOUS | Status: DC
  Administered 2013-10-01 – 2013-10-03 (×5): via INTRAVENOUS

## 2013-10-01 NOTE — ED Notes (Signed)
Pt c/o central abdominal pain along with n/v/d that began this morning.

## 2013-10-01 NOTE — Progress Notes (Signed)
Admit 68 yr old [name] [name]. Female with approx. 24 hr hx of crampy abdominal pain accompanied with nausea vomiting.  She has had previous abdominal surgeries which include an appendectomy and a TAH BSO.  20 prior to an exploratory lap in 2014 for a bowel obstruction.  She was readmitted for partial obstruction in Feb 2014 which resolved nonoperatively and now presents with essentially the same clinical picture.  Clinically pt's abdomen is non tender and there are a few audible tinkles and she states that she feels better now with NG and she has even passed some flatus.  Have reviewed films and labs and will admit and observe, I think she is showing signs of resolving partial obstruction.  Filed Vitals:   10/01/13 1400  BP: 119/78  Pulse: 77  Temp:   Resp:   afebrile.  H7P dictated, dict. # [id number].

## 2013-10-01 NOTE — Consult Note (Signed)
[name], [name] NO.:  [id number]  MEDICAL RECORD NO.:  [id number]  LOCATION:  A330                          FACILITY:  [facility]  PHYSICIAN:  Felicie Morn, M.D. DATE OF BIRTH:  04-11-1946  DATE OF CONSULTATION:  10/01/2013 DATE OF DISCHARGE:                                CONSULTATION   NOTE:  Surgery was asked to see this 68 year old obese, white female for small bowel obstruction.  HISTORY OF PRESENT MEDICAL ILLNESS:  The patient states that approximately 24 hours ago, she had crampy abdominal pain localized more in the right lower quadrant and then became more periumbilical in nature.  This was accompanied later with nausea and vomiting, and some distention.  She came to the emergency room, at which time, of surgery was consulted and I decided to admit the patient.  PAST MEDICAL HISTORY:  She is a patient of Dr. Arnoldo Morale who underwent a laparotomy for a small bowel obstruction in October of 2014 and, prior to this, her abdominal surgeries had consisted of a TAHBSO and an appendectomy.  After she was released from her laparotomy for a small bowel obstruction, she was readmitted to the hospital in February of 2015, at which time, she was admitted and observed, and NG suction and hydration were initiated and her small bowel obstruction resolved nonoperatively at that time.  She is now admitted again for partial small bowel obstruction with essentially the same the clinical picture.  PHYSICAL EXAMINATION:  VITAL SIGNS:  She is 5 feet and 4 inches, weighs about 195 pounds.  Her blood pressure is 119/78, pulse rate 77, she is afebrile. HEENT:  Head is normocephalic.  Eyes, extraocular movements are intact. Pupils are round and reactive to light and accommodation.  The patient has had previous retinal surgery on the left eye and previous bilateral cataract surgeries.  Nasal and oral mucosa are moist.  She has an NG tube placed at present, this has been  confirmed the position with a flat plate of the abdomen down the emergency room. NECK:  Supple and cylindrical.  Jugular veins are flat.  There is no thyromegaly, tracheal deviation or cervical adenopathy or bruits appreciated and no adenopathy at all. BREASTS:  Pendulous without any masses appreciated and no axillary masses. HEART:  Regular rhythm. LUNGS:  Clear. ABDOMEN:  The abdomen is soft.  She has no tenderness or guarding or rebound at present, and she states she feels better now.  The NG tube has been placed and she even states she has passed a little flatus.  I hear some audible tinkles in the abdomen.  She has no incisional hernia either in her previous cesarean site or in her midline incision by Dr. Arnoldo Morale. RECTAL:  Stool is guaiac-negative. EXTREMITIES:  Pulses are symmetrical without varicosities, clubbing or cyanosis, and there is no edema.  REVIEW OF SYSTEMS:  GI:  Past history of partial small bowel obstruction, for which, she is readmitted at this time.  No past history of hepatitis.  No past history of bright red rectal bleeding, black tarry stools, or unexplained weight loss.  No family history of colon cancer or irritable bowel syndrome or inflammatory bowel disease.  The patient had a  colonoscopy in 2014.  GU:  No history of dysuria or hematuria or nephrolithiasis.  ENDOCRINE:  No history of diabetes, thyroid disease, or adrenal problems.  MUSCULOSKELETAL:  The patient is obese.  She has had no previous orthopedic procedures. CARDIORESPIRATORY:  The patient patient has a history of hypertension. She also takes some Lipitor for mild hyperlipidemia.  She has had no past history of chest pain or myocardial infarction and she is a nonsmoker and nondrinker.  MEDICATIONS:  See please consult chart.  She has no known allergies.  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  LABORATORY DATA:  The patient has an elevated white count of 17,000,  I suspect this is hemoconcentration and dehydration.  Clinically, the patient does not appear septic at all and her abdomen is really quite soft.  Abdominal films show some dilated loops of small bowel.  REVIEW OF HISTORY AND PHYSICAL:  Therefore, [name] [name] is a 68 year old white female who has recurrent small bowel obstruction, I suspect this is partial in nature and will resolve with nonoperative care.  We will continue hydration and NG suction and encourage ambulation.  We will follow up in the morning with some abdominal films and I will follow her for any acute surgical changes.     Felicie Morn, M.D.     WB/MEDQ  D:  10/01/2013  T:  10/01/2013  Job:  [id number]  cc:   Jamesetta So, M.D. Fax: [phone]

## 2013-10-01 NOTE — ED Provider Notes (Signed)
CSN: [id number]     Arrival date & time 10/01/13  1029 History  This chart was scribed for Orpah Greek, MD by Roxan Diesel, ED scribe.  This patient was seen in room [facility] and the patient's care was started at 10:43 AM.   Chief Complaint  Patient presents with  . Abdominal Pain  . Emesis    The history is provided by the patient. No language interpreter was used.    HPI Comments: [name] [name] is a 68 y.o. female with h/o hiatal hernia who presents to the Emergency Department complaining of constant moderate abdominal pain that began this morning at 3:30 Am with associated nausea, vomiting and diarrhea.  Pt localizes pain across her mid-abdomen.  Surgical history includes appendectomy, abdominal hysterectomy, colonoscopy and laparotomy.     Past Medical History  Diagnosis Date  . H/O hiatal hernia     Past Surgical History  Procedure Laterality Date  . Appendectomy    . Abdominal hysterectomy    . Colonoscopy  03/18/2012    Procedure: COLONOSCOPY;  Surgeon: Rogene Houston, MD;  Location: [facility];  Service: Endoscopy;  Laterality: N/A;  930  . Laparotomy N/A 02/14/2013    Procedure: EXPLORATORY LAPAROTOMY;  Surgeon: Jamesetta So, MD;  Location: [facility];  Service: General;  Laterality: N/A;    History reviewed. No pertinent family history.   History  Substance Use Topics  . Smoking status: Never Smoker   . Smokeless tobacco: Not on file  . Alcohol Use: No    OB History   Grav Para Term Preterm Abortions TAB SAB Ect Mult Living                   Review of Systems  Gastrointestinal: Positive for nausea, vomiting, abdominal pain and diarrhea.  All other systems reviewed and are negative.     Allergies  Review of patient's allergies indicates no known allergies.  Home Medications   Prior to Admission medications   Medication Sig Start Date End Date Taking? Authorizing Provider  amLODipine (NORVASC) 5 MG tablet Take 5 mg by mouth  daily.    Historical Provider, MD  aspirin EC 81 MG tablet Take 81 mg by mouth daily.    Historical Provider, MD  atorvastatin (LIPITOR) 10 MG tablet Take 10 mg by mouth daily.    Historical Provider, MD  beta carotene w/minerals (OCUVITE) tablet Take 1 tablet by mouth daily.    Historical Provider, MD  Calcium Carbonate-Vitamin D (CALCIUM 600 + D PO) Take 1 tablet by mouth 2 (two) times daily.    Historical Provider, MD  cholecalciferol (VITAMIN D) 1000 UNITS tablet Take 1,000 Units by mouth daily.    Historical Provider, MD  ibuprofen (ADVIL,MOTRIN) 800 MG tablet Take 800 mg by mouth every 8 (eight) hours as needed. pain    Historical Provider, MD  Lactobacillus-Inulin ([place] PO) Take by mouth.    Historical Provider, MD  losartan (COZAAR) 50 MG tablet Take 50 mg by mouth daily.    Historical Provider, MD  Misc Natural Products (GLUCOS-CHONDROIT-MSM COMPLEX PO) Take 1 capsule by mouth 2 (two) times daily.    Historical Provider, MD  Multiple Vitamin (MULTIVITAMIN WITH MINERALS) TABS Take 1 tablet by mouth daily.    Historical Provider, MD  Omega-3 Fatty Acids (FISH OIL) 1200 MG CAPS Take 1,200 mg by mouth 2 (two) times daily.    Historical Provider, MD  omeprazole (PRILOSEC) 40 MG capsule Take 40 mg  by mouth daily.    Historical Provider, MD  Potassium 99 MG TABS Take 1 tablet by mouth daily.    Historical Provider, MD  psyllium (METAMUCIL) 58.6 % packet Take 1 packet by mouth daily.    Historical Provider, MD   BP 122/96  Pulse 93  Temp(Src) 97.6 F (36.4 C) (Oral)  Resp 18  SpO2 100%  Physical Exam  Nursing note and vitals reviewed. Constitutional: She is oriented to person, place, and time. She appears well-developed and well-nourished. No distress.  HENT:  Head: Normocephalic and atraumatic.  Right Ear: Hearing normal.  Left Ear: Hearing normal.  Nose: Nose normal.  Mouth/Throat: Oropharynx is clear and moist and mucous membranes are normal.  Eyes:  Conjunctivae and EOM are normal. Pupils are equal, round, and reactive to light.  Neck: Normal range of motion. Neck supple.  Cardiovascular: Regular rhythm, S1 normal and S2 normal.  Exam reveals no gallop and no friction rub.   No murmur heard. Pulmonary/Chest: Effort normal and breath sounds normal. No respiratory distress. She exhibits no tenderness.  Abdominal: Soft. Normal appearance and bowel sounds are normal. There is no hepatosplenomegaly. There is tenderness (epigastric). There is no rebound, no guarding, no tenderness at McBurney's point and negative Murphy's sign. No hernia.  Musculoskeletal: Normal range of motion.  Neurological: She is alert and oriented to person, place, and time. She has normal strength. No cranial nerve deficit or sensory deficit. Coordination normal. GCS eye subscore is 4. GCS verbal subscore is 5. GCS motor subscore is 6.  Skin: Skin is warm, dry and intact. No rash noted. No cyanosis.  Psychiatric: She has a normal mood and affect. Her speech is normal and behavior is normal. Thought content normal.    ED Course  Procedures (including critical care time)  DIAGNOSTIC STUDIES: Oxygen Saturation is 100% on room air, normal by my interpretation.    COORDINATION OF CARE: 10:47 AM-Discussed treatment plan which includes records review with pt at bedside and pt agreed to plan.     Labs Review Labs Reviewed  CBC WITH DIFFERENTIAL - Abnormal; Notable for the following:    WBC 17.4 (*)    Neutrophils Relative % 84 (*)    Neutro Abs 14.6 (*)    Lymphocytes Relative 10 (*)    All other components within normal limits  COMPREHENSIVE METABOLIC PANEL - Abnormal; Notable for the following:    Glucose, Bld 161 (*)    ALT 46 (*)    All other components within normal limits  BASIC METABOLIC PANEL - Abnormal; Notable for the following:    Glucose, Bld 144 (*)    Calcium 8.2 (*)    All other components within normal limits  CBC WITH DIFFERENTIAL - Abnormal;  Notable for the following:    WBC 16.7 (*)    RBC 3.81 (*)    Hemoglobin 11.8 (*)    HCT 35.6 (*)    Neutro Abs 11.0 (*)    Monocytes Absolute 1.9 (*)    All other components within normal limits  BASIC METABOLIC PANEL - Abnormal; Notable for the following:    Calcium 7.9 (*)    All other components within normal limits  TROPONIN I  LIPASE, BLOOD  PROTIME-INR  CBC WITH DIFFERENTIAL    Imaging Review Dg Abd 1 View  10/01/2013   CLINICAL DATA:  Status post nasogastric tube placement.  EXAM: ABDOMEN - 1 VIEW  COMPARISON:  10/02/2011 at 1222 hr  FINDINGS: Nasogastric tube passes below the  diaphragm to curl within the stomach.  Mild small bowel dilation is again noted consistent with a bowel obstruction.  IMPRESSION: Nasogastric tube well positioned within the stomach.   Electronically Signed   By: Lajean Manes M.D.   On: 10/01/2013 15:08   Dg Abd Acute W/chest  10/01/2013   CLINICAL DATA:  Pain.  EXAM: ACUTE ABDOMEN SERIES (ABDOMEN 2 VIEW & CHEST 1 VIEW)  COMPARISON:  None.  FINDINGS: Chest x-ray reveals mild bibasilar atelectasis and is otherwise unremarkable. Small bowel dilatation with a paucity of colonic gas noted. These findings are consistent with small bowel obstruction. Similar finding noted on prior CT of 07/08/2013. No free air. Calcified pelvic phleboliths. Calcified density over the right abdomen most likely a calcified lymph node and was present on prior CT. No acute bony abnormality identified.  IMPRESSION: Findings consistent with small-bowel obstruction.  No free air.   Electronically Signed   By: Marcello Moores  Register   On: 10/01/2013 12:52     EKG Interpretation None      MDM   Final diagnoses:  None    Patient presents to the ER for evaluation of abdominal pain with nausea and vomiting, small amount of diarrhea. 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 Workup today is consistent with small bowel obstruction. An NG tube was placed. Patient required  analgesia. 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 Patient to be admitted by Doctor Romona Curls, covering for her surgeon, Doctor Arnoldo Morale.    I personally performed the services described in this documentation, which was scribed in my presence. The recorded information has been reviewed and is accurate.     Orpah Greek, MD 10/02/13 [phone]

## 2013-10-02 ENCOUNTER — Inpatient Hospital Stay (HOSPITAL_COMMUNITY): Payer: Medicare Other

## 2013-10-02 DIAGNOSIS — K56609 Unspecified intestinal obstruction, unspecified as to partial versus complete obstruction: Secondary | ICD-10-CM | POA: Diagnosis not present

## 2013-10-02 LAB — BASIC METABOLIC PANEL
BUN: 11 mg/dL (ref 6–23)
CALCIUM: 7.9 mg/dL — AB (ref 8.4–10.5)
CO2: 24 mEq/L (ref 19–32)
CREATININE: 0.59 mg/dL (ref 0.50–1.10)
Chloride: 105 mEq/L (ref 96–112)
Glucose, Bld: 97 mg/dL (ref 70–99)
Potassium: 3.8 mEq/L (ref 3.7–5.3)
Sodium: 139 mEq/L (ref 137–147)

## 2013-10-02 LAB — CBC WITH DIFFERENTIAL/PLATELET
BASOS ABS: 0.1 10*3/uL (ref 0.0–0.1)
Basophils Relative: 0 % (ref 0–1)
EOS ABS: 0.2 10*3/uL (ref 0.0–0.7)
EOS PCT: 1 % (ref 0–5)
HEMATOCRIT: 35.6 % — AB (ref 36.0–46.0)
HEMOGLOBIN: 11.8 g/dL — AB (ref 12.0–15.0)
Lymphocytes Relative: 21 % (ref 12–46)
Lymphs Abs: 3.6 10*3/uL (ref 0.7–4.0)
MCH: 31 pg (ref 26.0–34.0)
MCHC: 33.1 g/dL (ref 30.0–36.0)
MCV: 93.4 fL (ref 78.0–100.0)
MONO ABS: 1.9 10*3/uL — AB (ref 0.1–1.0)
MONOS PCT: 11 % (ref 3–12)
Neutro Abs: 11 10*3/uL — ABNORMAL HIGH (ref 1.7–7.7)
Neutrophils Relative %: 66 % (ref 43–77)
Platelets: 267 10*3/uL (ref 150–400)
RBC: 3.81 MIL/uL — ABNORMAL LOW (ref 3.87–5.11)
RDW: 13.1 % (ref 11.5–15.5)
WBC: 16.7 10*3/uL — ABNORMAL HIGH (ref 4.0–10.5)

## 2013-10-02 MED ORDER — AMLODIPINE BESYLATE 5 MG PO TABS
5.0000 mg | ORAL_TABLET | Freq: Every day | ORAL | Status: DC
  Administered 2013-10-02 – 2013-10-03 (×2): 5 mg via ORAL
  Filled 2013-10-02 (×2): qty 1

## 2013-10-02 MED ORDER — PANTOPRAZOLE SODIUM 40 MG PO TBEC
40.0000 mg | DELAYED_RELEASE_TABLET | Freq: Every day | ORAL | Status: DC
  Administered 2013-10-02 – 2013-10-03 (×2): 40 mg via ORAL
  Filled 2013-10-02 (×2): qty 1

## 2013-10-02 MED ORDER — LOSARTAN POTASSIUM 50 MG PO TABS
50.0000 mg | ORAL_TABLET | Freq: Every day | ORAL | Status: DC
  Administered 2013-10-02 – 2013-10-03 (×2): 50 mg via ORAL
  Filled 2013-10-02 (×2): qty 1

## 2013-10-02 NOTE — Progress Notes (Signed)
Utilization review Completed Ramie Palladino RN BSN   

## 2013-10-02 NOTE — Progress Notes (Signed)
Filed Vitals:   10/02/13 0713  BP: 122/76  Pulse: 77  Temp: 98.2 F (36.8 C)  Resp: 18   Pt. Feels much better. She is passing gas and abdominal films show improvement with gas in rectum.  Abdomen clinically is benign.  NG tube this AM was in nose when I rounded and certainly wasn't in stomach.  WBC elevated, I don't know why but pt is afebrile without any localized symptoms.  Plan: will advance diet and activity.  D/C NG tube.

## 2013-10-03 DIAGNOSIS — R1033 Periumbilical pain: Secondary | ICD-10-CM | POA: Diagnosis not present

## 2013-10-03 DIAGNOSIS — R1031 Right lower quadrant pain: Secondary | ICD-10-CM | POA: Diagnosis not present

## 2013-10-03 DIAGNOSIS — R112 Nausea with vomiting, unspecified: Secondary | ICD-10-CM | POA: Diagnosis not present

## 2013-10-03 DIAGNOSIS — K56609 Unspecified intestinal obstruction, unspecified as to partial versus complete obstruction: Secondary | ICD-10-CM | POA: Diagnosis not present

## 2013-10-03 LAB — CBC WITH DIFFERENTIAL/PLATELET
BASOS ABS: 0.1 10*3/uL (ref 0.0–0.1)
BASOS PCT: 1 % (ref 0–1)
Eosinophils Absolute: 0.3 10*3/uL (ref 0.0–0.7)
Eosinophils Relative: 3 % (ref 0–5)
HEMATOCRIT: 34.6 % — AB (ref 36.0–46.0)
Hemoglobin: 11.4 g/dL — ABNORMAL LOW (ref 12.0–15.0)
LYMPHS PCT: 34 % (ref 12–46)
Lymphs Abs: 3.2 10*3/uL (ref 0.7–4.0)
MCH: 31.1 pg (ref 26.0–34.0)
MCHC: 32.9 g/dL (ref 30.0–36.0)
MCV: 94.3 fL (ref 78.0–100.0)
Monocytes Absolute: 1.1 10*3/uL — ABNORMAL HIGH (ref 0.1–1.0)
Monocytes Relative: 12 % (ref 3–12)
NEUTROS ABS: 4.7 10*3/uL (ref 1.7–7.7)
Neutrophils Relative %: 50 % (ref 43–77)
PLATELETS: 234 10*3/uL (ref 150–400)
RBC: 3.67 MIL/uL — ABNORMAL LOW (ref 3.87–5.11)
RDW: 13 % (ref 11.5–15.5)
WBC: 9.4 10*3/uL (ref 4.0–10.5)

## 2013-10-03 MED ORDER — DSS 100 MG PO CAPS
100.0000 mg | ORAL_CAPSULE | Freq: Every day | ORAL | Status: DC
Start: 2013-10-03 — End: 2018-03-17

## 2013-10-03 NOTE — Progress Notes (Signed)
10/03/13 1619 Patient left floor in stable condition this afternoon accompanied by nurse tech. Discharged home. Donavan Foil, RN

## 2013-10-03 NOTE — Care Management Note (Signed)
    Page 1 of 1   10/03/2013     2:33:18 PM CARE MANAGEMENT NOTE 10/03/2013  Patient:  [name] [name], [name] [name]   Account Number:  [id number]  Date Initiated:  10/03/2013  Documentation initiated by:  Claretha Cooper  Subjective/Objective Assessment:   Pt admitted from home. Very independent and will have no HH DME needs.     Action/Plan:   Anticipated DC Date:     Anticipated DC Plan:  HOME/SELF CARE      DC Planning Services  CM consult      Choice offered to / List presented to:             Status of service:  Completed, signed off Medicare Important Message given?   (If response is "NO", the following Medicare IM given date fields will be blank) Date Medicare IM given:   Date Additional Medicare IM given:    Discharge Disposition:    Per UR Regulation:    If discussed at Long Length of Stay Meetings, dates discussed:    Comments:  10/03/13 Claretha Cooper RN BSN CM

## 2013-10-03 NOTE — Discharge Instructions (Signed)
Small Bowel Obstruction A small bowel obstruction is a blockage (obstruction) of the small intestine (small bowel). The small bowel is a long, slender tube that connects the stomach to the colon. Its job is to absorb nutrients from the fluids and foods you consume into the bloodstream.  CAUSES  There are many causes of intestinal blockage. The most common ones include:  Hernias. This is a more common cause in children than adults.  Inflammatory bowel disease (enteritis and colitis).  Twisting of the bowel (volvulus).  Tumors.  Scar tissue (adhesions) from previous surgery or radiation treatment.  Recent surgery. This may cause an acute small bowel obstruction called an ileus. SYMPTOMS   Abdominal pain. This may be dull cramps or sharp pain. It may occur in one area or may be present in the entire abdomen. Pain can range from mild to severe, depending on the degree of obstruction.  Nausea and vomiting. Vomit may be greenish or yellow bile color.  Distended or swollen stomach. Abdominal bloating is a common symptom.  Constipation.  Lack of passing gas.  Frequent belching.  Diarrhea. This may occur if runny stool is able to leak around the obstruction. DIAGNOSIS  Your caregiver can usually diagnose small bowel obstruction by taking a history, doing a physical exam, and taking X-rays. If the cause is unclear, a CT scan (computerized tomography) of your abdomen and pelvis may be needed. TREATMENT  Treatment of the blockage depends on the cause and how bad the problem is.   Sometimes, the obstruction improves with bed rest and intravenous (IV) fluids.  Resting the bowel is very important. This means following a simple diet. Sometimes, a clear liquid diet may be required for several days.  Sometimes, a small tube (nasogastric tube) is placed into the stomach to decompress the bowel. When the bowel is blocked, it usually swells up like a balloon filled with air and fluids.  Decompression means that the air and fluids are removed by suction through that tube. This can help with pain, discomfort, and nausea. It can also help the obstruction resolve faster.  Surgery may be required if other treatments do not work. Bowel obstruction from a hernia may require early surgery and can be an emergency procedure. Adhesions that cause frequent or severe obstructions may also require surgery. HOME CARE INSTRUCTIONS If your bowel obstruction is only partial or incomplete, you may be allowed to go home.  Get plenty of rest.  Follow your diet as directed by your caregiver.  Only consume clear liquids until your condition improves.  Avoid solid foods as instructed. SEEK IMMEDIATE MEDICAL CARE IF:  You have increased pain or cramping.  You vomit blood.  You have uncontrolled vomiting or nausea.  You cannot drink fluids due to vomiting or pain.  You develop confusion.  You begin feeling very dry or thirsty (dehydrated).  You have severe bloating.  You have chills.  You have a fever.  You feel extremely weak or you faint. MAKE SURE YOU:  Understand these instructions.  Will watch your condition.  Will get help right away if you are not doing well or get worse. Document Released: 08/05/2005 Document Revised: 08/11/2011 Document Reviewed: 08/02/2010 [employer] Patient Information 2014 [employer].  

## 2013-10-03 NOTE — Progress Notes (Signed)
10/03/13 1527 Reviewed discharge instructions with patient. Given copy of AVS, f/u appointment information, home medication list reviewed. Reviewed small bowel obstruction discharge education sheet. Pt verbalized understanding of instructions when to call MD. IV site d/c'd, within normal limits. No c/o pain or nausea at this time. Pt in stable condition awaiting sister arrival for discharge home. Donavan Foil, RN

## 2013-10-03 NOTE — Progress Notes (Signed)
Filed Vitals:   10/03/13 0950  BP: 128/82  Pulse:   Temp:   Resp:   temp 97.6, pulse 67/min, 20  Pt feels "very good" and has tolerated PO well and has had several  BMs and no further abdominal pain or GI symptoms.  Abdomen is completely soft with normoactive bowel sounds.  Will discharge and F/U as OP.  IMP. Resolved partial SBO.  Discharge dict. #  [id number].

## 2013-10-03 NOTE — Progress Notes (Signed)
10/03/13 1134 Patient tolerating full liquid diet well, no complaints of abdominal pain or nausea. States scheduled zofran not needed. Ambulating in room and hallway frequently. Tolerated well. States has had two small, loose bowel movements this morning. Donavan Foil, RN

## 2013-10-04 NOTE — Discharge Summary (Signed)
NAMEADLEE, PAAR NO.:  [id number]  MEDICAL RECORD NO.:  [id number]  LOCATION:  A330                          FACILITY:  [facility]  PHYSICIAN:  Felicie Morn, M.D. DATE OF BIRTH:  Sep 26, 1945  DATE OF ADMISSION:  10/01/2013 DATE OF DISCHARGE:  05/04/2015LH                              DISCHARGE SUMMARY   DIAGNOSIS:  Partial small bowel obstruction.  NOTE:  This is a 68 year old white female who is a patient of Dr. Arnoldo Morale who presented with partial small bowel obstruction in the emergency room.  She was admitted and observed, this resolved nonoperatively.  At the time of her discharge, her abdomen was completely benign with no distention, nausea, or vomiting, and she was moving her bowels and tolerating p.o. without any nausea.  She will be discharged, and she is told to follow up with Dr. Arnoldo Morale or if he is still waiting to come by my office.  LABORATORY DATA:  Please review chart.  In essence, her white count was normal at the time of discharge.  She was admitted with a white count of 16.7, at discharge it was 9.4 with an H and H of 11.4 and 34.6. Electrolytes were grossly within normal limits.  Also, her serial films revealed  improvement.  Clinically, at the time of discharge, as stated her abdomen was completely benign.     Felicie Morn, M.D.     WB/MEDQ  D:  10/03/2013  T:  10/04/2013  Job:  [id number]  cc:   Dr. Arnoldo Morale

## 2013-10-31 ENCOUNTER — Ambulatory Visit
Admission: RE | Admit: 2013-10-31 | Discharge: 2013-10-31 | Disposition: A | Discharge status: Home / Self Care | Payer: Medicare Other | Source: Ambulatory Visit

## 2013-10-31 DIAGNOSIS — Z1231 Encounter for screening mammogram for malignant neoplasm of breast: Secondary | ICD-10-CM

## 2013-11-04 DIAGNOSIS — K5900 Constipation, unspecified: Secondary | ICD-10-CM | POA: Diagnosis not present

## 2013-11-08 DIAGNOSIS — R059 Cough, unspecified: Secondary | ICD-10-CM | POA: Diagnosis not present

## 2013-11-08 DIAGNOSIS — R05 Cough: Secondary | ICD-10-CM | POA: Diagnosis not present

## 2013-11-08 DIAGNOSIS — J329 Chronic sinusitis, unspecified: Secondary | ICD-10-CM | POA: Diagnosis not present

## 2013-11-08 DIAGNOSIS — Z6830 Body mass index (BMI) 30.0-30.9, adult: Secondary | ICD-10-CM | POA: Diagnosis not present

## 2013-12-27 DIAGNOSIS — S61409A Unspecified open wound of unspecified hand, initial encounter: Secondary | ICD-10-CM | POA: Diagnosis not present

## 2013-12-27 DIAGNOSIS — Z6833 Body mass index (BMI) 33.0-33.9, adult: Secondary | ICD-10-CM | POA: Diagnosis not present

## 2014-03-17 ENCOUNTER — Other Ambulatory Visit: Payer: Self-pay

## 2014-03-24 DIAGNOSIS — G5602 Carpal tunnel syndrome, left upper limb: Secondary | ICD-10-CM | POA: Diagnosis not present

## 2014-03-24 DIAGNOSIS — Z6834 Body mass index (BMI) 34.0-34.9, adult: Secondary | ICD-10-CM | POA: Diagnosis not present

## 2014-03-24 DIAGNOSIS — Z23 Encounter for immunization: Secondary | ICD-10-CM | POA: Diagnosis not present

## 2014-03-24 DIAGNOSIS — M778 Other enthesopathies, not elsewhere classified: Secondary | ICD-10-CM | POA: Diagnosis not present

## 2014-05-09 DIAGNOSIS — H04123 Dry eye syndrome of bilateral lacrimal glands: Secondary | ICD-10-CM | POA: Diagnosis not present

## 2014-05-09 DIAGNOSIS — H35342 Macular cyst, hole, or pseudohole, left eye: Secondary | ICD-10-CM | POA: Diagnosis not present

## 2014-05-09 DIAGNOSIS — Z961 Presence of intraocular lens: Secondary | ICD-10-CM | POA: Diagnosis not present

## 2014-05-09 DIAGNOSIS — H31092 Other chorioretinal scars, left eye: Secondary | ICD-10-CM | POA: Diagnosis not present

## 2014-06-20 ENCOUNTER — Emergency Department (HOSPITAL_COMMUNITY): Payer: Medicare Other

## 2014-06-20 ENCOUNTER — Encounter (HOSPITAL_COMMUNITY): Payer: Self-pay

## 2014-06-20 ENCOUNTER — Inpatient Hospital Stay (HOSPITAL_COMMUNITY)
Admission: EM | Admit: 2014-06-20 | Discharge: 2014-06-23 | DRG: 390 | Disposition: A | Discharge status: Home / Self Care | Payer: Medicare Other | Attending: Family Medicine | Admitting: Family Medicine

## 2014-06-20 DIAGNOSIS — R109 Unspecified abdominal pain: Secondary | ICD-10-CM | POA: Diagnosis present

## 2014-06-20 DIAGNOSIS — K5660 Unspecified intestinal obstruction: Secondary | ICD-10-CM | POA: Diagnosis not present

## 2014-06-20 DIAGNOSIS — K56609 Unspecified intestinal obstruction, unspecified as to partial versus complete obstruction: Secondary | ICD-10-CM

## 2014-06-20 DIAGNOSIS — K449 Diaphragmatic hernia without obstruction or gangrene: Secondary | ICD-10-CM | POA: Diagnosis present

## 2014-06-20 DIAGNOSIS — R739 Hyperglycemia, unspecified: Secondary | ICD-10-CM | POA: Diagnosis present

## 2014-06-20 DIAGNOSIS — E876 Hypokalemia: Secondary | ICD-10-CM | POA: Diagnosis present

## 2014-06-20 DIAGNOSIS — R112 Nausea with vomiting, unspecified: Secondary | ICD-10-CM | POA: Diagnosis present

## 2014-06-20 DIAGNOSIS — I10 Essential (primary) hypertension: Secondary | ICD-10-CM | POA: Diagnosis present

## 2014-06-20 DIAGNOSIS — K5669 Other intestinal obstruction: Secondary | ICD-10-CM

## 2014-06-20 DIAGNOSIS — R1012 Left upper quadrant pain: Secondary | ICD-10-CM | POA: Diagnosis not present

## 2014-06-20 DIAGNOSIS — K565 Intestinal adhesions [bands] with obstruction (postprocedural) (postinfection): Secondary | ICD-10-CM | POA: Diagnosis not present

## 2014-06-20 DIAGNOSIS — E785 Hyperlipidemia, unspecified: Secondary | ICD-10-CM | POA: Diagnosis present

## 2014-06-20 DIAGNOSIS — R1011 Right upper quadrant pain: Secondary | ICD-10-CM | POA: Diagnosis not present

## 2014-06-20 DIAGNOSIS — Z90710 Acquired absence of both cervix and uterus: Secondary | ICD-10-CM | POA: Diagnosis not present

## 2014-06-20 HISTORY — DX: Hyperlipidemia, unspecified: E78.5

## 2014-06-20 HISTORY — DX: Essential (primary) hypertension: I10

## 2014-06-20 LAB — URINE MICROSCOPIC-ADD ON

## 2014-06-20 LAB — COMPREHENSIVE METABOLIC PANEL
ALT: 50 U/L — AB (ref 0–35)
ANION GAP: 10 (ref 5–15)
AST: 41 U/L — ABNORMAL HIGH (ref 0–37)
Albumin: 4.2 g/dL (ref 3.5–5.2)
Alkaline Phosphatase: 68 U/L (ref 39–117)
BILIRUBIN TOTAL: 0.8 mg/dL (ref 0.3–1.2)
BUN: 10 mg/dL (ref 6–23)
CO2: 22 mmol/L (ref 19–32)
Calcium: 8.9 mg/dL (ref 8.4–10.5)
Chloride: 105 mEq/L (ref 96–112)
Creatinine, Ser: 0.66 mg/dL (ref 0.50–1.10)
GFR calc Af Amer: >90 mL/min (ref >90)
GFR calc non Af Amer: 89 mL/min — ABNORMAL LOW (ref >90)
GLUCOSE: 167 mg/dL — AB (ref 70–99)
POTASSIUM: 3.6 mmol/L (ref 3.5–5.1)
Sodium: 137 mmol/L (ref 135–145)
Total Protein: 7.2 g/dL (ref 6.0–8.3)

## 2014-06-20 LAB — CBC WITH DIFFERENTIAL/PLATELET
BASOS ABS: 0.1 10*3/uL (ref 0.0–0.1)
BASOS PCT: 0 % (ref 0–1)
Eosinophils Absolute: 0.1 10*3/uL (ref 0.0–0.7)
Eosinophils Relative: 1 % (ref 0–5)
HEMATOCRIT: 42.9 % (ref 36.0–46.0)
Hemoglobin: 14.5 g/dL (ref 12.0–15.0)
LYMPHS PCT: 15 % (ref 12–46)
Lymphs Abs: 2.3 10*3/uL (ref 0.7–4.0)
MCH: 31.5 pg (ref 26.0–34.0)
MCHC: 33.8 g/dL (ref 30.0–36.0)
MCV: 93.3 fL (ref 78.0–100.0)
MONOS PCT: 7 % (ref 3–12)
Monocytes Absolute: 1 10*3/uL (ref 0.1–1.0)
NEUTROS PCT: 77 % (ref 43–77)
Neutro Abs: 12 10*3/uL — ABNORMAL HIGH (ref 1.7–7.7)
Platelets: 280 10*3/uL (ref 150–400)
RBC: 4.6 MIL/uL (ref 3.87–5.11)
RDW: 13 % (ref 11.5–15.5)
WBC: 15.5 10*3/uL — ABNORMAL HIGH (ref 4.0–10.5)

## 2014-06-20 LAB — URINALYSIS, ROUTINE W REFLEX MICROSCOPIC
BILIRUBIN URINE: NEGATIVE
GLUCOSE, UA: NEGATIVE mg/dL
Hgb urine dipstick: NEGATIVE
KETONES UR: NEGATIVE mg/dL
LEUKOCYTES UA: NEGATIVE
Nitrite: POSITIVE — AB
PROTEIN: NEGATIVE mg/dL
Specific Gravity, Urine: <1.005 — ABNORMAL LOW (ref 1.005–1.030)
Urobilinogen, UA: 0.2 mg/dL (ref 0.0–1.0)
pH: 7.5 (ref 5.0–8.0)

## 2014-06-20 LAB — LIPASE, BLOOD: Lipase: 33 U/L (ref 11–59)

## 2014-06-20 LAB — I-STAT CG4 LACTIC ACID, ED: LACTIC ACID, VENOUS: 2.25 mmol/L — AB (ref 0.5–2.2)

## 2014-06-20 MED ORDER — MORPHINE SULFATE 4 MG/ML IJ SOLN
4.0000 mg | Freq: Once | INTRAMUSCULAR | Status: AC
  Administered 2014-06-20: 4 mg via INTRAVENOUS

## 2014-06-20 MED ORDER — IOHEXOL 300 MG/ML  SOLN
100.0000 mL | Freq: Once | INTRAMUSCULAR | Status: AC | PRN
  Administered 2014-06-20: 100 mL via INTRAVENOUS

## 2014-06-20 MED ORDER — SODIUM CHLORIDE 0.9 % IV SOLN: Freq: Once | INTRAVENOUS | Status: DC

## 2014-06-20 MED ORDER — SODIUM CHLORIDE 0.9 % IV BOLUS (SEPSIS)
1000.0000 mL | Freq: Once | INTRAVENOUS | Status: AC
  Administered 2014-06-20: 1000 mL via INTRAVENOUS

## 2014-06-20 MED ORDER — MORPHINE SULFATE 2 MG/ML IJ SOLN
2.0000 mg | INTRAMUSCULAR | Status: DC | PRN
  Administered 2014-06-20 – 2014-06-21 (×2): 2 mg via INTRAVENOUS
  Filled 2014-06-20: qty 1

## 2014-06-20 MED ORDER — ACETAMINOPHEN 325 MG PO TABS
650.0000 mg | ORAL_TABLET | Freq: Four times a day (QID) | ORAL | Status: DC | PRN
  Administered 2014-06-22: 650 mg via ORAL
  Filled 2014-06-20: qty 2

## 2014-06-20 MED ORDER — PANTOPRAZOLE SODIUM 40 MG IV SOLR
40.0000 mg | INTRAVENOUS | Status: DC
  Administered 2014-06-20 – 2014-06-22 (×3): 40 mg via INTRAVENOUS
  Filled 2014-06-20 (×3): qty 40

## 2014-06-20 MED ORDER — MORPHINE SULFATE 4 MG/ML IJ SOLN
4.0000 mg | Freq: Once | INTRAMUSCULAR | Status: AC
  Administered 2014-06-20: 4 mg via INTRAVENOUS
  Filled 2014-06-20: qty 1

## 2014-06-20 MED ORDER — MORPHINE SULFATE 2 MG/ML IJ SOLN
2.0000 mg | INTRAMUSCULAR | Status: DC | PRN
  Filled 2014-06-20: qty 1

## 2014-06-20 MED ORDER — MORPHINE SULFATE 4 MG/ML IJ SOLN
INTRAMUSCULAR | Status: AC
  Filled 2014-06-20: qty 1

## 2014-06-20 MED ORDER — SODIUM CHLORIDE 0.9 % IV SOLN
INTRAVENOUS | Status: DC
  Administered 2014-06-20: 10:00:00 via INTRAVENOUS

## 2014-06-20 MED ORDER — ONDANSETRON HCL 4 MG/2ML IJ SOLN
4.0000 mg | Freq: Once | INTRAMUSCULAR | Status: AC
  Administered 2014-06-20: 4 mg via INTRAVENOUS
  Filled 2014-06-20: qty 2

## 2014-06-20 MED ORDER — ACETAMINOPHEN 650 MG RE SUPP: 650.0000 mg | Freq: Four times a day (QID) | RECTAL | Status: DC | PRN

## 2014-06-20 MED ORDER — ONDANSETRON HCL 4 MG/2ML IJ SOLN: 4.0000 mg | Freq: Four times a day (QID) | INTRAMUSCULAR | Status: DC | PRN

## 2014-06-20 MED ORDER — SODIUM CHLORIDE 0.9 % IV SOLN: INTRAVENOUS | Status: AC

## 2014-06-20 MED ORDER — IOHEXOL 300 MG/ML  SOLN
50.0000 mL | Freq: Once | INTRAMUSCULAR | Status: AC | PRN
  Administered 2014-06-20: 50 mL via ORAL

## 2014-06-20 MED ORDER — PHENOL 1.4 % MT LIQD
1.0000 | OROMUCOSAL | Status: DC | PRN
  Administered 2014-06-20: 1 via OROMUCOSAL
  Filled 2014-06-20: qty 177

## 2014-06-20 MED ORDER — ONDANSETRON HCL 4 MG PO TABS: 4.0000 mg | ORAL_TABLET | Freq: Four times a day (QID) | ORAL | Status: DC | PRN

## 2014-06-20 NOTE — H&P (Signed)
[facility] History and Physical  ANNALYSE [name] XNA:[id number] DOB: [birth year] DOA: 06/20/2014  Referring physician:  PCP: Purvis Kilts, MD   Chief Complaint: Abdominal pain/nausea/vomiting  HPI: [name] [name] is a very pleasant 69 y.o. female with a past medical history that includes hypertension, hyperlipidemia, hiatal hernia, laparotomy and lysis of adhesions in 2014 presents the emergency department with chief complaint abdominal pain, distention, nausea and vomiting. Initial evaluation in the emergency department reveals recurrent small bowel obstruction.  Patient reports she was in her usual state of health 4 days ago she developed mild abdominal discomfort that quickly resolved on its own. Last night she suddenly developed worsening abdominal pain. Associated symptoms include abdominal distention nausea and vomiting of greenish bile emesis. She denies any coffee ground emesis. She does report having a bowel movement yesterday which she describes as normal in color and consistency. She denies fever chills or recent sick contacts. She denies any recent antibiotic use.  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 In the emergency department she is afebrile hemodynamically stable and not hypoxic. She is provided with an NG tube that is draining small amount of greenish drainage. She reports improvement in abdominal distention  Review of Systems:  10 point review of systems completed and all systems are negative except as indicated in the history of present illness  Past Medical History  Diagnosis Date  . H/O hiatal hernia   . Hypertension   . Hyperlipidemia    Past Surgical History  Procedure Laterality Date  . Appendectomy    . Abdominal hysterectomy    . Colonoscopy  03/18/2012    Procedure: COLONOSCOPY;   Surgeon: Rogene Houston, MD;  Location: [facility];  Service: Endoscopy;  Laterality: N/A;  930  . Laparotomy N/A 02/14/2013    Procedure: EXPLORATORY LAPAROTOMY;  Surgeon: Jamesetta So, MD;  Location: [facility];  Service: General;  Laterality: N/A;   Social History:  reports that she has never smoked. She does not have any smokeless tobacco history on file. She reports that she does not drink alcohol or use illicit drugs. She lives alone she is a retired Engineer, manufacturing systems. She is independent with ADLs No Known Allergies  No family history on file. family medical history is reviewed and is non-contributory to the admission of this elderly lady  Prior to Admission medications   Medication Sig Start Date End Date Taking? Authorizing Provider  amLODipine (NORVASC) 5 MG tablet Take 5 mg by mouth daily.   Yes Historical Provider, MD  aspirin EC 81 MG tablet Take 81 mg by mouth daily.   Yes Historical Provider, MD  atorvastatin (LIPITOR) 10 MG tablet Take 10 mg by mouth daily.   Yes Historical Provider, MD  beta carotene w/minerals (OCUVITE) tablet Take 1 tablet by mouth daily.   Yes Historical Provider, MD  Calcium Carbonate-Vitamin D (CALCIUM 600 + D PO) Take 1 tablet by mouth 2 (two) times daily.   Yes Historical Provider, MD  cholecalciferol (VITAMIN D) 1000 UNITS tablet Take 1,000 Units by mouth daily.   Yes Historical Provider, MD  docusate sodium 100 MG CAPS Take 100 mg by mouth daily. Patient taking differently: Take 100 mg by mouth 2 (two) times daily.  10/03/13  Yes Scherry Ran, MD  Fish Oil-Cholecalciferol (FISH OIL + D3) 1000-1000 MG-UNIT CAPS Take 1 capsule by  mouth 2 (two) times daily.   Yes Historical Provider, MD  Lactobacillus-Inulin ([place] PO) Take 1 capsule by mouth daily.    Yes Historical Provider, MD  losartan (COZAAR) 50 MG tablet Take 50 mg by mouth daily.   Yes Historical Provider, MD  Multiple Vitamin (MULTIVITAMIN WITH MINERALS) TABS Take 1 tablet  by mouth daily.   Yes Historical Provider, MD  omeprazole (PRILOSEC) 40 MG capsule Take 40 mg by mouth daily.   Yes Historical Provider, MD  Potassium 99 MG TABS Take 1 tablet by mouth daily.   Yes Historical Provider, MD  psyllium (METAMUCIL) 58.6 % packet Take 1 packet by mouth daily.   Yes Historical Provider, MD   Physical Exam: Filed Vitals:   06/20/14 0045 06/20/14 0530 06/20/14 0533 06/20/14 0600  BP: 137/94 108/81 108/81 121/78  Pulse: 86 91 88 88  Temp: 97.5 F (36.4 C)     TempSrc: Oral     Resp: 19  20   Height: 5\' 4"  (1.626 m)     Weight: 89.812 kg (198 lb)     SpO2: 94% 91% 93% 86%    Wt Readings from Last 3 Encounters:  06/20/14 89.812 kg (198 lb)  10/02/13 92.1 kg (203 lb 0.7 oz)  08/15/13 86.818 kg (191 lb 6.4 oz)    General:  Appears calm and comfortable Eyes: PERRL, normal lids, irises & conjunctiva ENT: grossly normal hearing, his membranes of her mouth are pink but slightly dry Neck: no LAD, masses or thyromegaly Cardiovascular: RRR, no m/r/g. No LE edema. PPP  Respiratory: CTA bilaterally, no w/r/r. Normal respiratory effort. Abdomen: soft, ntnd, mild tenderness particularly in the left lower quadrant, very sluggish bowel sounds NG intact draining small amount of greenish drainage Skin: no rash or induration seen on limited exam Musculoskeletal: grossly normal tone BUE/BLE Psychiatric: grossly normal mood and affect, speech fluent and appropriate Neurologic: grossly non-focal.          Labs on Admission:  Basic Metabolic Panel:  Recent Labs Lab 06/20/14 0100  NA 137  K 3.6  CL 105  CO2 22  GLUCOSE 167*  BUN 10  CREATININE 0.66  CALCIUM 8.9   Liver Function Tests:  Recent Labs Lab 06/20/14 0100  AST 41*  ALT 50*  ALKPHOS 68  BILITOT 0.8  PROT 7.2  ALBUMIN 4.2    Recent Labs Lab 06/20/14 0100  LIPASE 33   No results for input(s): AMMONIA in the last 168 hours. CBC:  Recent Labs Lab 06/20/14 0100  WBC 15.5*  NEUTROABS  12.0*  HGB 14.5  HCT 42.9  MCV 93.3  PLT 280   Cardiac Enzymes: No results for input(s): CKTOTAL, CKMB, CKMBINDEX, TROPONINI in the last 168 hours.  BNP (last 3 results) No results for input(s): PROBNP in the last 8760 hours. CBG: No results for input(s): GLUCAP in the last 168 hours.  Radiological Exams on Admission: Ct Abdomen Pelvis W Contrast  06/20/2014   CLINICAL DATA:  Unspecified abdominal pain  EXAM: CT ABDOMEN AND PELVIS WITH CONTRAST  TECHNIQUE: Multidetector CT imaging of the abdomen and pelvis was performed using the standard protocol following bolus administration of intravenous contrast.  CONTRAST:  67mL OMNIPAQUE IOHEXOL 300 MG/ML SOLN, 144mL OMNIPAQUE IOHEXOL 300 MG/ML SOLN  COMPARISON:  07/08/2013  FINDINGS: BODY WALL: Unremarkable.  LOWER CHEST: No acute findings. Small nodule along the lower left major fissure is stable from 2014.  ABDOMEN/PELVIS:  Liver: Probable diffuse fatty infiltration.  Biliary: No evidence of  biliary obstruction or stone.  Pancreas: Unremarkable.  Spleen: Unremarkable.  Adrenals: Unremarkable.  Kidneys and ureters: No hydronephrosis or stone.  Bladder: Unremarkable.  Reproductive: Hysterectomy with questionable right oophorectomy. Left ovary is unremarkable.  Bowel: Small bowel obstruction with dilated proximal and mid bowel containing fluid. There is fecalization of contents in the ileal segments with abrupt transition in the right lower quadrant on image 69. These findings have all been noted previously. There is relative decompression of the terminal ileum and colon. No evidence for bowel necrosis or perforation. 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  Retroperitoneum: Prominent small bowel mesenteric lymph nodes, stable or decreased from prior.  Peritoneum: No ascites or pneumoperitoneum.  Vascular: No acute abnormality.  OSSEOUS: No acute abnormalities.  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   Electronically Signed   By: Jorje Guild M.D.   On: 06/20/2014 04:21   Dg Abd Acute W/chest  06/20/2014   CLINICAL DATA:  Abdominal pain  EXAM: ACUTE ABDOMEN SERIES (ABDOMEN 2 VIEW & CHEST 1 VIEW)  COMPARISON:  10/02/2013  FINDINGS: There is no evidence of dilated bowel loops or free intraperitoneal air. No concerning intra-abdominal mass effect or calcification (calcification overlapping the lower pole right kidney is mesenteric on abdominal CT 07/08/2013). Heart size and mediastinal contours are within normal limits. Both lungs are clear.  IMPRESSION: Negative abdominal radiographs.  No acute cardiopulmonary disease.   Electronically Signed   By: Jorje Guild M.D.   On: 06/20/2014 02:48    EKG: Independently reviewed.   Assessment/Plan Principal Problem:   Small bowel obstruction: Recurrent likely related to past surgeries and/or stricture. Will admit and provide supportive therapy in the form of NG tube and nothing by mouth. Appreciate general surgery assistance. Pain medicine and antiemetic as needed Active Problems:    Nausea with vomiting: But improved with NG tube. Zofran as needed. Nothing by mouth    Abdominal pain: Related to #1. See above      Hypertension: Systolic blood pressure range 1 01/01/2020. She is on losartan and Norvasc at home. I would hold these for now. Will provide when necessary hydralazine. Will monitor closely    Hyperlipidemia: We'll hold her statin due to her nothing by mouth status. Obtain lipid panel    Hyperglycemia: Likely related to acute illness. No history diabetes. Will obtain a hemoglobin A1c monitor and cover with sliding scale as indicated.   Dr Arnoldo Morale  Code Status: full DVT Prophylaxis: Family Communication: none present Disposition Plan: home when ready likely 48 hours or so  Time spent: 64 minutes  [facility] Pager [phone]

## 2014-06-20 NOTE — ED Notes (Signed)
Pt reports history of bowel blockages in the past, states she is having the same symptoms, mid abd pain, bloating, vomiting.

## 2014-06-20 NOTE — Progress Notes (Signed)
UR chart review completed.  

## 2014-06-20 NOTE — Care Management Note (Signed)
    Page 1 of 1   06/23/2014     8:58:16 AM CARE MANAGEMENT NOTE 06/23/2014  Patient:  [name] [name], [name] [name]   Account Number:  [id number]  Date Initiated:  06/20/2014  Documentation initiated by:  Theophilus Kinds  Subjective/Objective Assessment:   Pt admitted from home with SBO. Pt lives alone and will return home at discharge. Pt is independent with ADL's.     Action/Plan:   Will continue to follow for discharge planning needs.   Anticipated DC Date:  06/23/2014   Anticipated DC Plan:  [place]  CM consult      Choice offered to / List presented to:             Status of service:  Completed, signed off Medicare Important Message given?  YES (If response is "NO", the following Medicare IM given date fields will be blank) Date Medicare IM given:  06/23/2014 Medicare IM given by:  Theophilus Kinds Date Additional Medicare IM given:   Additional Medicare IM given by:    Discharge Disposition:  HOME/SELF CARE  Per UR Regulation:    If discussed at Long Length of Stay Meetings, dates discussed:    Comments:  06/23/14 0855 Christinia Gully, RN BSN CM Pt discharged home today. No CM needs noted.  06/20/14 [place], RN BSN CM

## 2014-06-20 NOTE — ED Provider Notes (Signed)
CSN: [id number]     Arrival date & time 06/20/14  0029 History  This chart was scribed for Ezequiel Essex, MD by Molli Posey, ED Scribe. This patient was seen in room [facility]02/[facility] and the patient's care was started 12:48 AM.    Chief Complaint  Patient presents with  . Abdominal Pain   HPI HPI Comments: [name] [name] is a 69 y.o. female with a history of H/O hiatal hernia, HTN and hyperlipidemia who presents to the Emergency Department complaining of vomiting since 10PM last night. Pt reports associated waxing and waning mid abdominal pain with bloating. Pt reports a history of bowel blockages in the past and says she had to have surgery for her first bowel blockage in September 2014, which was her last surgery. Pt states her symptoms are similar to her past episodes of bowel obstructions. She reports her last BM was a few minutes ago and was normal. She reports no history of heart or lung problems. She denies back pain, CP and diarrhea. Pt reports NKDA.    Past Medical History  Diagnosis Date  . H/O hiatal hernia   . Hypertension   . Hyperlipidemia    Past Surgical History  Procedure Laterality Date  . Appendectomy    . Abdominal hysterectomy    . Colonoscopy  03/18/2012    Procedure: COLONOSCOPY;  Surgeon: Rogene Houston, MD;  Location: [facility];  Service: Endoscopy;  Laterality: N/A;  930  . Laparotomy N/A 02/14/2013    Procedure: EXPLORATORY LAPAROTOMY;  Surgeon: Jamesetta So, MD;  Location: [facility];  Service: General;  Laterality: N/A;   No family history on file. History  Substance Use Topics  . Smoking status: Never Smoker   . Smokeless tobacco: Not on file  . Alcohol Use: No   OB History    No data available     Review of Systems  A complete 10 system review of systems was obtained and all systems are negative except as noted in the HPI and PMH.    Allergies  Review of patient's allergies indicates no known allergies.  Home Medications   Prior to  Admission medications   Medication Sig Start Date End Date Taking? Authorizing Provider  amLODipine (NORVASC) 5 MG tablet Take 5 mg by mouth daily.   Yes Historical Provider, MD  aspirin EC 81 MG tablet Take 81 mg by mouth daily.   Yes Historical Provider, MD  atorvastatin (LIPITOR) 10 MG tablet Take 10 mg by mouth daily.   Yes Historical Provider, MD  docusate sodium 100 MG CAPS Take 100 mg by mouth daily. 10/03/13  Yes Scherry Ran, MD  beta carotene w/minerals (OCUVITE) tablet Take 1 tablet by mouth daily.    Historical Provider, MD  Calcium Carbonate-Vitamin D (CALCIUM 600 + D PO) Take 1 tablet by mouth 2 (two) times daily.    Historical Provider, MD  cholecalciferol (VITAMIN D) 1000 UNITS tablet Take 1,000 Units by mouth daily.    Historical Provider, MD  Lactobacillus-Inulin ([place] PO) Take 1 capsule by mouth daily.     Historical Provider, MD  losartan (COZAAR) 50 MG tablet Take 50 mg by mouth daily.    Historical Provider, MD  Multiple Vitamin (MULTIVITAMIN WITH MINERALS) TABS Take 1 tablet by mouth daily.    Historical Provider, MD  omeprazole (PRILOSEC) 40 MG capsule Take 40 mg by mouth daily.    Historical Provider, MD  Potassium 99 MG TABS Take 1 tablet by mouth  daily.    Historical Provider, MD  psyllium (METAMUCIL) 58.6 % packet Take 1 packet by mouth daily.    Historical Provider, MD   BP 121/78 mmHg  Pulse 88  Temp(Src) 97.5 F (36.4 C) (Oral)  Resp 20  Ht 5\' 4"  (1.626 m)  Wt 198 lb (89.812 kg)  BMI 33.97 kg/m2  SpO2 86% Physical Exam  Constitutional: She is oriented to person, place, and time. She appears well-developed and well-nourished. No distress.  Actively vomiting.   HENT:  Head: Normocephalic and atraumatic.  Mouth/Throat: Oropharynx is clear and moist. No oropharyngeal exudate.  Eyes: Conjunctivae and EOM are normal. Pupils are equal, round, and reactive to light.  Neck: Normal range of motion. Neck supple.  No meningismus.   Cardiovascular: Normal rate, regular rhythm, normal heart sounds and intact distal pulses.   No murmur heard. Pulmonary/Chest: Effort normal and breath sounds normal. No respiratory distress.  Abdominal: Soft. There is tenderness. There is no rebound and no guarding.  Tenderness to RUQ and LUQ. No CVA tenderness.   Musculoskeletal: Normal range of motion. She exhibits no edema or tenderness.  Neurological: She is alert and oriented to person, place, and time. No cranial nerve deficit. She exhibits normal muscle tone. Coordination normal.  No ataxia on finger to nose bilaterally. No pronator drift. 5/5 strength throughout. CN 2-12 intact. Negative Romberg. Equal grip strength. Sensation intact. Gait is normal.   Skin: Skin is warm.  Psychiatric: She has a normal mood and affect. Her behavior is normal.  Nursing note and vitals reviewed.   ED Course  Procedures   DIAGNOSTIC STUDIES: Oxygen Saturation is 94% on RA, normal by my interpretation.    COORDINATION OF CARE: 12:56 AM Discussed treatment plan with pt at bedside and pt agreed to plan.   Labs Review Labs Reviewed  CBC WITH DIFFERENTIAL - Abnormal; Notable for the following:    WBC 15.5 (*)    Neutro Abs 12.0 (*)    All other components within normal limits  COMPREHENSIVE METABOLIC PANEL - Abnormal; Notable for the following:    Glucose, Bld 167 (*)    AST 41 (*)    ALT 50 (*)    GFR calc non Af Amer 89 (*)    All other components within normal limits  URINALYSIS, ROUTINE W REFLEX MICROSCOPIC - Abnormal; Notable for the following:    Specific Gravity, Urine <1.005 (*)    Nitrite POSITIVE (*)    All other components within normal limits  URINE MICROSCOPIC-ADD ON - Abnormal; Notable for the following:    Bacteria, UA MANY (*)    All other components within normal limits  I-STAT CG4 LACTIC ACID, ED - Abnormal; Notable for the following:    Lactic Acid, Venous 2.25 (*)    All other components within normal limits  LIPASE,  BLOOD    Imaging Review Ct Abdomen Pelvis W Contrast  06/20/2014   CLINICAL DATA:  Unspecified abdominal pain  EXAM: CT ABDOMEN AND PELVIS WITH CONTRAST  TECHNIQUE: Multidetector CT imaging of the abdomen and pelvis was performed using the standard protocol following bolus administration of intravenous contrast.  CONTRAST:  50mL OMNIPAQUE IOHEXOL 300 MG/ML SOLN, 174mL OMNIPAQUE IOHEXOL 300 MG/ML SOLN  COMPARISON:  07/08/2013  FINDINGS: BODY WALL: Unremarkable.  LOWER CHEST: No acute findings. Small nodule along the lower left major fissure is stable from 2014.  ABDOMEN/PELVIS:  Liver: Probable diffuse fatty infiltration.  Biliary: No evidence of biliary obstruction or stone.  Pancreas: Unremarkable.  Spleen: Unremarkable.  Adrenals: Unremarkable.  Kidneys and ureters: No hydronephrosis or stone.  Bladder: Unremarkable.  Reproductive: Hysterectomy with questionable right oophorectomy. Left ovary is unremarkable.  Bowel: Small bowel obstruction with dilated proximal and mid bowel containing fluid. There is fecalization of contents in the ileal segments with abrupt transition in the right lower quadrant on image 69. These findings have all been noted previously. There is relative decompression of the terminal ileum and colon. No evidence for bowel necrosis or perforation. 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  Retroperitoneum: Prominent small bowel mesenteric lymph nodes, stable or decreased from prior.  Peritoneum: No ascites or pneumoperitoneum.  Vascular: No acute abnormality.  OSSEOUS: No acute abnormalities.  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   Electronically Signed   By: Jorje Guild M.D.   On: 06/20/2014 04:21   Dg Abd Acute W/chest  06/20/2014   CLINICAL DATA:  Abdominal pain  EXAM: ACUTE ABDOMEN SERIES (ABDOMEN 2 VIEW & CHEST 1 VIEW)  COMPARISON:  10/02/2013  FINDINGS: There is no evidence of dilated bowel loops or free  intraperitoneal air. No concerning intra-abdominal mass effect or calcification (calcification overlapping the lower pole right kidney is mesenteric on abdominal CT 07/08/2013). Heart size and mediastinal contours are within normal limits. Both lungs are clear.  IMPRESSION: Negative abdominal radiographs.  No acute cardiopulmonary disease.   Electronically Signed   By: Jorje Guild M.D.   On: 06/20/2014 02:48     EKG Interpretation None      MDM   Final diagnoses:  Abdominal pain  Small bowel obstruction   abdominal pain with nausea and vomiting similar to previous bowel obstructions. Normal bowel movement this evening.  White count 15. Lactate 2.2. Abdominal x-ray shows no bowel obstruction. We'll proceed with CT scan.  CT scan shows recurrent small bowel obstruction with transition point in the right lower quadrant. NG tube placed. Additional IV fluids given.  Discussed with Dr. Arnoldo Morale who knows patient. He requests hospitalist admission. D/w Dr. Darrick Meigs.     Ezequiel Essex, MD 06/20/14 [phone]

## 2014-06-20 NOTE — Consult Note (Signed)
Reason for Consult: Small bowel obstruction Referring Physician: Hospitalist  [name] [name] is an 69 y.o. female.  HPI: Patient is a 69 year old white female status post exploratory laparotomy and lysis of adhesions in 2014 who presents with abdominal distention and nausea. She was last admitted with a partial small bowel obstruction in May 2015. This resolved with NG tube decompression and bowel rest. A CT scan was performed which revealed the recurrent small bowel obstruction. There is no evidence of mesenteric edema. No significant ascites is noted. The patient states she had a bowel movement yesterday evening which was normal. An NG tube has been placed and the patient was admitted to the hospital for further evaluation treatment. She denies any abdominal pain at this time.  Past Medical History  Diagnosis Date  . H/O hiatal hernia   . Hypertension   . Hyperlipidemia     Past Surgical History  Procedure Laterality Date  . Appendectomy    . Abdominal hysterectomy    . Colonoscopy  03/18/2012    Procedure: COLONOSCOPY;  Surgeon: Rogene Houston, MD;  Location: [facility];  Service: Endoscopy;  Laterality: N/A;  930  . Laparotomy N/A 02/14/2013    Procedure: EXPLORATORY LAPAROTOMY;  Surgeon: Jamesetta So, MD;  Location: [facility];  Service: General;  Laterality: N/A;    No family history on file.  Social History:  reports that she has never smoked. She does not have any smokeless tobacco history on file. She reports that she does not drink alcohol or use illicit drugs.  Allergies: No Known Allergies  Medications: I have reviewed the patient's current medications.  Results for orders placed or performed during the hospital encounter of 06/20/14 (from the past 48 hour(s))  CBC with Differential     Status: Abnormal   Collection Time: 06/20/14  1:00 AM  Result Value Ref Range   WBC 15.5 (H) 4.0 - 10.5 K/uL   RBC 4.60 3.87 - 5.11 MIL/uL   Hemoglobin 14.5 12.0 - 15.0 g/dL    HCT 42.9 36.0 - 46.0 %   MCV 93.3 78.0 - 100.0 fL   MCH 31.5 26.0 - 34.0 pg   MCHC 33.8 30.0 - 36.0 g/dL   RDW 13.0 11.5 - 15.5 %   Platelets 280 150 - 400 K/uL   Neutrophils Relative % 77 43 - 77 %   Neutro Abs 12.0 (H) 1.7 - 7.7 K/uL   Lymphocytes Relative 15 12 - 46 %   Lymphs Abs 2.3 0.7 - 4.0 K/uL   Monocytes Relative 7 3 - 12 %   Monocytes Absolute 1.0 0.1 - 1.0 K/uL   Eosinophils Relative 1 0 - 5 %   Eosinophils Absolute 0.1 0.0 - 0.7 K/uL   Basophils Relative 0 0 - 1 %   Basophils Absolute 0.1 0.0 - 0.1 K/uL  Comprehensive metabolic panel     Status: Abnormal   Collection Time: 06/20/14  1:00 AM  Result Value Ref Range   Sodium 137 135 - 145 mmol/L    Comment: Please note change in reference range.   Potassium 3.6 3.5 - 5.1 mmol/L    Comment: Please note change in reference range.   Chloride 105 96 - 112 mEq/L   CO2 22 19 - 32 mmol/L   Glucose, Bld 167 (H) 70 - 99 mg/dL   BUN 10 6 - 23 mg/dL   Creatinine, Ser 0.66 0.50 - 1.10 mg/dL   Calcium 8.9 8.4 - 10.5 mg/dL   Total  Protein 7.2 6.0 - 8.3 g/dL   Albumin 4.2 3.5 - 5.2 g/dL   AST 41 (H) 0 - 37 U/L   ALT 50 (H) 0 - 35 U/L   Alkaline Phosphatase 68 39 - 117 U/L   Total Bilirubin 0.8 0.3 - 1.2 mg/dL   GFR calc non Af Amer 89 (L) >90 mL/min   GFR calc Af Amer >90 >90 mL/min    Comment: (NOTE) The eGFR has been calculated using the CKD EPI equation. This calculation has not been validated in all clinical situations. eGFR's persistently <90 mL/min signify possible Chronic Kidney Disease.    Anion gap 10 5 - 15  Lipase, blood     Status: None   Collection Time: 06/20/14  1:00 AM  Result Value Ref Range   Lipase 33 11 - 59 U/L  I-Stat CG4 Lactic Acid, ED     Status: Abnormal   Collection Time: 06/20/14  1:10 AM  Result Value Ref Range   Lactic Acid, Venous 2.25 (H) 0.5 - 2.2 mmol/L  Urinalysis, Routine w reflex microscopic     Status: Abnormal   Collection Time: 06/20/14  5:20 AM  Result Value Ref Range    Color, Urine YELLOW YELLOW   APPearance CLEAR CLEAR   Specific Gravity, Urine <1.005 (L) 1.005 - 1.030   pH 7.5 5.0 - 8.0   Glucose, UA NEGATIVE NEGATIVE mg/dL   Hgb urine dipstick NEGATIVE NEGATIVE   Bilirubin Urine NEGATIVE NEGATIVE   Ketones, ur NEGATIVE NEGATIVE mg/dL   Protein, ur NEGATIVE NEGATIVE mg/dL   Urobilinogen, UA 0.2 0.0 - 1.0 mg/dL   Nitrite POSITIVE (A) NEGATIVE   Leukocytes, UA NEGATIVE NEGATIVE  Urine microscopic-add on     Status: Abnormal   Collection Time: 06/20/14  5:20 AM  Result Value Ref Range   Squamous Epithelial / LPF RARE RARE   WBC, UA 0-2 <3 WBC/hpf   RBC / HPF 0-2 <3 RBC/hpf   Bacteria, UA MANY (A) RARE    Ct Abdomen Pelvis W Contrast  06/20/2014   CLINICAL DATA:  Unspecified abdominal pain  EXAM: CT ABDOMEN AND PELVIS WITH CONTRAST  TECHNIQUE: Multidetector CT imaging of the abdomen and pelvis was performed using the standard protocol following bolus administration of intravenous contrast.  CONTRAST:  30m OMNIPAQUE IOHEXOL 300 MG/ML SOLN, 1051mOMNIPAQUE IOHEXOL 300 MG/ML SOLN  COMPARISON:  07/08/2013  FINDINGS: BODY WALL: Unremarkable.  LOWER CHEST: No acute findings. Small nodule along the lower left major fissure is stable from 2014.  ABDOMEN/PELVIS:  Liver: Probable diffuse fatty infiltration.  Biliary: No evidence of biliary obstruction or stone.  Pancreas: Unremarkable.  Spleen: Unremarkable.  Adrenals: Unremarkable.  Kidneys and ureters: No hydronephrosis or stone.  Bladder: Unremarkable.  Reproductive: Hysterectomy with questionable right oophorectomy. Left ovary is unremarkable.  Bowel: Small bowel obstruction with dilated proximal and mid bowel containing fluid. There is fecalization of contents in the ileal segments with abrupt transition in the right lower quadrant on image 69. These findings have all been noted previously. There is relative decompression of the terminal ileum and colon. No evidence for bowel necrosis or perforation. 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  Retroperitoneum: Prominent small bowel mesenteric lymph nodes, stable or decreased from prior.  Peritoneum: No ascites or pneumoperitoneum.  Vascular: No acute abnormality.  OSSEOUS: No acute abnormalities.  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   Electronically Signed   By: JoJorje Guild.D.   On: 06/20/2014  04:21   Dg Abd Acute W/chest  06/20/2014   CLINICAL DATA:  Abdominal pain  EXAM: ACUTE ABDOMEN SERIES (ABDOMEN 2 VIEW & CHEST 1 VIEW)  COMPARISON:  10/02/2013  FINDINGS: There is no evidence of dilated bowel loops or free intraperitoneal air. No concerning intra-abdominal mass effect or calcification (calcification overlapping the lower pole right kidney is mesenteric on abdominal CT 07/08/2013). Heart size and mediastinal contours are within normal limits. Both lungs are clear.  IMPRESSION: Negative abdominal radiographs.  No acute cardiopulmonary disease.   Electronically Signed   By: Jorje Guild M.D.   On: 06/20/2014 02:48    ROS: See chart Blood pressure 121/78, pulse 88, temperature 97.5 F (36.4 C), temperature source Oral, resp. rate 20, height _0  (1.626 m), weight 89.812 kg (198 lb), SpO2 86 %. Physical Exam: Pleasant white female in no acute distress. Abdomen is soft with minimal bowel sounds appreciated. No distention noted. No rigidity noted. No tenderness noted.  Assessment/Plan: Impression: Recurrent small bowel obstruction, partial. No need for acute surgical intervention at this time. Plan: Agree with NG tube decompression. We'll follow with you. Repeat CBC in a.m.  Kregg Cihlar A 06/20/2014, 8:02 AM

## 2014-06-21 DIAGNOSIS — E876 Hypokalemia: Secondary | ICD-10-CM

## 2014-06-21 LAB — BASIC METABOLIC PANEL
Anion gap: 5 (ref 5–15)
BUN: 8 mg/dL (ref 6–23)
CALCIUM: 7.9 mg/dL — AB (ref 8.4–10.5)
CO2: 27 mmol/L (ref 19–32)
CREATININE: 0.6 mg/dL (ref 0.50–1.10)
Chloride: 108 mEq/L (ref 96–112)
GFR calc Af Amer: >90 mL/min (ref >90)
GFR calc non Af Amer: >90 mL/min (ref >90)
Glucose, Bld: 94 mg/dL (ref 70–99)
Potassium: 2.9 mmol/L — ABNORMAL LOW (ref 3.5–5.1)
Sodium: 140 mmol/L (ref 135–145)

## 2014-06-21 LAB — CBC
HEMATOCRIT: 36.4 % (ref 36.0–46.0)
Hemoglobin: 12 g/dL (ref 12.0–15.0)
MCH: 31.4 pg (ref 26.0–34.0)
MCHC: 33 g/dL (ref 30.0–36.0)
MCV: 95.3 fL (ref 78.0–100.0)
Platelets: 231 10*3/uL (ref 150–400)
RBC: 3.82 MIL/uL — ABNORMAL LOW (ref 3.87–5.11)
RDW: 13.3 % (ref 11.5–15.5)
WBC: 13.9 10*3/uL — ABNORMAL HIGH (ref 4.0–10.5)

## 2014-06-21 LAB — MAGNESIUM: MAGNESIUM: 1.9 mg/dL (ref 1.5–2.5)

## 2014-06-21 LAB — PHOSPHORUS: PHOSPHORUS: 2 mg/dL — AB (ref 2.3–4.6)

## 2014-06-21 MED ORDER — HYDRALAZINE HCL 20 MG/ML IJ SOLN
5.0000 mg | Freq: Four times a day (QID) | INTRAMUSCULAR | Status: DC
  Administered 2014-06-21 – 2014-06-22 (×4): 5 mg via INTRAVENOUS
  Filled 2014-06-21 (×4): qty 1

## 2014-06-21 MED ORDER — POTASSIUM CHLORIDE 10 MEQ/100ML IV SOLN
10.0000 meq | INTRAVENOUS | Status: DC
  Administered 2014-06-21 (×2): 10 meq via INTRAVENOUS
  Filled 2014-06-21 (×4): qty 100

## 2014-06-21 MED ORDER — POTASSIUM PHOSPHATES 15 MMOLE/5ML IV SOLN
20.0000 mmol | Freq: Once | INTRAVENOUS | Status: DC
  Filled 2014-06-21: qty 6.67

## 2014-06-21 MED ORDER — DEXTROSE-NACL 5-0.45 % IV SOLN
INTRAVENOUS | Status: DC
  Administered 2014-06-21 – 2014-06-22 (×2): via INTRAVENOUS

## 2014-06-21 MED ORDER — POTASSIUM CHLORIDE 10 MEQ/100ML IV SOLN
10.0000 meq | Freq: Once | INTRAVENOUS | Status: AC
  Administered 2014-06-21: 10 meq via INTRAVENOUS

## 2014-06-21 MED ORDER — KCL IN DEXTROSE-NACL 40-5-0.45 MEQ/L-%-% IV SOLN
INTRAVENOUS | Status: DC
  Administered 2014-06-21: 10:00:00 via INTRAVENOUS

## 2014-06-21 MED ORDER — BISACODYL 10 MG RE SUPP
10.0000 mg | Freq: Once | RECTAL | Status: AC
  Administered 2014-06-21: 10 mg via RECTAL
  Filled 2014-06-21: qty 1

## 2014-06-21 MED ORDER — POTASSIUM CHLORIDE 10 MEQ/100ML IV SOLN: 10.0000 meq | INTRAVENOUS | Status: DC

## 2014-06-21 MED ORDER — DEXTROSE 5 % IV SOLN
20.0000 mmol | Freq: Once | INTRAVENOUS | Status: DC
  Filled 2014-06-21: qty 6.67

## 2014-06-21 NOTE — Progress Notes (Signed)
Lindsey.Lema MD notified of patient's K+ level 2.9, Ca+ level 7.9 this AM.

## 2014-06-21 NOTE — Progress Notes (Signed)
Subjective: Patient is passing gas. Some crampy abdominal pain. Does feel better. No bowel movement yet.  Objective: Vital signs in last 24 hours: Temp:  [98.1 F (36.7 C)-98.6 F (37 C)] 98.2 F (36.8 C) (01/20 0500) Pulse Rate:  [65-75] 65 (01/20 0500) Resp:  [20] 20 (01/20 0500) BP: (118-159)/(61-75) 159/66 mmHg (01/20 0500) SpO2:  [94 %-96 %] 96 % (01/20 0500) Last BM Date: 06/19/14  Intake/Output from previous day: 01/19 0701 - 01/20 0700 In: 2186.7 [I.V.:1986.7; NG/GT:200] Out: 1250 [Urine:1050; Emesis/NG output:200] Intake/Output this shift: Total I/O In: -  Out: 400 [Urine:400]  General appearance: alert, cooperative and no distress GI: soft, non-tender; bowel sounds normal; no masses,  no organomegaly  Lab Results:   Recent Labs  06/20/14 0100 06/21/14 0543  WBC 15.5* 13.9*  HGB 14.5 12.0  HCT 42.9 36.4  PLT 280 231   BMET  Recent Labs  06/20/14 0100 06/21/14 0543  NA 137 140  K 3.6 2.9*  CL 105 108  CO2 22 27  GLUCOSE 167* 94  BUN 10 8  CREATININE 0.66 0.60  CALCIUM 8.9 7.9*   PT/INR No results for input(s): LABPROT, INR in the last 72 hours.  Studies/Results: Ct Abdomen Pelvis W Contrast  06/20/2014   CLINICAL DATA:  Unspecified abdominal pain  EXAM: CT ABDOMEN AND PELVIS WITH CONTRAST  TECHNIQUE: Multidetector CT imaging of the abdomen and pelvis was performed using the standard protocol following bolus administration of intravenous contrast.  CONTRAST:  46mL OMNIPAQUE IOHEXOL 300 MG/ML SOLN, 179mL OMNIPAQUE IOHEXOL 300 MG/ML SOLN  COMPARISON:  07/08/2013  FINDINGS: BODY WALL: Unremarkable.  LOWER CHEST: No acute findings. Small nodule along the lower left major fissure is stable from 2014.  ABDOMEN/PELVIS:  Liver: Probable diffuse fatty infiltration.  Biliary: No evidence of biliary obstruction or stone.  Pancreas: Unremarkable.  Spleen: Unremarkable.  Adrenals: Unremarkable.  Kidneys and ureters: No hydronephrosis or stone.  Bladder:  Unremarkable.  Reproductive: Hysterectomy with questionable right oophorectomy. Left ovary is unremarkable.  Bowel: Small bowel obstruction with dilated proximal and mid bowel containing fluid. There is fecalization of contents in the ileal segments with abrupt transition in the right lower quadrant on image 69. These findings have all been noted previously. There is relative decompression of the terminal ileum and colon. No evidence for bowel necrosis or perforation. 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  Retroperitoneum: Prominent small bowel mesenteric lymph nodes, stable or decreased from prior.  Peritoneum: No ascites or pneumoperitoneum.  Vascular: No acute abnormality.  OSSEOUS: No acute abnormalities.  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   Electronically Signed   By: Jorje Guild M.D.   On: 06/20/2014 04:21   Dg Abd Acute W/chest  06/20/2014   CLINICAL DATA:  Abdominal pain  EXAM: ACUTE ABDOMEN SERIES (ABDOMEN 2 VIEW & CHEST 1 VIEW)  COMPARISON:  10/02/2013  FINDINGS: There is no evidence of dilated bowel loops or free intraperitoneal air. No concerning intra-abdominal mass effect or calcification (calcification overlapping the lower pole right kidney is mesenteric on abdominal CT 07/08/2013). Heart size and mediastinal contours are within normal limits. Both lungs are clear.  IMPRESSION: Negative abdominal radiographs.  No acute cardiopulmonary disease.   Electronically Signed   By: Jorje Guild M.D.   On: 06/20/2014 02:48    Anti-infectives: Anti-infectives    None      Assessment/Plan: Impression: Small bowel obstruction, resolving, hypokalemia Plan: May remove NG tube once patient has bowel movement. She  can then be started on clear liquid diet. Will supplement potassium.  LOS: 1 day    Blayden Conwell A 06/21/2014

## 2014-06-21 NOTE — Progress Notes (Signed)
0953 Patient ambulated x 4 laps around unit w/o difficulty.

## 2014-06-21 NOTE — Progress Notes (Signed)
[facility] PROGRESS NOTE  [name] [name] GYF:[id number] DOB: 27-Jun-1945 DOA: 06/20/2014 PCP: Purvis Kilts, MD  Assessment/Plan: Small bowel obstruction: Recurrent likely related to past surgeries and/or stricture. Improving with NG tube. General surgery following. Will plan to discontinue NG and start clear liquids after BM per surgery. Reports pain managed. Reports passing flatus Active Problems:     Hypertension: Controlled. She is on losartan and Norvasc at home. I would hold these for now. Will provide when necessary hydralazine. Will monitor closely   Hypokalemia: likely related to GI losses. Will replete and recheck.    Hyperglycemia: resolved this am.    Code Status: full Family Communication: none present Disposition Plan: home hopefully 24-48 hours   Consultants:  General surgery  Procedures:  none  Antibiotics:  none  HPI/Subjective: Reports feeling better with good pain management. Ambulating in hall with steady gait  Objective: Filed Vitals:   06/21/14 0500  BP: 159/66  Pulse: 65  Temp: 98.2 F (36.8 C)  Resp: 20    Intake/Output Summary (Last 24 hours) at 06/21/14 0950 Last data filed at 06/21/14 9163  Gross per 24 hour  Intake 2186.67 ml  Output   1950 ml  Net 236.67 ml   Filed Weights   06/20/14 0045  Weight: 89.812 kg (198 lb)    Exam:   General:  Well nourished. Appears comfortable  Cardiovascular: RRR no MGR No LE edema  Respiratory: normal effort BS clear bilaterally no wheeze  Abdomen: non-distended soft +BS mild tenderness to palpation particularly lower quadrants. NG intact draining yellowish drainage  Musculoskeletal: joints without swelling/erythema  Data Reviewed: Basic Metabolic Panel:  Recent Labs Lab 06/20/14 0100 06/21/14 0543  NA 137 140  K 3.6 2.9*  CL 105 108  CO2 22 27  GLUCOSE 167* 94  BUN 10 8  CREATININE 0.66 0.60  CALCIUM 8.9 7.9*  MG  --  1.9  PHOS  --  2.0*   Liver  Function Tests:  Recent Labs Lab 06/20/14 0100  AST 41*  ALT 50*  ALKPHOS 68  BILITOT 0.8  PROT 7.2  ALBUMIN 4.2    Recent Labs Lab 06/20/14 0100  LIPASE 33   No results for input(s): AMMONIA in the last 168 hours. CBC:  Recent Labs Lab 06/20/14 0100 06/21/14 0543  WBC 15.5* 13.9*  NEUTROABS 12.0*  --   HGB 14.5 12.0  HCT 42.9 36.4  MCV 93.3 95.3  PLT 280 231   Cardiac Enzymes: No results for input(s): CKTOTAL, CKMB, CKMBINDEX, TROPONINI in the last 168 hours. BNP (last 3 results) No results for input(s): PROBNP in the last 8760 hours. CBG: No results for input(s): GLUCAP in the last 168 hours.  No results found for this or any previous visit (from the past 240 hour(s)).   Studies: Ct Abdomen Pelvis W Contrast  06/20/2014   CLINICAL DATA:  Unspecified abdominal pain  EXAM: CT ABDOMEN AND PELVIS WITH CONTRAST  TECHNIQUE: Multidetector CT imaging of the abdomen and pelvis was performed using the standard protocol following bolus administration of intravenous contrast.  CONTRAST:  5mL OMNIPAQUE IOHEXOL 300 MG/ML SOLN, 130mL OMNIPAQUE IOHEXOL 300 MG/ML SOLN  COMPARISON:  07/08/2013  FINDINGS: BODY WALL: Unremarkable.  LOWER CHEST: No acute findings. Small nodule along the lower left major fissure is stable from 2014.  ABDOMEN/PELVIS:  Liver: Probable diffuse fatty infiltration.  Biliary: No evidence of biliary obstruction or stone.  Pancreas: Unremarkable.  Spleen: Unremarkable.  Adrenals: Unremarkable.  Kidneys and ureters: No hydronephrosis  or stone.  Bladder: Unremarkable.  Reproductive: Hysterectomy with questionable right oophorectomy. Left ovary is unremarkable.  Bowel: Small bowel obstruction with dilated proximal and mid bowel containing fluid. There is fecalization of contents in the ileal segments with abrupt transition in the right lower quadrant on image 69. These findings have all been noted previously. There is relative decompression of the terminal ileum and  colon. No evidence for bowel necrosis or perforation. 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  Retroperitoneum: Prominent small bowel mesenteric lymph nodes, stable or decreased from prior.  Peritoneum: No ascites or pneumoperitoneum.  Vascular: No acute abnormality.  OSSEOUS: No acute abnormalities.  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   Electronically Signed   By: Jorje Guild M.D.   On: 06/20/2014 04:21   Dg Abd Acute W/chest  06/20/2014   CLINICAL DATA:  Abdominal pain  EXAM: ACUTE ABDOMEN SERIES (ABDOMEN 2 VIEW & CHEST 1 VIEW)  COMPARISON:  10/02/2013  FINDINGS: There is no evidence of dilated bowel loops or free intraperitoneal air. No concerning intra-abdominal mass effect or calcification (calcification overlapping the lower pole right kidney is mesenteric on abdominal CT 07/08/2013). Heart size and mediastinal contours are within normal limits. Both lungs are clear.  IMPRESSION: Negative abdominal radiographs.  No acute cardiopulmonary disease.   Electronically Signed   By: Jorje Guild M.D.   On: 06/20/2014 02:48    Scheduled Meds: . pantoprazole (PROTONIX) IV  40 mg Intravenous Q24H  . potassium chloride  10 mEq Intravenous Q1 Hr x 4   Continuous Infusions: . dextrose 5 % and 0.45 % NaCl with KCl 40 mEq/L 75 mL/hr at 06/21/14 4650    Principal Problem:   Small bowel obstruction Active Problems:   Nausea with vomiting   Abdominal pain   SBO (small bowel obstruction)   Hypertension   Hyperlipidemia   Hyperglycemia   AP (abdominal pain)   Hypokalemia    Time spent: 35 minutes    [facility] Pager [phone]. If 7PM-7AM, please contact night-coverage at [email or web address], password [facility] 06/21/2014, 9:50 AM  LOS: 1 day

## 2014-06-21 NOTE — Progress Notes (Signed)
Per Dr.Jenkins if/when patient has BM, NG tube can be removed.

## 2014-06-22 LAB — BASIC METABOLIC PANEL
Anion gap: 8 (ref 5–15)
Anion gap: 9 (ref 5–15)
BUN: 6 mg/dL (ref 6–23)
CO2: 25 mmol/L (ref 19–32)
CO2: 24 mmol/L (ref 19–32)
Calcium: 8.6 mg/dL (ref 8.4–10.5)
Calcium: 9 mg/dL (ref 8.4–10.5)
Chloride: 105 mEq/L (ref 96–112)
Chloride: 107 mEq/L (ref 96–112)
Creatinine, Ser: 0.56 mg/dL (ref 0.50–1.10)
Creatinine, Ser: 0.66 mg/dL (ref 0.50–1.10)
GFR calc Af Amer: >90 mL/min (ref >90)
GFR calc Af Amer: >90 mL/min (ref >90)
GFR calc non Af Amer: >90 mL/min (ref >90)
GFR calc non Af Amer: 89 mL/min — ABNORMAL LOW (ref >90)
GLUCOSE: 93 mg/dL (ref 70–99)
Glucose, Bld: 113 mg/dL — ABNORMAL HIGH (ref 70–99)
POTASSIUM: 2.8 mmol/L — AB (ref 3.5–5.1)
Potassium: 2.9 mmol/L — ABNORMAL LOW (ref 3.5–5.1)
SODIUM: 138 mmol/L (ref 135–145)
SODIUM: 140 mmol/L (ref 135–145)

## 2014-06-22 LAB — MAGNESIUM: Magnesium: 2 mg/dL (ref 1.5–2.5)

## 2014-06-22 LAB — PHOSPHORUS: Phosphorus: 2.9 mg/dL (ref 2.3–4.6)

## 2014-06-22 MED ORDER — HYDRALAZINE HCL 20 MG/ML IJ SOLN
5.0000 mg | Freq: Once | INTRAMUSCULAR | Status: AC
  Administered 2014-06-22: 5 mg via INTRAVENOUS
  Filled 2014-06-22: qty 1

## 2014-06-22 MED ORDER — LOSARTAN POTASSIUM 50 MG PO TABS
50.0000 mg | ORAL_TABLET | Freq: Every day | ORAL | Status: DC
  Administered 2014-06-22 – 2014-06-23 (×2): 50 mg via ORAL
  Filled 2014-06-22 (×2): qty 1

## 2014-06-22 MED ORDER — PANTOPRAZOLE SODIUM 40 MG PO TBEC
40.0000 mg | DELAYED_RELEASE_TABLET | Freq: Every day | ORAL | Status: DC
  Administered 2014-06-23: 40 mg via ORAL
  Filled 2014-06-22: qty 1

## 2014-06-22 MED ORDER — POTASSIUM CHLORIDE 10 MEQ/100ML IV SOLN
10.0000 meq | INTRAVENOUS | Status: AC
  Administered 2014-06-22 (×4): 10 meq via INTRAVENOUS
  Filled 2014-06-22 (×3): qty 100

## 2014-06-22 MED ORDER — AMLODIPINE BESYLATE 5 MG PO TABS
5.0000 mg | ORAL_TABLET | Freq: Every day | ORAL | Status: DC
  Administered 2014-06-22 – 2014-06-23 (×2): 5 mg via ORAL
  Filled 2014-06-22 (×2): qty 1

## 2014-06-22 MED ORDER — POTASSIUM CHLORIDE 10 MEQ/100ML IV SOLN: 10.0000 meq | INTRAVENOUS | Status: DC

## 2014-06-22 MED ORDER — POTASSIUM CHLORIDE CRYS ER 20 MEQ PO TBCR
40.0000 meq | EXTENDED_RELEASE_TABLET | ORAL | Status: AC
  Administered 2014-06-22 (×2): 40 meq via ORAL
  Filled 2014-06-22 (×2): qty 2

## 2014-06-22 NOTE — Progress Notes (Signed)
  Subjective: Had bowel movement. Feels much better. No nausea or vomiting.  Objective: Vital signs in last 24 hours: Temp:  [98 F (36.7 C)-98.5 F (36.9 C)] 98 F (36.7 C) (01/21 0500) Pulse Rate:  [64-72] 65 (01/21 0500) Resp:  [18-20] 18 (01/21 0500) BP: (148-181)/(68-128) 158/68 mmHg (01/21 0500) SpO2:  [96 %-97 %] 96 % (01/21 0500) Last BM Date: 06/21/14  Intake/Output from previous day: 01/20 0701 - 01/21 0700 In: 0  Out: 5550 [Urine:5350; Emesis/NG output:200] Intake/Output this shift:    General appearance: alert, cooperative and no distress GI: soft, non-tender; bowel sounds normal; no masses,  no organomegaly  Lab Results:   Recent Labs  06/20/14 0100 06/21/14 0543  WBC 15.5* 13.9*  HGB 14.5 12.0  HCT 42.9 36.4  PLT 280 231   BMET  Recent Labs  06/21/14 0543 06/22/14 0540  NA 140 138  K 2.9* 2.8*  CL 108 105  CO2 27 25  GLUCOSE 94 93  BUN 8 6  CREATININE 0.60 0.56  CALCIUM 7.9* 8.6   PT/INR No results for input(s): LABPROT, INR in the last 72 hours.  Studies/Results: No results found.  Anti-infectives: Anti-infectives    None      Assessment/Plan: Impression: Small bowel obstruction, resolved Plan: Would supplement potassium. Okay for discharge from surgery standpoint.  LOS: 2 days    [name] [name] A 06/22/2014

## 2014-06-22 NOTE — Progress Notes (Signed)
[facility] PROGRESS NOTE  [name] [name] YKD:[id number] DOB: December 30, 1945 DOA: 06/20/2014 PCP: Purvis Kilts, MD  Assessment/Plan: Small bowel obstruction: Recurrent likely related to past surgeries and/or stricture. Resolved this am.  NG removed and she is tolerating full liquid diet.  Appreciate general surgery input.  Active Problems:  Hypokalemia: persistent related to GI losses and urine output secondary to IV fluids. Magnesium level 2.0. Will stop IV fluid. Will replete with po supplement.  Will recheck in am.      Hypertension: Controlled. She is on losartan and Norvasc at home. These were held while NPO.  Will resume today. Monitor closely    Hyperglycemia: resolved.   Code Status: full Family Communication: none present Disposition Plan: home tomorrow   Consultants:  General surgery  Procedures:  Ng tube placement  Antibiotics:  none  HPI/Subjective: Ambulating in hall with steady gait. Denies pain/discomfort. Report frequent voiding large amounts.   Objective: Filed Vitals:   06/22/14 1255  BP: 169/85  Pulse: 81  Temp: 98.2 F (36.8 C)  Resp: 18    Intake/Output Summary (Last 24 hours) at 06/22/14 1540 Last data filed at 06/22/14 1256  Gross per 24 hour  Intake 1487.5 ml  Output   5350 ml  Net -3862.5 ml   Filed Weights   06/20/14 0045  Weight: 89.812 kg (198 lb)    Exam:   General:  Well nourished NSD  Cardiovascular: RRR No MGR No LE edema  Respiratory: normal effort BS clear bilaterally no wheeze  Abdomen: non-distended non-tender +BS   Musculoskeletal: no clubbing or cyanosis   Data Reviewed: Basic Metabolic Panel:  Recent Labs Lab 06/20/14 0100 06/21/14 0543 06/22/14 0540 06/22/14 1355  NA 137 140 138 140  K 3.6 2.9* 2.8* 2.9*  CL 105 108 105 107  CO2 22 27 25 24   GLUCOSE 167* 94 93 113*  BUN 10 8 6  <5*  CREATININE 0.66 0.60 0.56 0.66  CALCIUM 8.9 7.9* 8.6 9.0  MG  --  1.9 2.0  --   PHOS  --   2.0* 2.9  --    Liver Function Tests:  Recent Labs Lab 06/20/14 0100  AST 41*  ALT 50*  ALKPHOS 68  BILITOT 0.8  PROT 7.2  ALBUMIN 4.2    Recent Labs Lab 06/20/14 0100  LIPASE 33   No results for input(s): AMMONIA in the last 168 hours. CBC:  Recent Labs Lab 06/20/14 0100 06/21/14 0543  WBC 15.5* 13.9*  NEUTROABS 12.0*  --   HGB 14.5 12.0  HCT 42.9 36.4  MCV 93.3 95.3  PLT 280 231   Cardiac Enzymes: No results for input(s): CKTOTAL, CKMB, CKMBINDEX, TROPONINI in the last 168 hours. BNP (last 3 results) No results for input(s): PROBNP in the last 8760 hours. CBG: No results for input(s): GLUCAP in the last 168 hours.  No results found for this or any previous visit (from the past 240 hour(s)).   Studies: No results found.  Scheduled Meds: . amLODipine  5 mg Oral Daily  . losartan  50 mg Oral Daily  . pantoprazole (PROTONIX) IV  40 mg Intravenous Q24H  . potassium chloride  40 mEq Oral Q4H   Continuous Infusions:   Principal Problem:   Small bowel obstruction Active Problems:   Nausea with vomiting   Abdominal pain   SBO (small bowel obstruction)   Hypertension   Hyperlipidemia   Hyperglycemia   AP (abdominal pain)   Hypokalemia    Time spent: 35  [facility] Pager [phone]. If 7PM-7AM, please contact night-coverage at [email or web address], password [facility] 06/22/2014, 3:40 PM  LOS: 2 days

## 2014-06-22 NOTE — Discharge Summary (Signed)
Physician Discharge Summary  [name] [name] YFV:[id number] DOB: 04/26/1946 DOA: 06/20/2014  PCP: Purvis Kilts, MD  Admit date: 06/20/2014 Discharge date: 06/22/2014  Time spent: 40 minutes  Recommendations for Outpatient Follow-up:  1. Dr Hilma Favors in 1 week for evaluation of resolution of SBO. Recommend BMET evaluate potassium level.  2. Follow full liquid diet and advance slowly as able.   Discharge Diagnoses:  Principal Problem:   Small bowel obstruction Active Problems:   Nausea with vomiting   Abdominal pain   SBO (small bowel obstruction)   Hypertension   Hyperlipidemia   Hyperglycemia   AP (abdominal pain)   Hypokalemia   Discharge Condition: stable  Diet recommendation: full liquid to advance as tolerated  Filed Weights   06/20/14 0045  Weight: 89.812 kg (198 lb)    History of present illness:  [name] [name] is a very pleasant 69 y.o. female with a past medical history that includes hypertension, hyperlipidemia, hiatal hernia, laparotomy and lysis of adhesions in 2014 presented to the emergency department on 06/20/14 with chief complaint abdominal pain, distention, nausea and vomiting. Initial evaluation in the emergency department reveals recurrent small bowel obstruction.  Patient reported she was in her usual state of health 4 days prior she developed mild abdominal discomfort that quickly resolved on its own. The night before presentation, she suddenly developed worsening abdominal pain. Associated symptoms included abdominal distention nausea and vomiting of greenish bile emesis. She denied any coffee ground emesis. She reported having a bowel movement yesterday which she described as normal in color and consistency. She denied fever chills or recent sick contacts. She denied any recent antibiotic use.  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 In the emergency department she wass afebrile hemodynamically stable and not hypoxic. She was provided with an NG tube that was draining small amount of greenish drainage. She reported improvement in abdominal distention   Hospital Course:  Small bowel obstruction: Recurrent likely related to past surgeries and/or stricture. Provided with NG tube and supportive therapy. Quickly improved. Evaluated by General surgery who opine no need for surgery.  At discharge tolerating full liquid diet without abdominal pain/nausea. Had BM and passing flatus Active Problems:     Hypertension: only fair control while NPO. At discharge controlled on home meds. Recommend close OP follow up for optimal control   Hypokalemia: required potassium supplements for 2 days. Likely related to GI losses and increased urine output from IV fluids. Magnesium 2.0.  At discharge potassium 3.6.     Hyperglycemia: resolved at discharge.     Procedures:  NG tube  Consultations:  Dr Arnoldo Morale general surgery  Discharge Exam: Filed Vitals:   06/22/14 1255  BP: 169/85  Pulse: 81  Temp: 98.2 F (36.8 C)  Resp: 18    General: well nourished ambulating in hall with steady gait Cardiovascular: RRR No MGR no LE edema Respiratory: normal effort BS clear bilaterally no wheeze  Abdomen: soft, non-distended non-tender to palpation. +BS  Discharge Instructions    Current Discharge Medication List    CONTINUE these medications which have NOT CHANGED   Details  amLODipine (NORVASC) 5 MG tablet Take 5 mg by mouth daily.    aspirin EC 81 MG tablet Take 81 mg by mouth daily.    atorvastatin (LIPITOR) 10 MG tablet Take 10 mg by mouth daily.    !!  beta carotene w/minerals (OCUVITE) tablet Take 1 tablet by mouth daily.    Calcium Carbonate-Vitamin D (CALCIUM 600 + D PO) Take 1 tablet by mouth 2 (two) times daily.    cholecalciferol (VITAMIN D) 1000 UNITS tablet Take  1,000 Units by mouth daily.    docusate sodium 100 MG CAPS Take 100 mg by mouth daily. Qty: 10 capsule, Refills: 0    Fish Oil-Cholecalciferol (FISH OIL + D3) 1000-1000 MG-UNIT CAPS Take 1 capsule by mouth 2 (two) times daily.    Lactobacillus-Inulin (CULTURELLE DIGESTIVE HEALTH PO) Take 1 capsule by mouth daily.     losartan (COZAAR) 50 MG tablet Take 50 mg by mouth daily.    !! Multiple Vitamin (MULTIVITAMIN WITH MINERALS) TABS Take 1 tablet by mouth daily.    omeprazole (PRILOSEC) 40 MG capsule Take 40 mg by mouth daily.    Potassium 99 MG TABS Take 1 tablet by mouth daily.    psyllium (METAMUCIL) 58.6 % packet Take 1 packet by mouth daily.     !! - Potential duplicate medications found. Please discuss with provider.     No Known Allergies    The results of significant diagnostics from this hospitalization (including imaging, microbiology, ancillary and laboratory) are listed below for reference.    Significant Diagnostic Studies: Ct Abdomen Pelvis W Contrast  06/20/2014   CLINICAL DATA:  Unspecified abdominal pain  EXAM: CT ABDOMEN AND PELVIS WITH CONTRAST  TECHNIQUE: Multidetector CT imaging of the abdomen and pelvis was performed using the standard protocol following bolus administration of intravenous contrast.  CONTRAST:  28mL OMNIPAQUE IOHEXOL 300 MG/ML SOLN, 148mL OMNIPAQUE IOHEXOL 300 MG/ML SOLN  COMPARISON:  07/08/2013  FINDINGS: BODY WALL: Unremarkable.  LOWER CHEST: No acute findings. Small nodule along the lower left major fissure is stable from 2014.  ABDOMEN/PELVIS:  Liver: Probable diffuse fatty infiltration.  Biliary: No evidence of biliary obstruction or stone.  Pancreas: Unremarkable.  Spleen: Unremarkable.  Adrenals: Unremarkable.  Kidneys and ureters: No hydronephrosis or stone.  Bladder: Unremarkable.  Reproductive: Hysterectomy with questionable right oophorectomy. Left ovary is unremarkable.  Bowel: Small bowel obstruction with dilated proximal and mid bowel  containing fluid. There is fecalization of contents in the ileal segments with abrupt transition in the right lower quadrant on image 69. These findings have all been noted previously. There is relative decompression of the terminal ileum and colon. No evidence for bowel necrosis or perforation. 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  Retroperitoneum: Prominent small bowel mesenteric lymph nodes, stable or decreased from prior.  Peritoneum: No ascites or pneumoperitoneum.  Vascular: No acute abnormality.  OSSEOUS: No acute abnormalities.  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   Electronically Signed   By: Jorje Guild M.D.   On: 06/20/2014 04:21   Dg Abd Acute W/chest  06/20/2014   CLINICAL DATA:  Abdominal pain  EXAM: ACUTE ABDOMEN SERIES (ABDOMEN 2 VIEW & CHEST 1 VIEW)  COMPARISON:  10/02/2013  FINDINGS: There is no evidence of dilated bowel loops or free intraperitoneal air. No concerning intra-abdominal mass effect or calcification (calcification overlapping the lower pole right kidney is mesenteric on abdominal CT 07/08/2013). Heart size and mediastinal contours are within normal limits. Both lungs are clear.  IMPRESSION: Negative abdominal radiographs.  No acute cardiopulmonary disease.   Electronically Signed   By: Jorje Guild M.D.   On: 06/20/2014 02:48    Microbiology: No results found for this or any previous visit (from the past 240  hour(s)).   Labs: Basic Metabolic Panel:  Recent Labs Lab 06/20/14 0100 06/21/14 0543 06/22/14 0540  NA 137 140 138  K 3.6 2.9* 2.8*  CL 105 108 105  CO2 22 27 25   GLUCOSE 167* 94 93  BUN 10 8 6   CREATININE 0.66 0.60 0.56  CALCIUM 8.9 7.9* 8.6  MG  --  1.9 2.0  PHOS  --  2.0* 2.9   Liver Function Tests:  Recent Labs Lab 06/20/14 0100  AST 41*  ALT 50*  ALKPHOS 68  BILITOT 0.8  PROT 7.2  ALBUMIN 4.2    Recent Labs Lab 06/20/14 0100  LIPASE 33   No  results for input(s): AMMONIA in the last 168 hours. CBC:  Recent Labs Lab 06/20/14 0100 06/21/14 0543  WBC 15.5* 13.9*  NEUTROABS 12.0*  --   HGB 14.5 12.0  HCT 42.9 36.4  MCV 93.3 95.3  PLT 280 231   Cardiac Enzymes: No results for input(s): CKTOTAL, CKMB, CKMBINDEX, TROPONINI in the last 168 hours. BNP: BNP (last 3 results) No results for input(s): PROBNP in the last 8760 hours. CBG: No results for input(s): GLUCAP in the last 168 hours.     SignedRadene Gunning  [facility] 06/22/2014, 1:48 PM

## 2014-06-23 LAB — BASIC METABOLIC PANEL
ANION GAP: 7 (ref 5–15)
BUN: 7 mg/dL (ref 6–23)
CHLORIDE: 109 meq/L (ref 96–112)
CO2: 23 mmol/L (ref 19–32)
Calcium: 8.8 mg/dL (ref 8.4–10.5)
Creatinine, Ser: 0.68 mg/dL (ref 0.50–1.10)
GFR, EST NON AFRICAN AMERICAN: 89 mL/min — AB (ref >90)
Glucose, Bld: 90 mg/dL (ref 70–99)
POTASSIUM: 3.6 mmol/L (ref 3.5–5.1)
SODIUM: 139 mmol/L (ref 135–145)

## 2014-06-23 MED ORDER — SALINE SPRAY 0.65 % NA SOLN
1.0000 | NASAL | Status: DC | PRN
  Administered 2014-06-23: 1 via NASAL
  Filled 2014-06-23: qty 44

## 2014-06-23 NOTE — Progress Notes (Signed)
Patient with orders to be discharge home. Discharge instructions given, patient verbalized understanding. Patient stable. Patient left in private vehicle with family.  

## 2014-07-03 DIAGNOSIS — I10 Essential (primary) hypertension: Secondary | ICD-10-CM | POA: Diagnosis not present

## 2014-07-03 DIAGNOSIS — E782 Mixed hyperlipidemia: Secondary | ICD-10-CM | POA: Diagnosis not present

## 2014-07-03 DIAGNOSIS — E6609 Other obesity due to excess calories: Secondary | ICD-10-CM | POA: Diagnosis not present

## 2014-07-03 DIAGNOSIS — K5669 Other intestinal obstruction: Secondary | ICD-10-CM | POA: Diagnosis not present

## 2014-07-19 DIAGNOSIS — I10 Essential (primary) hypertension: Secondary | ICD-10-CM | POA: Diagnosis not present

## 2014-07-19 DIAGNOSIS — E6609 Other obesity due to excess calories: Secondary | ICD-10-CM | POA: Diagnosis not present

## 2014-07-19 DIAGNOSIS — E782 Mixed hyperlipidemia: Secondary | ICD-10-CM | POA: Diagnosis not present

## 2014-07-19 DIAGNOSIS — Z6834 Body mass index (BMI) 34.0-34.9, adult: Secondary | ICD-10-CM | POA: Diagnosis not present

## 2014-09-26 DIAGNOSIS — I10 Essential (primary) hypertension: Secondary | ICD-10-CM | POA: Diagnosis not present

## 2014-09-26 DIAGNOSIS — Z1389 Encounter for screening for other disorder: Secondary | ICD-10-CM | POA: Diagnosis not present

## 2014-09-26 DIAGNOSIS — E782 Mixed hyperlipidemia: Secondary | ICD-10-CM | POA: Diagnosis not present

## 2014-09-26 DIAGNOSIS — Z0001 Encounter for general adult medical examination with abnormal findings: Secondary | ICD-10-CM | POA: Diagnosis not present

## 2014-09-26 DIAGNOSIS — Z6833 Body mass index (BMI) 33.0-33.9, adult: Secondary | ICD-10-CM | POA: Diagnosis not present

## 2014-09-27 DIAGNOSIS — K219 Gastro-esophageal reflux disease without esophagitis: Secondary | ICD-10-CM | POA: Diagnosis not present

## 2014-09-27 DIAGNOSIS — E6609 Other obesity due to excess calories: Secondary | ICD-10-CM | POA: Diagnosis not present

## 2014-09-27 DIAGNOSIS — E782 Mixed hyperlipidemia: Secondary | ICD-10-CM | POA: Diagnosis not present

## 2014-09-27 DIAGNOSIS — I10 Essential (primary) hypertension: Secondary | ICD-10-CM | POA: Diagnosis not present

## 2014-09-27 DIAGNOSIS — M179 Osteoarthritis of knee, unspecified: Secondary | ICD-10-CM | POA: Diagnosis not present

## 2014-10-03 ENCOUNTER — Other Ambulatory Visit: Payer: Self-pay

## 2014-10-03 DIAGNOSIS — Z1231 Encounter for screening mammogram for malignant neoplasm of breast: Secondary | ICD-10-CM

## 2014-11-07 ENCOUNTER — Ambulatory Visit
Admission: RE | Admit: 2014-11-07 | Discharge: 2014-11-07 | Disposition: A | Discharge status: Home / Self Care | Payer: Medicare Other | Source: Ambulatory Visit

## 2014-11-07 DIAGNOSIS — Z1231 Encounter for screening mammogram for malignant neoplasm of breast: Secondary | ICD-10-CM | POA: Diagnosis not present

## 2014-11-21 DIAGNOSIS — J329 Chronic sinusitis, unspecified: Secondary | ICD-10-CM | POA: Diagnosis not present

## 2014-11-21 DIAGNOSIS — J069 Acute upper respiratory infection, unspecified: Secondary | ICD-10-CM | POA: Diagnosis not present

## 2014-11-21 DIAGNOSIS — J40 Bronchitis, not specified as acute or chronic: Secondary | ICD-10-CM | POA: Diagnosis not present

## 2014-11-27 ENCOUNTER — Other Ambulatory Visit: Payer: Self-pay

## 2015-03-24 ENCOUNTER — Emergency Department (HOSPITAL_COMMUNITY): Payer: Medicare Other

## 2015-03-24 ENCOUNTER — Encounter (HOSPITAL_COMMUNITY): Payer: Self-pay | Admitting: *Deleted

## 2015-03-24 ENCOUNTER — Inpatient Hospital Stay (HOSPITAL_COMMUNITY)
Admission: EM | Admit: 2015-03-24 | Discharge: 2015-03-26 | DRG: 390 | Disposition: A | Discharge status: Home / Self Care | Payer: Medicare Other | Attending: Internal Medicine | Admitting: Internal Medicine

## 2015-03-24 DIAGNOSIS — K5660 Unspecified intestinal obstruction: Principal | ICD-10-CM | POA: Diagnosis present

## 2015-03-24 DIAGNOSIS — K219 Gastro-esophageal reflux disease without esophagitis: Secondary | ICD-10-CM | POA: Diagnosis not present

## 2015-03-24 DIAGNOSIS — R1110 Vomiting, unspecified: Secondary | ICD-10-CM | POA: Diagnosis not present

## 2015-03-24 DIAGNOSIS — I10 Essential (primary) hypertension: Secondary | ICD-10-CM | POA: Diagnosis present

## 2015-03-24 DIAGNOSIS — E785 Hyperlipidemia, unspecified: Secondary | ICD-10-CM | POA: Diagnosis present

## 2015-03-24 DIAGNOSIS — K56609 Unspecified intestinal obstruction, unspecified as to partial versus complete obstruction: Secondary | ICD-10-CM | POA: Diagnosis present

## 2015-03-24 DIAGNOSIS — R112 Nausea with vomiting, unspecified: Secondary | ICD-10-CM | POA: Diagnosis not present

## 2015-03-24 DIAGNOSIS — D72829 Elevated white blood cell count, unspecified: Secondary | ICD-10-CM | POA: Diagnosis not present

## 2015-03-24 DIAGNOSIS — R109 Unspecified abdominal pain: Secondary | ICD-10-CM | POA: Diagnosis not present

## 2015-03-24 DIAGNOSIS — K5669 Other intestinal obstruction: Secondary | ICD-10-CM | POA: Diagnosis not present

## 2015-03-24 HISTORY — DX: Unspecified intestinal obstruction, unspecified as to partial versus complete obstruction: K56.609

## 2015-03-24 LAB — COMPREHENSIVE METABOLIC PANEL
ALT: 58 U/L — ABNORMAL HIGH (ref 14–54)
AST: 56 U/L — AB (ref 15–41)
Albumin: 4 g/dL (ref 3.5–5.0)
Alkaline Phosphatase: 77 U/L (ref 38–126)
Anion gap: 11 (ref 5–15)
BILIRUBIN TOTAL: 0.8 mg/dL (ref 0.3–1.2)
BUN: 12 mg/dL (ref 6–20)
CO2: 24 mmol/L (ref 22–32)
Calcium: 9.2 mg/dL (ref 8.9–10.3)
Chloride: 104 mmol/L (ref 101–111)
Creatinine, Ser: 0.55 mg/dL (ref 0.44–1.00)
GFR calc Af Amer: >60 mL/min (ref >60)
GFR calc non Af Amer: >60 mL/min (ref >60)
Glucose, Bld: 118 mg/dL — ABNORMAL HIGH (ref 65–99)
POTASSIUM: 3.7 mmol/L (ref 3.5–5.1)
Sodium: 139 mmol/L (ref 135–145)
TOTAL PROTEIN: 7.4 g/dL (ref 6.5–8.1)

## 2015-03-24 LAB — URINALYSIS, ROUTINE W REFLEX MICROSCOPIC
Bilirubin Urine: NEGATIVE
GLUCOSE, UA: NEGATIVE mg/dL
Hgb urine dipstick: NEGATIVE
KETONES UR: NEGATIVE mg/dL
Leukocytes, UA: NEGATIVE
NITRITE: NEGATIVE
PH: 6 (ref 5.0–8.0)
Protein, ur: NEGATIVE mg/dL
Specific Gravity, Urine: 1.02 (ref 1.005–1.030)
Urobilinogen, UA: 0.2 mg/dL (ref 0.0–1.0)

## 2015-03-24 LAB — CBC
HEMATOCRIT: 43.9 % (ref 36.0–46.0)
Hemoglobin: 14.9 g/dL (ref 12.0–15.0)
MCH: 31.5 pg (ref 26.0–34.0)
MCHC: 33.9 g/dL (ref 30.0–36.0)
MCV: 92.8 fL (ref 78.0–100.0)
Platelets: 269 10*3/uL (ref 150–400)
RBC: 4.73 MIL/uL (ref 3.87–5.11)
RDW: 12.9 % (ref 11.5–15.5)
WBC: 15.7 10*3/uL — ABNORMAL HIGH (ref 4.0–10.5)

## 2015-03-24 LAB — LIPASE, BLOOD: LIPASE: 27 U/L (ref 11–51)

## 2015-03-24 LAB — GLUCOSE, CAPILLARY: Glucose-Capillary: 121 mg/dL — ABNORMAL HIGH (ref 65–99)

## 2015-03-24 MED ORDER — SODIUM CHLORIDE 0.9 % IJ SOLN
INTRAMUSCULAR | Status: AC
  Administered 2015-03-24: 15:00:00
  Filled 2015-03-24: qty 45

## 2015-03-24 MED ORDER — MORPHINE SULFATE (PF) 4 MG/ML IV SOLN
4.0000 mg | Freq: Once | INTRAVENOUS | Status: AC
  Administered 2015-03-24: 4 mg via INTRAVENOUS
  Filled 2015-03-24: qty 1

## 2015-03-24 MED ORDER — SODIUM CHLORIDE 0.9 % IV SOLN: Freq: Once | INTRAVENOUS | Status: DC

## 2015-03-24 MED ORDER — BENZOCAINE (TOPICAL) 20 % EX AERO
INHALATION_SPRAY | Freq: Four times a day (QID) | CUTANEOUS | Status: DC | PRN
  Administered 2015-03-25 (×2): via OROMUCOSAL
  Filled 2015-03-24: qty 57

## 2015-03-24 MED ORDER — IOHEXOL 300 MG/ML  SOLN
25.0000 mL | Freq: Once | INTRAMUSCULAR | Status: AC | PRN
  Administered 2015-03-24: 25 mL via ORAL

## 2015-03-24 MED ORDER — PANTOPRAZOLE SODIUM 40 MG IV SOLR
40.0000 mg | INTRAVENOUS | Status: DC
  Administered 2015-03-24 – 2015-03-25 (×2): 40 mg via INTRAVENOUS
  Filled 2015-03-24 (×2): qty 40

## 2015-03-24 MED ORDER — IOHEXOL 300 MG/ML  SOLN
100.0000 mL | Freq: Once | INTRAMUSCULAR | Status: AC | PRN
  Administered 2015-03-24: 100 mL via INTRAVENOUS

## 2015-03-24 MED ORDER — HYDRALAZINE HCL 20 MG/ML IJ SOLN: 5.0000 mg | INTRAMUSCULAR | Status: DC | PRN

## 2015-03-24 MED ORDER — ENOXAPARIN SODIUM 40 MG/0.4ML ~~LOC~~ SOLN
40.0000 mg | SUBCUTANEOUS | Status: DC
  Administered 2015-03-24 – 2015-03-25 (×2): 40 mg via SUBCUTANEOUS
  Filled 2015-03-24 (×2): qty 0.4

## 2015-03-24 MED ORDER — SODIUM CHLORIDE 0.9 % IV BOLUS (SEPSIS)
1000.0000 mL | Freq: Once | INTRAVENOUS | Status: AC
  Administered 2015-03-24: 1000 mL via INTRAVENOUS

## 2015-03-24 MED ORDER — ONDANSETRON HCL 4 MG/2ML IJ SOLN
4.0000 mg | Freq: Four times a day (QID) | INTRAMUSCULAR | Status: DC | PRN
  Administered 2015-03-24: 4 mg via INTRAVENOUS
  Filled 2015-03-24: qty 2

## 2015-03-24 MED ORDER — ONDANSETRON HCL 4 MG/2ML IJ SOLN
4.0000 mg | Freq: Once | INTRAMUSCULAR | Status: AC
  Administered 2015-03-24: 4 mg via INTRAVENOUS
  Filled 2015-03-24: qty 2

## 2015-03-24 MED ORDER — LORAZEPAM 2 MG/ML IJ SOLN
0.5000 mg | Freq: Once | INTRAMUSCULAR | Status: AC
  Administered 2015-03-24: 0.5 mg via INTRAVENOUS
  Filled 2015-03-24: qty 1

## 2015-03-24 MED ORDER — ACETAMINOPHEN 650 MG RE SUPP: 650.0000 mg | Freq: Four times a day (QID) | RECTAL | Status: DC | PRN

## 2015-03-24 MED ORDER — ONDANSETRON HCL 4 MG PO TABS: 4.0000 mg | ORAL_TABLET | Freq: Four times a day (QID) | ORAL | Status: DC | PRN

## 2015-03-24 MED ORDER — SODIUM CHLORIDE 0.9 % IV SOLN
INTRAVENOUS | Status: DC
  Administered 2015-03-24 – 2015-03-25 (×3): via INTRAVENOUS

## 2015-03-24 MED ORDER — SODIUM CHLORIDE 0.9 % IV SOLN: INTRAVENOUS | Status: AC

## 2015-03-24 MED ORDER — OXYCODONE HCL 5 MG PO TABS: 5.0000 mg | ORAL_TABLET | ORAL | Status: DC | PRN

## 2015-03-24 MED ORDER — ACETAMINOPHEN 325 MG PO TABS
650.0000 mg | ORAL_TABLET | Freq: Four times a day (QID) | ORAL | Status: DC | PRN
  Administered 2015-03-25: 650 mg via ORAL
  Filled 2015-03-24: qty 2

## 2015-03-24 NOTE — ED Notes (Signed)
History of bowel obstruction, abdominal pain with nausea and vomiting

## 2015-03-24 NOTE — ED Provider Notes (Signed)
CSN: [id number]     Arrival date & time 03/24/15  1238 History   First MD Initiated Contact with Patient 03/24/15 1329     Chief Complaint  Patient presents with  . Abdominal Pain     (Consider location/radiation/quality/duration/timing/severity/associated sxs/prior Treatment) HPI  Pt with hx of multiple abdominal surgeries presenting with abdominal pain vomiting.  Pt states pain is located in upper abdomen and is constant.  Pain began last night.  This morning she began to have vomiting- nonbloody and nonbilious.  She had normal bowel movement this morning.  Pain has been worsening throughout the day and feels like her prior bowel obstructions.  She has not had any treatment prior to arrival.  There are no other associated systemic symptoms, there are no other alleviating or modifying factors.   Past Medical History  Diagnosis Date  . H/O hiatal hernia   . Hypertension   . Hyperlipidemia   . Bowel obstruction [facility])    Past Surgical History  Procedure Laterality Date  . Appendectomy    . Abdominal hysterectomy    . Colonoscopy  03/18/2012    Procedure: COLONOSCOPY;  Surgeon: Rogene Houston, MD;  Location: [facility] ENDO SUITE;  Service: Endoscopy;  Laterality: N/A;  930  . Laparotomy N/A 02/14/2013    Procedure: EXPLORATORY LAPAROTOMY;  Surgeon: Jamesetta So, MD;  Location: [facility] ORS;  Service: General;  Laterality: N/A;   Family History  Problem Relation Age of Onset  . Cancer     Social History  Substance Use Topics  . Smoking status: Never Smoker   . Smokeless tobacco: None  . Alcohol Use: No   OB History    No data available     Review of Systems  ROS reviewed and all otherwise negative except for mentioned in HPI    Allergies  Review of patient's allergies indicates no known allergies.  Home Medications   Prior to Admission medications   Medication Sig Start Date End Date Taking? Authorizing Provider  amLODipine (NORVASC) 5 MG tablet Take 5 mg by mouth daily.    Yes Historical Provider, MD  aspirin EC 81 MG tablet Take 81 mg by mouth daily.   Yes Historical Provider, MD  beta carotene w/minerals (OCUVITE) tablet Take 1 tablet by mouth daily.   Yes Historical Provider, MD  Calcium Carbonate-Vitamin D (CALCIUM 600 + D PO) Take 1 tablet by mouth 2 (two) times daily.   Yes Historical Provider, MD  cholecalciferol (VITAMIN D) 1000 UNITS tablet Take 1,000 Units by mouth daily.   Yes Historical Provider, MD  docusate sodium 100 MG CAPS Take 100 mg by mouth daily. Patient taking differently: Take 100 mg by mouth 2 (two) times daily.  10/03/13  Yes Felicie Morn, MD  Fish Oil-Cholecalciferol (FISH OIL + D3) 1000-1000 MG-UNIT CAPS Take 1 capsule by mouth 2 (two) times daily.   Yes Historical Provider, MD  Lactobacillus-Inulin ([place] PO) Take 1 capsule by mouth daily.    Yes Historical Provider, MD  losartan (COZAAR) 50 MG tablet Take 50 mg by mouth daily.   Yes Historical Provider, MD  Multiple Vitamin (MULTIVITAMIN WITH MINERALS) TABS Take 1 tablet by mouth daily.   Yes Historical Provider, MD  omeprazole (PRILOSEC) 40 MG capsule Take 40 mg by mouth daily.   Yes Historical Provider, MD  Potassium 99 MG TABS Take 1 tablet by mouth daily.   Yes Historical Provider, MD  psyllium (METAMUCIL) 58.6 % packet Take 1 packet by mouth daily.  Yes Historical Provider, MD  simvastatin (ZOCOR) 20 MG tablet Take 20 mg by mouth daily.   Yes Historical Provider, MD   BP 158/79 mmHg  Pulse 74  Temp(Src) 98.1 F (36.7 C) (Oral)  Resp 16  Ht 5\' 4"  (1.626 m)  Wt 192 lb (87.091 kg)  BMI 32.94 kg/m2  SpO2 97%  LMP 09/10/2014 (Exact Date)  Vitals reviewed Physical Exam  Physical Examination: General appearance - alert, well appearing, and in no distress Mental status - alert, oriented to person, place, and time Eyes - no conjunctival injection, no scleral icterus Mouth - mucous membranes moist, pharynx normal without lesions Chest - clear to  auscultation, no wheezes, rales or rhonchi, symmetric air entry Heart - normal rate, regular rhythm, normal S1, S2, no murmurs, rubs, clicks or gallops Abdomen - soft, ttp in epigastric region, no gaurding or rebound tenderness, nabs, nondistended, no masses or organomegaly Neurological - alert, oriented, normal speech Extremities - peripheral pulses normal, no pedal edema, no clubbing or cyanosis Skin - normal coloration and turgor, no rashes  ED Course  Procedures (including critical care time) Labs Review Labs Reviewed  CBC - Abnormal; Notable for the following:    WBC 15.7 (*)    All other components within normal limits  COMPREHENSIVE METABOLIC PANEL - Abnormal; Notable for the following:    Glucose, Bld 118 (*)    AST 56 (*)    ALT 58 (*)    All other components within normal limits  CBC - Abnormal; Notable for the following:    WBC 14.4 (*)    All other components within normal limits  GLUCOSE, CAPILLARY - Abnormal; Notable for the following:    Glucose-Capillary 121 (*)    All other components within normal limits  LIPASE, BLOOD  URINALYSIS, ROUTINE W REFLEX MICROSCOPIC (NOT AT [facility])    Imaging Review Ct Abdomen Pelvis W Contrast  03/24/2015  CLINICAL DATA:  Abdominal pain beginning last night. Vomiting this morning. History of small-bowel obstruction. EXAM: CT ABDOMEN AND PELVIS WITH CONTRAST TECHNIQUE: Multidetector CT imaging of the abdomen and pelvis was performed using the standard protocol following bolus administration of intravenous contrast. CONTRAST:  52mL OMNIPAQUE IOHEXOL 300 MG/ML SOLN, 13mL OMNIPAQUE IOHEXOL 300 MG/ML SOLN COMPARISON:  06/20/2014 FINDINGS: 5 mm sub solid nodule in the lingula along the major fissure is unchanged (series 6, image 3). Minimal atelectasis is present in both lung bases. There is no pleural effusion. Diffusely decreased attenuation of the liver is consistent with steatosis. 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 The stomach is moderately distended with oral contrast material. 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 There is no oral contrast within the bowel. The appendix is absent. Scattered colonic diverticular noted without evidence of diverticulitis. Mild dilatation of multiple small bowel loops is similar to the prior CT with a transition to decompressed distal small bowel in the right lower quadrant at the same location as on the prior study (series 2, image 69). There is fecalization of small bowel contents immediately proximal to this transition. The colon is decompressed. A mild aortoiliac atherosclerotic calcification is noted. No free air, free fluid, or enlarged retroperitoneal lymph nodes are seen. The small mesenteric lymph nodes are similar to the prior study. Lumbar disc degeneration predominantly at L5-S1. Lower lumbar facet arthrosis. IMPRESSION: Chronic/recurrent distal small bowel obstruction, not significantly changed in appearance  from the prior CT. Electronically Signed   By: Logan Bores M.D.   On: 03/24/2015 16:03   I have personally reviewed and evaluated these images and lab results as part of my medical decision-making.   EKG Interpretation None      MDM   Final diagnoses:  Small bowel obstruction ([place])    Pt presenting with abdominal pain and vomiting- symptoms feel similar to her prior SBOs, labs obtained, IV fluids given, patient made NPO, given antiemetics and pain medication.  Prior chart review reveals normal abdominal xrays with CT scan showing SBO at time of most recent episode.  Doubt perforation, so will proceed with CT scan at this time.  Pt signed out to Dr. Wyvonnia Dusky at change of shift pending CT scan.      Alfonzo Beers, MD 03/25/15 [phone]

## 2015-03-24 NOTE — ED Provider Notes (Signed)
Care assumed from Dr. Canary Brim. CT scan confirms recurrent small bowel obstruction. Abdomen soft with minimal epigastric tenderness. NG tube will be placed, IV fluids continued. Plan admission to the hospital. No indication for acute surgical intervention at this time.  D/w Dr. Marily Memos.     Ezequiel Essex, MD 03/24/15 [phone]

## 2015-03-24 NOTE — H&P (Signed)
[facility] History and Physical  [name] [name] NKN:[id number] DOB: September 20, 1945 DOA: 03/24/2015  Referring physician: Dr Wyvonnia Dusky - [facility] PCP: Purvis Kilts, MD   Chief Complaint: abd pain N/V  HPI: [name] [name] is a 69 y.o. female  Abd pain and n/v. Upper abdomen. Started overnigth. Nonbloody emesis today x5. Last BM in the am on day of admission. Pain was initially intermittent but now constant. Getting worse. Feels like prior SBO. Has not taken anything for symptoms. Nothing makes her symtpoms better or worse. Took morning medications and was able to keep them down for approximately 30 minutes to an hour before emesis.  Review of Systems:  Constitutional:  No weight loss, night sweats, Fevers, chills,  HEENT:  No headaches, Difficulty swallowing,Tooth/dental problems,Sore throat, Cardio-vascular:  No chest pain, Orthopnea, PND, swelling in lower extremities, anasarca, dizziness, palpitations  GI: Per HPi Resp:   No shortness of breath with exertion or at rest. No excess mucus, no productive cough, No non-productive cough, No coughing up of blood.No change in color of mucus.No wheezing.No chest wall deformity  Skin:  no rash or lesions.  GU:  no dysuria, change in color of urine, no urgency or frequency. No flank pain.  Musculoskeletal:   No joint pain or swelling. No decreased range of motion. No back pain.  Psych:  No change in mood or affect. No depression or anxiety. No memory loss.  Neuro:  No change in sensation, unilateral strength, or cognitive abilities  All other systems were reviewed and are negative.  Past Medical History  Diagnosis Date  . H/O hiatal hernia   . Hypertension   . Hyperlipidemia   . Bowel obstruction [facility])    Past Surgical History  Procedure Laterality Date  . Appendectomy    . Abdominal hysterectomy    . Colonoscopy  03/18/2012    Procedure: COLONOSCOPY;  Surgeon: Rogene Houston, MD;  Location: [facility];   Service: Endoscopy;  Laterality: N/A;  930  . Laparotomy N/A 02/14/2013    Procedure: EXPLORATORY LAPAROTOMY;  Surgeon: Jamesetta So, MD;  Location: [facility] ORS;  Service: General;  Laterality: N/A;   Social History:  reports that she has never smoked. She does not have any smokeless tobacco history on file. She reports that she does not drink alcohol or use illicit drugs.  No Known Allergies  Family History  Problem Relation Age of Onset  . Cancer       Prior to Admission medications   Medication Sig Start Date End Date Taking? Authorizing Provider  amLODipine (NORVASC) 5 MG tablet Take 5 mg by mouth daily.   Yes Historical Provider, MD  aspirin EC 81 MG tablet Take 81 mg by mouth daily.   Yes Historical Provider, MD  beta carotene w/minerals (OCUVITE) tablet Take 1 tablet by mouth daily.   Yes Historical Provider, MD  Calcium Carbonate-Vitamin D (CALCIUM 600 + D PO) Take 1 tablet by mouth 2 (two) times daily.   Yes Historical Provider, MD  cholecalciferol (VITAMIN D) 1000 UNITS tablet Take 1,000 Units by mouth daily.   Yes Historical Provider, MD  docusate sodium 100 MG CAPS Take 100 mg by mouth daily. Patient taking differently: Take 100 mg by mouth 2 (two) times daily.  10/03/13  Yes Felicie Morn, MD  Fish Oil-Cholecalciferol (FISH OIL + D3) 1000-1000 MG-UNIT CAPS Take 1 capsule by mouth 2 (two) times daily.   Yes Historical Provider, MD  Lactobacillus-Inulin (CULTURELLE DIGESTIVE HEALTH PO) Take 1 capsule  by mouth daily.    Yes Historical Provider, MD  losartan (COZAAR) 50 MG tablet Take 50 mg by mouth daily.   Yes Historical Provider, MD  Multiple Vitamin (MULTIVITAMIN WITH MINERALS) TABS Take 1 tablet by mouth daily.   Yes Historical Provider, MD  omeprazole (PRILOSEC) 40 MG capsule Take 40 mg by mouth daily.   Yes Historical Provider, MD  Potassium 99 MG TABS Take 1 tablet by mouth daily.   Yes Historical Provider, MD  psyllium (METAMUCIL) 58.6 % packet Take 1 packet by mouth daily.    Yes Historical Provider, MD  simvastatin (ZOCOR) 20 MG tablet Take 20 mg by mouth daily.   Yes Historical Provider, MD   Physical Exam: Filed Vitals:   03/24/15 1323  BP: 165/96  Pulse: 81  Temp: 97.8 F (36.6 C)  Resp: 20  Height: 5\' 4"  (1.626 m)  Weight: 87.091 kg (192 lb)  SpO2: 99%    Wt Readings from Last 3 Encounters:  03/24/15 87.091 kg (192 lb)  06/20/14 89.812 kg (198 lb)  10/02/13 92.1 kg (203 lb 0.7 oz)    General:  Appears calm and comfortable Eyes:  PERRL, EOMI, normal lids, iris ENT:  grossly normal hearing, lips & tongue Neck:  no LAD, masses or thyromegaly Cardiovascular:  RRR, no m/r/g. No LE edema.  Respiratory:  CTA bilaterally, no w/r/r. Normal respiratory effort. Abdomen: Nondistended, hypoactive bowel sounds, nontender to palpation. Skin:  no rash or induration seen on limited exam Musculoskeletal:  grossly normal tone BUE/BLE Psychiatric:  grossly normal mood and affect, speech fluent and appropriate Neurologic:  CN 2-12 grossly intact, moves all extremities in coordinated fashion.          Labs on Admission:  Basic Metabolic Panel:  Recent Labs Lab 03/24/15 1350  NA 139  K 3.7  CL 104  CO2 24  GLUCOSE 118*  BUN 12  CREATININE 0.55  CALCIUM 9.2   Liver Function Tests:  Recent Labs Lab 03/24/15 1350  AST 56*  ALT 58*  ALKPHOS 77  BILITOT 0.8  PROT 7.4  ALBUMIN 4.0    Recent Labs Lab 03/24/15 1350  LIPASE 27   No results for input(s): AMMONIA in the last 168 hours. CBC:  Recent Labs Lab 03/24/15 1350  WBC 15.7*  HGB 14.9  HCT 43.9  MCV 92.8  PLT 269   Cardiac Enzymes: No results for input(s): CKTOTAL, CKMB, CKMBINDEX, TROPONINI in the last 168 hours.  BNP (last 3 results) No results for input(s): BNP in the last 8760 hours.  ProBNP (last 3 results) No results for input(s): PROBNP in the last 8760 hours.  CBG: No results for input(s): GLUCAP in the last 168 hours.  Radiological Exams on Admission: Ct  Abdomen Pelvis W Contrast  03/24/2015  CLINICAL DATA:  Abdominal pain beginning last night. Vomiting this morning. History of small-bowel obstruction. EXAM: CT ABDOMEN AND PELVIS WITH CONTRAST TECHNIQUE: Multidetector CT imaging of the abdomen and pelvis was performed using the standard protocol following bolus administration of intravenous contrast. CONTRAST:  52mL OMNIPAQUE IOHEXOL 300 MG/ML SOLN, 110mL OMNIPAQUE IOHEXOL 300 MG/ML SOLN COMPARISON:  06/20/2014 FINDINGS: 5 mm sub solid nodule in the lingula along the major fissure is unchanged (series 6, image 3). Minimal atelectasis is present in both lung bases. There is no pleural effusion. Diffusely decreased attenuation of the liver is consistent with steatosis. 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 The stomach is moderately distended with oral contrast material. 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 There is no oral contrast within the bowel. The appendix is absent. Scattered colonic diverticular noted without evidence of diverticulitis. Mild dilatation of multiple small bowel loops is similar to the prior CT with a transition to decompressed distal small bowel in the right lower quadrant at the same location as on the prior study (series 2, image 69). There is fecalization of small bowel contents immediately proximal to this transition. The colon is decompressed. A mild aortoiliac atherosclerotic calcification is noted. No free air, free fluid, or enlarged retroperitoneal lymph nodes are seen. The small mesenteric lymph nodes are similar to the prior study. Lumbar disc degeneration predominantly at L5-S1. Lower lumbar facet arthrosis. IMPRESSION: Chronic/recurrent distal small bowel obstruction, not significantly changed in appearance from the prior CT. Electronically Signed   By: Logan Bores  M.D.   On: 03/24/2015 16:03     Assessment/Plan Principal Problem:   Small bowel obstruction (HCC) Active Problems:   Leukocytosis   Hyperlipidemia   AP (abdominal pain)   GERD (gastroesophageal reflux disease)   Recurrent SBO: Patient's symptoms and CT showing recurrence of SBO. Patients last bowel surgery was in 2014 and was an exploratory laparotomy. Afebrile vital signs stable. WBC 15. - MedSurg - Place NG tube and start intermittent suction - Consult Surgery if not improving or worsens - IVF - Hurricane spray  HTN: normotensive - HYdralazine PRN - hold home Norvasc and Losartan  Leukocytosis: WBC 15. Likely stress related. AFVSS.  - CBC in am  GERD: - IV ppi  HLD: - hold home statin until taking PO    Code Status: FULL  DVT Prophylaxis: Lovenox Family Communication: None Disposition Plan:  Pending Improvement    Andalyn Heckstall Lenna Sciara, MD Family Medicine [facility] [email or web address] Password [email or web address]

## 2015-03-24 NOTE — Progress Notes (Signed)
Received Report from [facility] in ED.

## 2015-03-24 NOTE — ED Notes (Signed)
Report given to [facility] on Dept 300, all questions answered

## 2015-03-25 DIAGNOSIS — K219 Gastro-esophageal reflux disease without esophagitis: Secondary | ICD-10-CM

## 2015-03-25 DIAGNOSIS — K5669 Other intestinal obstruction: Secondary | ICD-10-CM

## 2015-03-25 DIAGNOSIS — D72829 Elevated white blood cell count, unspecified: Secondary | ICD-10-CM

## 2015-03-25 LAB — CBC
HCT: 38.3 % (ref 36.0–46.0)
HEMOGLOBIN: 12.5 g/dL (ref 12.0–15.0)
MCH: 30.9 pg (ref 26.0–34.0)
MCHC: 32.6 g/dL (ref 30.0–36.0)
MCV: 94.6 fL (ref 78.0–100.0)
PLATELETS: 254 10*3/uL (ref 150–400)
RBC: 4.05 MIL/uL (ref 3.87–5.11)
RDW: 13.2 % (ref 11.5–15.5)
WBC: 14.4 10*3/uL — AB (ref 4.0–10.5)

## 2015-03-25 MED ORDER — BENZOCAINE 20 % MT SOLN
OROMUCOSAL | Status: AC
  Filled 2015-03-25: qty 57

## 2015-03-25 MED ORDER — INFLUENZA VAC SPLIT QUAD 0.5 ML IM SUSY
0.5000 mL | PREFILLED_SYRINGE | INTRAMUSCULAR | Status: AC
  Administered 2015-03-26: 0.5 mL via INTRAMUSCULAR
  Filled 2015-03-25: qty 0.5

## 2015-03-25 NOTE — Progress Notes (Signed)
[facility] PROGRESS NOTE  [name] [name] YIA:[id number] DOB: 1946-02-16 DOA: 03/24/2015 PCP: Purvis Kilts, MD  Assessment/Plan: PSBO -No abd pain/n/v. -Would like to remove NG tube and advance diet. -Will plan to clamp NG and give clears; if tolerates will DC NG later today. -Has been passing gas, no stool yet.  HTN -Fair control. -PO meds on hold given NPO state.  Leukocytosis -Slightly improved.  GERD -Continue IV PPI.  Hyperlipidemia -Hold meds until oral route is resumed.  Code Status: Full Code Family Communication: Sister [name] [name] at bedside updated on plan of care and all questions answered.  Disposition Plan: Home when ready; anticipate 24-48 hours.   Consultants:  None   Antibiotics:  None   Subjective: Feels improved. Wants NG out, wants to eat.  Objective: Filed Vitals:   03/24/15 1600 03/24/15 1715 03/24/15 1954 03/25/15 0456  BP: 148/96 151/85 132/75 158/79  Pulse: 138 84 90 74  Temp:  98.1 F (36.7 C) 98 F (36.7 C) 98.1 F (36.7 C)  TempSrc:  Oral Axillary Oral  Resp: 15 16 15 16   Height:  5\' 4"  (1.626 m)    Weight:  87.091 kg (192 lb)    SpO2: 89% 93% 93% 97%    Intake/Output Summary (Last 24 hours) at 03/25/15 1319 Last data filed at 03/25/15 0900  Gross per 24 hour  Intake 881.25 ml  Output    800 ml  Net  81.25 ml   Filed Weights   03/24/15 1323 03/24/15 1715  Weight: 87.091 kg (192 lb) 87.091 kg (192 lb)    Exam:   General:  AA Ox3  Cardiovascular: RRR  Respiratory: CTA B  Abdomen: S/NT/slightlsy distended/non TTP/+BS  Extremities: no C/C/E   Neurologic:  Grossly intact and non-focal  Data Reviewed: Basic Metabolic Panel:  Recent Labs Lab 03/24/15 1350  NA 139  K 3.7  CL 104  CO2 24  GLUCOSE 118*  BUN 12  CREATININE 0.55  CALCIUM 9.2   Liver Function Tests:  Recent Labs Lab 03/24/15 1350  AST 56*  ALT 58*  ALKPHOS 77  BILITOT 0.8  PROT 7.4  ALBUMIN 4.0    Recent  Labs Lab 03/24/15 1350  LIPASE 27   No results for input(s): AMMONIA in the last 168 hours. CBC:  Recent Labs Lab 03/24/15 1350 03/25/15 0503  WBC 15.7* 14.4*  HGB 14.9 12.5  HCT 43.9 38.3  MCV 92.8 94.6  PLT 269 254   Cardiac Enzymes: No results for input(s): CKTOTAL, CKMB, CKMBINDEX, TROPONINI in the last 168 hours. BNP (last 3 results) No results for input(s): BNP in the last 8760 hours.  ProBNP (last 3 results) No results for input(s): PROBNP in the last 8760 hours.  CBG:  Recent Labs Lab 03/24/15 1952  GLUCAP 121*    No results found for this or any previous visit (from the past 240 hour(s)).   Studies: Ct Abdomen Pelvis W Contrast  03/24/2015  CLINICAL DATA:  Abdominal pain beginning last night. Vomiting this morning. History of small-bowel obstruction. EXAM: CT ABDOMEN AND PELVIS WITH CONTRAST TECHNIQUE: Multidetector CT imaging of the abdomen and pelvis was performed using the standard protocol following bolus administration of intravenous contrast. CONTRAST:  93mL OMNIPAQUE IOHEXOL 300 MG/ML SOLN, 128mL OMNIPAQUE IOHEXOL 300 MG/ML SOLN COMPARISON:  06/20/2014 FINDINGS: 5 mm sub solid nodule in the lingula along the major fissure is unchanged (series 6, image 3). Minimal atelectasis is present in both lung bases. There is no pleural effusion.  Diffusely decreased attenuation of the liver is consistent with steatosis. 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 The stomach is moderately distended with oral contrast material. 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 There is no oral contrast within the bowel. The appendix is absent. Scattered colonic diverticular noted without evidence of diverticulitis. Mild dilatation of multiple small bowel loops is similar to the prior CT with a transition to decompressed distal  small bowel in the right lower quadrant at the same location as on the prior study (series 2, image 69). There is fecalization of small bowel contents immediately proximal to this transition. The colon is decompressed. A mild aortoiliac atherosclerotic calcification is noted. No free air, free fluid, or enlarged retroperitoneal lymph nodes are seen. The small mesenteric lymph nodes are similar to the prior study. Lumbar disc degeneration predominantly at L5-S1. Lower lumbar facet arthrosis. IMPRESSION: Chronic/recurrent distal small bowel obstruction, not significantly changed in appearance from the prior CT. Electronically Signed   By: Logan Bores M.D.   On: 03/24/2015 16:03    Scheduled Meds: . sodium chloride   Intravenous Once  . sodium chloride   Intravenous STAT  . enoxaparin (LOVENOX) injection  40 mg Subcutaneous Q24H  . pantoprazole (PROTONIX) IV  40 mg Intravenous Q24H   Continuous Infusions: . sodium chloride 75 mL/hr at 03/25/15 8719    Principal Problem:   Small bowel obstruction (HCC) Active Problems:   Leukocytosis   Hyperlipidemia   AP (abdominal pain)   GERD (gastroesophageal reflux disease)    Time spent: 25 minutes. Greater than 50% of this time was spent in direct contact with the patient coordinating care.    Lelon Frohlich  [facility] Pager [phone]  If 7PM-7AM, please contact night-coverage at [email or web address], password [email or web address] 03/25/2015, 1:19 PM  LOS: 1 day

## 2015-03-25 NOTE — Progress Notes (Signed)
Pt's NG tube clamped, suction discontinued and advanced to clear liquid diet. Pt tolerated diet well. Pt had no episode of N/V. NG tube D/C and tolerated well. Rn will continue to monitor. Oswald Hillock, RN

## 2015-03-26 DIAGNOSIS — E785 Hyperlipidemia, unspecified: Secondary | ICD-10-CM

## 2015-03-26 DIAGNOSIS — K5660 Unspecified intestinal obstruction: Secondary | ICD-10-CM | POA: Diagnosis not present

## 2015-03-26 LAB — CBC
HCT: 39.6 % (ref 36.0–46.0)
HEMOGLOBIN: 13 g/dL (ref 12.0–15.0)
MCH: 31 pg (ref 26.0–34.0)
MCHC: 32.8 g/dL (ref 30.0–36.0)
MCV: 94.3 fL (ref 78.0–100.0)
Platelets: 226 10*3/uL (ref 150–400)
RBC: 4.2 MIL/uL (ref 3.87–5.11)
RDW: 13 % (ref 11.5–15.5)
WBC: 8.4 10*3/uL (ref 4.0–10.5)

## 2015-03-26 LAB — BASIC METABOLIC PANEL
Anion gap: 7 (ref 5–15)
BUN: 6 mg/dL (ref 6–20)
CHLORIDE: 105 mmol/L (ref 101–111)
CO2: 26 mmol/L (ref 22–32)
CREATININE: 0.59 mg/dL (ref 0.44–1.00)
Calcium: 8.4 mg/dL — ABNORMAL LOW (ref 8.9–10.3)
GFR calc Af Amer: >60 mL/min (ref >60)
GFR calc non Af Amer: >60 mL/min (ref >60)
GLUCOSE: 95 mg/dL (ref 65–99)
POTASSIUM: 3.2 mmol/L — AB (ref 3.5–5.1)
Sodium: 138 mmol/L (ref 135–145)

## 2015-03-26 NOTE — Care Management Note (Signed)
Case Management Note  Patient Details  Name: [name] [name] MRN: [id number] Date of Birth: May 28, 1946  Subjective/Objective:                  Pt admitted from home with SBO. Pt lives alone and will return home at discharge. Pt is independent with ADL's.  Action/Plan: Pt discharged home today. No CM needs noted.  Expected Discharge Date:                  Expected Discharge Plan:  Home/Self Care  In-House Referral:  NA  Discharge planning Services  CM Consult  Post Acute Care Choice:  NA Choice offered to:  NA  DME Arranged:    DME Agency:     HH Arranged:    HH Agency:     Status of Service:  Completed, signed off  Medicare Important Message Given:    Date Medicare IM Given:    Medicare IM give by:    Date Additional Medicare IM Given:    Additional Medicare Important Message give by:     If discussed at [place] of Stay Meetings, dates discussed:    Additional Comments:  Joylene Draft, RN 03/26/2015, 1:50 PM

## 2015-03-26 NOTE — Care Management Important Message (Signed)
Important Message  Patient Details  Name: CAMPBELL AGRAMONTE MRN: [id number] Date of Birth: September 24, 1945   Medicare Important Message Given:  Yes-second notification given    Joylene Draft, RN 03/26/2015, 1:51 PM

## 2015-03-26 NOTE — Discharge Summary (Signed)
Physician Discharge Summary  [name] [name] YBW:[id number] DOB: 16-Jan-1946 DOA: 03/24/2015  PCP: Purvis Kilts, MD  Admit date: 03/24/2015 Discharge date: 03/26/2015  Time spent: 45 minutes  Recommendations for Outpatient Follow-up:  -Will be discharged home today. -Advised to follow up with PCP in 2 weeks.   Discharge Diagnoses:  Principal Problem:   Small bowel obstruction ([place]) Active Problems:   Leukocytosis   Hyperlipidemia   AP (abdominal pain)   GERD (gastroesophageal reflux disease)   Discharge Condition: Stable and improved  Filed Weights   03/24/15 1323 03/24/15 1715  Weight: 87.091 kg (192 lb) 87.091 kg (192 lb)    History of present illness:  As per Dr. Marily Memos 10/22: [name] [name] is a 69 y.o. female  Abd pain and n/v. Upper abdomen. Started overnigth. Nonbloody emesis today x5. Last BM in the am on day of admission. Pain was initially intermittent but now constant. Getting worse. Feels like prior SBO. Has not taken anything for symptoms. Nothing makes her symtpoms better or worse. Took morning medications and was able to keep them down for approximately 30 minutes to an hour before emesis.   Hospital Course:   PSBO -No abd pain/n/v. -NG has been removed and is tolerating solid diet. -Has been passing gas, no stool yet.  HTN -Fair control. -Continue home meds.  Leukocytosis -Improved.  GERD -Continue  PPI.  Hyperlipidemia -Continue statin.   Procedures:  None   Consultations:  None  Discharge Instructions  Discharge Instructions    Increase activity slowly    Complete by:  As directed             Medication List    TAKE these medications        amLODipine 5 MG tablet  Commonly known as:  NORVASC  Take 5 mg by mouth daily.     aspirin EC 81 MG tablet  Take 81 mg by mouth daily.     beta carotene w/minerals tablet  Take 1 tablet by mouth daily.     CALCIUM 600 + D PO  Take 1 tablet by mouth 2  (two) times daily.     cholecalciferol 1000 UNITS tablet  Commonly known as:  VITAMIN D  Take 1,000 Units by mouth daily.     CULTURELLE DIGESTIVE HEALTH PO  Take 1 capsule by mouth daily.     DSS 100 MG Caps  Take 100 mg by mouth daily.     FISH OIL + D3 1000-1000 MG-UNIT Caps  Take 1 capsule by mouth 2 (two) times daily.     losartan 50 MG tablet  Commonly known as:  COZAAR  Take 50 mg by mouth daily.     multivitamin with minerals Tabs tablet  Take 1 tablet by mouth daily.     omeprazole 40 MG capsule  Commonly known as:  PRILOSEC  Take 40 mg by mouth daily.     Potassium 99 MG Tabs  Take 1 tablet by mouth daily.     psyllium 58.6 % packet  Commonly known as:  METAMUCIL  Take 1 packet by mouth daily.     simvastatin 20 MG tablet  Commonly known as:  ZOCOR  Take 20 mg by mouth daily.       No Known Allergies     Follow-up Information    Follow up with Purvis Kilts, MD. Schedule an appointment as soon as possible for a visit in 2 weeks.   Specialty:  Family Medicine  Contact information:   [address] [place] [address] [phone]        The results of significant diagnostics from this hospitalization (including imaging, microbiology, ancillary and laboratory) are listed below for reference.    Significant Diagnostic Studies: Ct Abdomen Pelvis W Contrast  03/24/2015  CLINICAL DATA:  Abdominal pain beginning last night. Vomiting this morning. History of small-bowel obstruction. EXAM: CT ABDOMEN AND PELVIS WITH CONTRAST TECHNIQUE: Multidetector CT imaging of the abdomen and pelvis was performed using the standard protocol following bolus administration of intravenous contrast. CONTRAST:  38mL OMNIPAQUE IOHEXOL 300 MG/ML SOLN, 112mL OMNIPAQUE IOHEXOL 300 MG/ML SOLN COMPARISON:  06/20/2014 FINDINGS: 5 mm sub solid nodule in the lingula along the major fissure is unchanged (series 6, image 3). Minimal atelectasis is present in both lung  bases. There is no pleural effusion. Diffusely decreased attenuation of the liver is consistent with steatosis. 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 The stomach is moderately distended with oral contrast material. 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 There is no oral contrast within the bowel. The appendix is absent. Scattered colonic diverticular noted without evidence of diverticulitis. Mild dilatation of multiple small bowel loops is similar to the prior CT with a transition to decompressed distal small bowel in the right lower quadrant at the same location as on the prior study (series 2, image 69). There is fecalization of small bowel contents immediately proximal to this transition. The colon is decompressed. A mild aortoiliac atherosclerotic calcification is noted. No free air, free fluid, or enlarged retroperitoneal lymph nodes are seen. The small mesenteric lymph nodes are similar to the prior study. Lumbar disc degeneration predominantly at L5-S1. Lower lumbar facet arthrosis. IMPRESSION: Chronic/recurrent distal small bowel obstruction, not significantly changed in appearance from the prior CT. Electronically Signed   By: Logan Bores M.D.   On: 03/24/2015 16:03    Microbiology: No results found for this or any previous visit (from the past 240 hour(s)).   Labs: Basic Metabolic Panel:  Recent Labs Lab 03/24/15 1350 03/26/15 0730  NA 139 138  K 3.7 3.2*  CL 104 105  CO2 24 26  GLUCOSE 118* 95  BUN 12 6  CREATININE 0.55 0.59  CALCIUM 9.2 8.4*   Liver Function Tests:  Recent Labs Lab 03/24/15 1350  AST 56*  ALT 58*  ALKPHOS 77  BILITOT 0.8  PROT 7.4  ALBUMIN 4.0    Recent Labs Lab 03/24/15 1350  LIPASE 27   No results for input(s): AMMONIA in the last 168 hours. CBC:  Recent Labs Lab 03/24/15 1350  03/25/15 0503 03/26/15 0730  WBC 15.7* 14.4* 8.4  HGB 14.9 12.5 13.0  HCT 43.9 38.3 39.6  MCV 92.8 94.6 94.3  PLT 269 254 226   Cardiac Enzymes: No results for input(s): CKTOTAL, CKMB, CKMBINDEX, TROPONINI in the last 168 hours. BNP: BNP (last 3 results) No results for input(s): BNP in the last 8760 hours.  ProBNP (last 3 results) No results for input(s): PROBNP in the last 8760 hours.  CBG:  Recent Labs Lab 03/24/15 1952  GLUCAP 121*       Signed:  Lelon Frohlich  [facility] Pager: [phone] 03/26/2015, 1:28 PM

## 2015-04-05 DIAGNOSIS — K5669 Other intestinal obstruction: Secondary | ICD-10-CM | POA: Diagnosis not present

## 2015-04-05 DIAGNOSIS — E6609 Other obesity due to excess calories: Secondary | ICD-10-CM | POA: Diagnosis not present

## 2015-04-05 DIAGNOSIS — Z1389 Encounter for screening for other disorder: Secondary | ICD-10-CM | POA: Diagnosis not present

## 2015-04-05 DIAGNOSIS — Z6832 Body mass index (BMI) 32.0-32.9, adult: Secondary | ICD-10-CM | POA: Diagnosis not present

## 2015-04-05 DIAGNOSIS — I10 Essential (primary) hypertension: Secondary | ICD-10-CM | POA: Diagnosis not present

## 2015-05-24 DIAGNOSIS — Z6832 Body mass index (BMI) 32.0-32.9, adult: Secondary | ICD-10-CM | POA: Diagnosis not present

## 2015-05-24 DIAGNOSIS — S20129A Blister (nonthermal) of breast, unspecified breast, initial encounter: Secondary | ICD-10-CM | POA: Diagnosis not present

## 2015-05-24 DIAGNOSIS — Z1389 Encounter for screening for other disorder: Secondary | ICD-10-CM | POA: Diagnosis not present

## 2015-05-24 DIAGNOSIS — N6001 Solitary cyst of right breast: Secondary | ICD-10-CM | POA: Diagnosis not present

## 2015-06-15 ENCOUNTER — Other Ambulatory Visit (HOSPITAL_COMMUNITY): Payer: Self-pay | Admitting: Family Medicine

## 2015-06-15 DIAGNOSIS — IMO0001 Reserved for inherently not codable concepts without codable children: Secondary | ICD-10-CM

## 2015-06-19 ENCOUNTER — Ambulatory Visit (HOSPITAL_COMMUNITY)
Admission: RE | Admit: 2015-06-19 | Discharge: 2015-06-19 | Disposition: A | Discharge status: Home / Self Care | Payer: Medicare Other | Source: Ambulatory Visit | Attending: Family Medicine | Admitting: Family Medicine

## 2015-06-19 DIAGNOSIS — R928 Other abnormal and inconclusive findings on diagnostic imaging of breast: Secondary | ICD-10-CM | POA: Diagnosis not present

## 2015-06-19 DIAGNOSIS — IMO0001 Reserved for inherently not codable concepts without codable children: Secondary | ICD-10-CM

## 2015-06-19 DIAGNOSIS — L723 Sebaceous cyst: Secondary | ICD-10-CM | POA: Insufficient documentation

## 2015-07-24 DIAGNOSIS — Z6833 Body mass index (BMI) 33.0-33.9, adult: Secondary | ICD-10-CM | POA: Diagnosis not present

## 2015-07-24 DIAGNOSIS — Z1389 Encounter for screening for other disorder: Secondary | ICD-10-CM | POA: Diagnosis not present

## 2015-07-24 DIAGNOSIS — J0100 Acute maxillary sinusitis, unspecified: Secondary | ICD-10-CM | POA: Diagnosis not present

## 2015-07-24 DIAGNOSIS — J069 Acute upper respiratory infection, unspecified: Secondary | ICD-10-CM | POA: Diagnosis not present

## 2015-07-24 DIAGNOSIS — E6609 Other obesity due to excess calories: Secondary | ICD-10-CM | POA: Diagnosis not present

## 2015-08-28 DIAGNOSIS — H5711 Ocular pain, right eye: Secondary | ICD-10-CM | POA: Diagnosis not present

## 2015-08-28 DIAGNOSIS — H31092 Other chorioretinal scars, left eye: Secondary | ICD-10-CM | POA: Diagnosis not present

## 2015-08-28 DIAGNOSIS — H35342 Macular cyst, hole, or pseudohole, left eye: Secondary | ICD-10-CM | POA: Diagnosis not present

## 2015-08-28 DIAGNOSIS — Z961 Presence of intraocular lens: Secondary | ICD-10-CM | POA: Diagnosis not present

## 2015-08-28 DIAGNOSIS — H04123 Dry eye syndrome of bilateral lacrimal glands: Secondary | ICD-10-CM | POA: Diagnosis not present

## 2015-10-16 ENCOUNTER — Other Ambulatory Visit: Payer: Self-pay

## 2015-10-16 DIAGNOSIS — Z1231 Encounter for screening mammogram for malignant neoplasm of breast: Secondary | ICD-10-CM

## 2015-11-01 ENCOUNTER — Emergency Department (HOSPITAL_COMMUNITY): Payer: Medicare Other

## 2015-11-01 ENCOUNTER — Encounter (HOSPITAL_COMMUNITY): Payer: Self-pay | Admitting: Emergency Medicine

## 2015-11-01 ENCOUNTER — Inpatient Hospital Stay (HOSPITAL_COMMUNITY)
Admission: EM | Admit: 2015-11-01 | Discharge: 2015-11-03 | DRG: 390 | Disposition: A | Discharge status: Home / Self Care | Payer: Medicare Other | Attending: Internal Medicine | Admitting: Internal Medicine

## 2015-11-01 ENCOUNTER — Observation Stay (HOSPITAL_COMMUNITY): Payer: Medicare Other

## 2015-11-01 DIAGNOSIS — Z79899 Other long term (current) drug therapy: Secondary | ICD-10-CM

## 2015-11-01 DIAGNOSIS — K5669 Other intestinal obstruction: Principal | ICD-10-CM | POA: Diagnosis present

## 2015-11-01 DIAGNOSIS — R112 Nausea with vomiting, unspecified: Secondary | ICD-10-CM | POA: Diagnosis not present

## 2015-11-01 DIAGNOSIS — E785 Hyperlipidemia, unspecified: Secondary | ICD-10-CM | POA: Diagnosis not present

## 2015-11-01 DIAGNOSIS — R109 Unspecified abdominal pain: Secondary | ICD-10-CM | POA: Diagnosis not present

## 2015-11-01 DIAGNOSIS — K56600 Partial intestinal obstruction, unspecified as to cause: Secondary | ICD-10-CM

## 2015-11-01 DIAGNOSIS — E669 Obesity, unspecified: Secondary | ICD-10-CM | POA: Diagnosis present

## 2015-11-01 DIAGNOSIS — I10 Essential (primary) hypertension: Secondary | ICD-10-CM | POA: Diagnosis not present

## 2015-11-01 DIAGNOSIS — Z7982 Long term (current) use of aspirin: Secondary | ICD-10-CM

## 2015-11-01 DIAGNOSIS — R1013 Epigastric pain: Secondary | ICD-10-CM | POA: Diagnosis not present

## 2015-11-01 DIAGNOSIS — K56609 Unspecified intestinal obstruction, unspecified as to partial versus complete obstruction: Secondary | ICD-10-CM

## 2015-11-01 DIAGNOSIS — Z6833 Body mass index (BMI) 33.0-33.9, adult: Secondary | ICD-10-CM

## 2015-11-01 DIAGNOSIS — Z66 Do not resuscitate: Secondary | ICD-10-CM | POA: Diagnosis not present

## 2015-11-01 DIAGNOSIS — R1084 Generalized abdominal pain: Secondary | ICD-10-CM | POA: Diagnosis not present

## 2015-11-01 LAB — LIPASE, BLOOD: LIPASE: 23 U/L (ref 11–51)

## 2015-11-01 LAB — COMPREHENSIVE METABOLIC PANEL
ALK PHOS: 70 U/L (ref 38–126)
ALT: 81 U/L — AB (ref 14–54)
AST: 68 U/L — AB (ref 15–41)
Albumin: 4.4 g/dL (ref 3.5–5.0)
Anion gap: 8 (ref 5–15)
BUN: 11 mg/dL (ref 6–20)
CALCIUM: 9.3 mg/dL (ref 8.9–10.3)
CO2: 27 mmol/L (ref 22–32)
CREATININE: 0.67 mg/dL (ref 0.44–1.00)
Chloride: 104 mmol/L (ref 101–111)
Glucose, Bld: 107 mg/dL — ABNORMAL HIGH (ref 65–99)
Potassium: 3.5 mmol/L (ref 3.5–5.1)
Sodium: 139 mmol/L (ref 135–145)
Total Bilirubin: 0.8 mg/dL (ref 0.3–1.2)
Total Protein: 7.7 g/dL (ref 6.5–8.1)

## 2015-11-01 LAB — CBC WITH DIFFERENTIAL/PLATELET
BASOS PCT: 0 %
Basophils Absolute: 0.1 10*3/uL (ref 0.0–0.1)
EOS ABS: 0.2 10*3/uL (ref 0.0–0.7)
Eosinophils Relative: 1 %
HCT: 44.9 % (ref 36.0–46.0)
HEMOGLOBIN: 15 g/dL (ref 12.0–15.0)
Lymphocytes Relative: 18 %
Lymphs Abs: 2.4 10*3/uL (ref 0.7–4.0)
MCH: 31.4 pg (ref 26.0–34.0)
MCHC: 33.4 g/dL (ref 30.0–36.0)
MCV: 93.9 fL (ref 78.0–100.0)
MONOS PCT: 9 %
Monocytes Absolute: 1.1 10*3/uL — ABNORMAL HIGH (ref 0.1–1.0)
NEUTROS PCT: 72 %
Neutro Abs: 9.4 10*3/uL — ABNORMAL HIGH (ref 1.7–7.7)
PLATELETS: 276 10*3/uL (ref 150–400)
RBC: 4.78 MIL/uL (ref 3.87–5.11)
RDW: 12.4 % (ref 11.5–15.5)
WBC: 13.1 10*3/uL — AB (ref 4.0–10.5)

## 2015-11-01 LAB — URINALYSIS, ROUTINE W REFLEX MICROSCOPIC
BILIRUBIN URINE: NEGATIVE
Glucose, UA: NEGATIVE mg/dL
Hgb urine dipstick: NEGATIVE
NITRITE: NEGATIVE
SPECIFIC GRAVITY, URINE: 1.025 (ref 1.005–1.030)
pH: 6 (ref 5.0–8.0)

## 2015-11-01 LAB — URINE MICROSCOPIC-ADD ON: RBC / HPF: NONE SEEN RBC/hpf (ref 0–5)

## 2015-11-01 MED ORDER — IOPAMIDOL (ISOVUE-300) INJECTION 61%
100.0000 mL | Freq: Once | INTRAVENOUS | Status: AC | PRN
  Administered 2015-11-01: 100 mL via INTRAVENOUS

## 2015-11-01 MED ORDER — ACETAMINOPHEN 325 MG PO TABS: 650.0000 mg | ORAL_TABLET | Freq: Four times a day (QID) | ORAL | Status: DC | PRN

## 2015-11-01 MED ORDER — DIATRIZOATE MEGLUMINE & SODIUM 66-10 % PO SOLN
ORAL | Status: AC
  Filled 2015-11-01: qty 30

## 2015-11-01 MED ORDER — ONDANSETRON HCL 4 MG PO TABS: 4.0000 mg | ORAL_TABLET | Freq: Four times a day (QID) | ORAL | Status: DC | PRN

## 2015-11-01 MED ORDER — ONDANSETRON HCL 4 MG/2ML IJ SOLN
4.0000 mg | INTRAMUSCULAR | Status: AC | PRN
  Administered 2015-11-01 (×2): 4 mg via INTRAVENOUS
  Filled 2015-11-01 (×2): qty 2

## 2015-11-01 MED ORDER — ACETAMINOPHEN 650 MG RE SUPP
650.0000 mg | Freq: Four times a day (QID) | RECTAL | Status: DC | PRN
Start: 2015-11-01 — End: 2015-11-03

## 2015-11-01 MED ORDER — SODIUM CHLORIDE 0.9 % IV SOLN
INTRAVENOUS | Status: DC
  Administered 2015-11-01 – 2015-11-03 (×2): via INTRAVENOUS

## 2015-11-01 MED ORDER — MORPHINE SULFATE (PF) 4 MG/ML IV SOLN
4.0000 mg | INTRAVENOUS | Status: AC | PRN
  Administered 2015-11-01 (×2): 4 mg via INTRAVENOUS
  Filled 2015-11-01 (×2): qty 1

## 2015-11-01 MED ORDER — ONDANSETRON HCL 4 MG/2ML IJ SOLN
4.0000 mg | Freq: Three times a day (TID) | INTRAMUSCULAR | Status: AC | PRN
  Filled 2015-11-01: qty 2

## 2015-11-01 MED ORDER — ONDANSETRON HCL 4 MG/2ML IJ SOLN
4.0000 mg | Freq: Four times a day (QID) | INTRAMUSCULAR | Status: DC | PRN
  Administered 2015-11-02: 4 mg via INTRAVENOUS

## 2015-11-01 MED ORDER — MORPHINE SULFATE (PF) 2 MG/ML IV SOLN
2.0000 mg | INTRAVENOUS | Status: DC | PRN
  Administered 2015-11-02 (×2): 2 mg via INTRAVENOUS
  Filled 2015-11-01 (×2): qty 1

## 2015-11-01 MED ORDER — ENOXAPARIN SODIUM 40 MG/0.4ML ~~LOC~~ SOLN
40.0000 mg | SUBCUTANEOUS | Status: DC
  Administered 2015-11-02 – 2015-11-03 (×2): 40 mg via SUBCUTANEOUS
  Filled 2015-11-01 (×2): qty 0.4

## 2015-11-01 MED ORDER — HYDROMORPHONE HCL 1 MG/ML IJ SOLN: 1.0000 mg | INTRAMUSCULAR | Status: AC | PRN

## 2015-11-01 NOTE — H&P (Signed)
TRH H&P   Patient Demographics:    [name] [name], is a 70 y.o. female  MRN: [id number]   DOB - [birth year]  Admit Date - 11/01/2015  Outpatient Primary MD for the patient is Purvis Kilts, MD  Referring MD/NP/PA: Dr. Thurnell Garbe  Patient coming from: Home  Chief Complaint  Patient presents with  . Abdominal Pain  . Emesis      HPI:    [name] [name]  is a 70 y.o. female, With history of small bowel obstruction, hypertension, hyperlipidemia who came to the hospital with gradual onset of worsening generalized abdominal pain, associated with nausea and vomiting. Patient describes pain as aching, had to bowel movements today. She denies diarrhea. No fever or chills. No chest and shortness of breath. Black or bloody stool. In the ED CT scan of the abdomen was done which showed mild dilation of small bowel consistent with low-grade partial small bowel obstruction    Review of systems:    In addition to the HPI above,  No Fever-chills, No Headache, No changes with Vision or hearing, No problems swallowing food or Liquids, No Chest pain, Cough or Shortness of Breath, No Blood in stool or Urine, No dysuria, No new skin rashes or bruises, No new joints pains-aches,  No new weakness, tingling, numbness in any extremity, No recent weight gain or loss, No polyuria, polydypsia or polyphagia, No significant Mental Stressors.  A full 10 point Review of Systems was done, except as stated above, all other Review of Systems were negative.   With Past History of the following :    Past Medical History  Diagnosis Date  . H/O hiatal hernia   . Hypertension   . Hyperlipidemia   . Bowel obstruction [facility])       Past Surgical History  Procedure Laterality Date  . Appendectomy    . Abdominal hysterectomy    . Colonoscopy  03/18/2012    Procedure: COLONOSCOPY;  Surgeon: Rogene Houston, MD;   Location: [facility];  Service: Endoscopy;  Laterality: N/A;  930  . Laparotomy N/A 02/14/2013    Procedure: EXPLORATORY LAPAROTOMY;  Surgeon: Jamesetta So, MD;  Location: [facility] ORS;  Service: General;  Laterality: N/A;      Social History:     Social History  Substance Use Topics  . Smoking status: Never Smoker   . Smokeless tobacco: Not on file  . Alcohol Use: No     Lives - At home by herself  Mobility - no limitation of mobility     Family History :     Family History  Problem Relation Age of Onset  . Cancer        Home Medications:   Prior to Admission medications   Medication Sig Start Date End Date Taking? Authorizing Provider  amLODipine (NORVASC) 5 MG tablet Take 5 mg by mouth daily.   Yes Historical Provider, MD  aspirin EC 81 MG tablet Take 81 mg by mouth daily.   Yes Historical Provider, MD  atorvastatin (LIPITOR) 10 MG tablet Take 10 mg by mouth daily. 09/17/15  Yes Historical Provider, MD  beta carotene w/minerals (OCUVITE) tablet Take 1 tablet by mouth daily.   Yes Historical Provider, MD  Calcium Carbonate-Vitamin D (CALCIUM 600 + D PO) Take 1 tablet by mouth 2 (two) times daily.   Yes Historical Provider, MD  cholecalciferol (VITAMIN D) 1000 UNITS tablet Take 5,000 Units by mouth daily.    Yes Historical Provider, MD  docusate sodium 100 MG CAPS Take 100 mg by mouth daily. Patient taking differently: Take 100 mg by mouth 2 (two) times daily.  10/03/13  Yes Felicie Morn, MD  Fish Oil-Cholecalciferol (FISH OIL + D3) 1000-1000 MG-UNIT CAPS Take 1 capsule by mouth 2 (two) times daily.   Yes Historical Provider, MD  Lactobacillus-Inulin ([place] PO) Take 1 capsule by mouth daily.    Yes Historical Provider, MD  losartan (COZAAR) 50 MG tablet Take 50 mg by mouth daily.   Yes Historical Provider, MD  Multiple Vitamin (MULTIVITAMIN WITH MINERALS) TABS Take 1 tablet by mouth daily.   Yes Historical Provider, MD  Multiple Vitamins-Minerals  (EYE VITAMINS PO) Take by mouth. [place] Vitamin   Yes Historical Provider, MD  omeprazole (PRILOSEC) 40 MG capsule Take 40 mg by mouth daily.   Yes Historical Provider, MD  polyethylene glycol powder (GLYCOLAX/MIRALAX) powder Take 17 g by mouth daily.   Yes Historical Provider, MD  Potassium 99 MG TABS Take 1 tablet by mouth daily.   Yes Historical Provider, MD     Allergies:    No Known Allergies   Physical Exam:   Vitals  Blood pressure 159/89, pulse 68, temperature 98.4 F (36.9 C), temperature source Oral, resp. rate 18, height 5\' 6"  (1.676 m), weight 88.905 kg (196 lb), last menstrual period 09/10/2014, SpO2 97 %.   1. General Caucasian female* lying in bed in NAD, cooperative with exam  2. Normal affect and insight,  Awake Alert, Oriented X 3.  3. No F.N deficits, ALL C.Nerves Intact, Strength 5/5 all 4 extremities, Sensation intact all 4 extremities, Plantars down going.  4. Ears and Eyes appear Normal, Conjunctivae clear, PERRLA. Moist Oral Mucosa.  5. Supple Neck, No JVD, No cervical lymphadenopathy appriciated, No Carotid Bruits.  6. Symmetrical Chest wall movement, Good air movement bilaterally, CTAB.  7. RRR, No Gallops, Rubs or Murmurs, No Parasternal Heave.  8. Positive Bowel Sounds, Abdomen Soft, mild generalized tenderness to palpation, No organomegaly appriciated,No rebound -guarding or rigidity.  9.  No Cyanosis, Normal Skin Turgor, No Skin Rash or Bruise.  10. Good muscle tone,  joints appear normal , no effusions, Normal ROM.      Data Review:    CBC  Recent Labs Lab 11/01/15 1920  WBC 13.1*  HGB 15.0  HCT 44.9  PLT 276  MCV 93.9  MCH 31.4  MCHC 33.4  RDW 12.4  LYMPHSABS 2.4  MONOABS 1.1*  EOSABS 0.2  BASOSABS 0.1   ------------------------------------------------------------------------------------------------------------------  Chemistries   Recent Labs Lab 11/01/15 1920  NA 139  K 3.5  CL 104  CO2 27  GLUCOSE  107*  BUN 11  CREATININE 0.67  CALCIUM 9.3  AST 68*  ALT 81*  ALKPHOS 70  BILITOT 0.8   -------------------------------------------------------------------------------------------------------------------    ---------------------------------------------------------------------------------------------------------------  Urinalysis    Component Value Date/Time   COLORURINE YELLOW 11/01/2015 1930   APPEARANCEUR CLEAR 11/01/2015 1930   LABSPEC 1.025 11/01/2015 1930   PHURINE 6.0 11/01/2015 1930  GLUCOSEU NEGATIVE 11/01/2015 1930   HGBUR NEGATIVE 11/01/2015 1930   BILIRUBINUR NEGATIVE 11/01/2015 1930   KETONESUR TRACE* 11/01/2015 1930   PROTEINUR TRACE* 11/01/2015 1930   UROBILINOGEN 0.2 03/24/2015 2055   NITRITE NEGATIVE 11/01/2015 1930   LEUKOCYTESUR SMALL* 11/01/2015 1930    ----------------------------------------------------------------------------------------------------------------   Imaging Results:    Dg Chest 2 View  11/01/2015  CLINICAL DATA:  Epigastric abdominal pain beginning this morning. Nausea and vomiting. History of hypertension. EXAM: CHEST  2 VIEW COMPARISON:  06/20/2014 FINDINGS: Cardiac silhouette is normal in size. No mediastinal or hilar masses or evidence of adenopathy. Clear lungs.  No pleural effusion or pneumothorax. Bony thorax is demineralized but intact. IMPRESSION: No acute cardiopulmonary disease. Electronically Signed   By: Lajean Manes M.D.   On: 11/01/2015 20:23   Ct Abdomen Pelvis W Contrast  11/01/2015  CLINICAL DATA:  Patient with n/v/d and all over abd pain since this am/ hx appendectomy, hiatal hernia, SBO, HTN, hystectomy EXAM: CT ABDOMEN AND PELVIS WITH CONTRAST TECHNIQUE: Multidetector CT imaging of the abdomen and pelvis was performed using the standard protocol following bolus administration of intravenous contrast. CONTRAST:  131mL ISOVUE-300 IOPAMIDOL (ISOVUE-300) INJECTION 61% COMPARISON:  03/24/2015 FINDINGS: Lung bases: Mild  subsegmental atelectasis. Heart normal in size. Small posterior medial left diaphragmatic hernia containing a portion of the gastric fundus, stable. Hepatobiliary: Fatty infiltration of the liver. Liver otherwise unremarkable. Normal gallbladder. No bile duct dilation. Spleen, pancreas, adrenal glands:  Unremarkable. Kidneys, ureters, bladder: No renal masses or stones. No hydronephrosis. Ureters normal in course and in caliber. Bladder is unremarkable. Uterus and adnexa:  Uterus surgically absent.  No pelvic masses. Lymph nodes:  No adenopathy. Ascites:  None. Gastrointestinal: There is mild dilation of loops of small bowel, from the mid to distal jejunum to the distal ileum. An apparent transition is seen in the right lower quadrant. The remaining distal ileum is normal in caliber. No colonic dilation. There is no bowel wall thickening or mesenteric inflammation. Scattered diverticula are noted along the colon without diverticulitis. Musculoskeletal: Degenerative changes of the visualized spine most evident at L5-S1. No osteoblastic or osteolytic lesions. IMPRESSION: 1. Mild dilation of small bowel consistent with a low grade partial small bowel obstruction. Apparent transition point is in the right lower quadrant. 2. No other acute findings. Electronically Signed   By: Lajean Manes M.D.   On: 11/01/2015 20:55       Assessment & Plan:    Active Problems:   Partial small bowel obstruction (HCC)   Nausea with vomiting   Hypertension   AP (abdominal pain)     1. Partial small bowel obstruction- we'll admit the patient under observation to medical surgical unit. General surgery was consulted by the ED physician, and they will follow the patient in a.m. We will keep the patient nothing by mouth, Zofran when necessary for nausea and vomiting. Morphine when necessary for pain. Will obtain abdominal x-ray in a.m. 2. Hypertension- hold oral and hypertensive medications, start hydralazine 10 mg IV every 4  hours when necessary for BP greater than 160/100.    DVT Prophylaxis-   Lovenox   AM Labs Ordered, also please review Full Orders  Family Communication: No family at bedside,    plan of care including tests being ordered have been discussed with the patient  who indicate understanding and agree with the plan and Code Status.  Code Status:  DNR  Admission status: Inpatient  Time spent in minutes :  55 min  Eleonore Chiquito S M.D on 11/01/2015 at 9:29 PM  Between 7am to 7pm - Pager - [phone]. After 7pm go to [email or web address] - password [facility]  [facility] - Office  [phone]

## 2015-11-01 NOTE — ED Notes (Signed)
Patient complaining of abdominal pain and vomiting starting this morning.  

## 2015-11-01 NOTE — ED Provider Notes (Signed)
CSN: [id number]     Arrival date & time 11/01/15  1852 History   First MD Initiated Contact with Patient 11/01/15 1904     Chief Complaint  Patient presents with  . Abdominal Pain  . Emesis      HPI Pt was seen at 1905.  Per pt, c/o gradual onset and worsening of constant generalized abd "pain" since this morning.  Has been associated with multiple intermittent episodes of N/V.  Describes the abd pain as "aching" and "like the last time I had a bowel obstruction." Last BM this morning, but states she has not passed flatus since then. Denies diarrhea, no fevers, no back pain, no rash, no CP/SOB, no black or blood in stools or emesis.      Past Medical History  Diagnosis Date  . H/O hiatal hernia   . Hypertension   . Hyperlipidemia   . Bowel obstruction [facility])    Past Surgical History  Procedure Laterality Date  . Appendectomy    . Abdominal hysterectomy    . Colonoscopy  03/18/2012    Procedure: COLONOSCOPY;  Surgeon: Rogene Houston, MD;  Location[facility];  Service: Endoscopy;  Laterality: N/A;  930  . Laparotomy N/A 02/14/2013    Procedure: EXPLORATORY LAPAROTOMY;  Surgeon: Jamesetta So, MD;  Location: [facility] ORS;  Service: General;  Laterality: N/A;   Family History  Problem Relation Age of Onset  . Cancer     Social History  Substance Use Topics  . Smoking status: Never Smoker   . Smokeless tobacco: None  . Alcohol Use: No    Review of Systems ROS: Statement: All systems negative except as marked or noted in the HPI; Constitutional: Negative for fever and chills. ; ; Eyes: Negative for eye pain, redness and discharge. ; ; ENMT: Negative for ear pain, hoarseness, nasal congestion, sinus pressure and sore throat. ; ; Cardiovascular: Negative for chest pain, palpitations, diaphoresis, dyspnea and peripheral edema. ; ; Respiratory: Negative for cough, wheezing and stridor. ; ; Gastrointestinal: +N/V, abd pain. Negative for diarrhea, blood in stool, hematemesis, jaundice and  rectal bleeding. . ; ; Genitourinary: Negative for dysuria, flank pain and hematuria. ; ; Musculoskeletal: Negative for back pain and neck pain. Negative for swelling and trauma.; ; Skin: Negative for pruritus, rash, abrasions, blisters, bruising and skin lesion.; ; Neuro: Negative for headache, lightheadedness and neck stiffness. Negative for weakness, altered level of consciousness, altered mental status, extremity weakness, paresthesias, involuntary movement, seizure and syncope.      Allergies  Review of patient's allergies indicates no known allergies.  Home Medications   Prior to Admission medications   Medication Sig Start Date End Date Taking? Authorizing Provider  amLODipine (NORVASC) 5 MG tablet Take 5 mg by mouth daily.    Historical Provider, MD  aspirin EC 81 MG tablet Take 81 mg by mouth daily.    Historical Provider, MD  beta carotene w/minerals (OCUVITE) tablet Take 1 tablet by mouth daily.    Historical Provider, MD  Calcium Carbonate-Vitamin D (CALCIUM 600 + D PO) Take 1 tablet by mouth 2 (two) times daily.    Historical Provider, MD  cholecalciferol (VITAMIN D) 1000 UNITS tablet Take 1,000 Units by mouth daily.    Historical Provider, MD  docusate sodium 100 MG CAPS Take 100 mg by mouth daily. Patient taking differently: Take 100 mg by mouth 2 (two) times daily.  10/03/13   Felicie Morn, MD  Fish Oil-Cholecalciferol (FISH OIL + D3) 1000-1000  MG-UNIT CAPS Take 1 capsule by mouth 2 (two) times daily.    Historical Provider, MD  Lactobacillus-Inulin ([place] PO) Take 1 capsule by mouth daily.     Historical Provider, MD  losartan (COZAAR) 50 MG tablet Take 50 mg by mouth daily.    Historical Provider, MD  Multiple Vitamin (MULTIVITAMIN WITH MINERALS) TABS Take 1 tablet by mouth daily.    Historical Provider, MD  omeprazole (PRILOSEC) 40 MG capsule Take 40 mg by mouth daily.    Historical Provider, MD  Potassium 99 MG TABS Take 1 tablet by mouth daily.     Historical Provider, MD  psyllium (METAMUCIL) 58.6 % packet Take 1 packet by mouth daily.    Historical Provider, MD  simvastatin (ZOCOR) 20 MG tablet Take 20 mg by mouth daily.    Historical Provider, MD   BP 155/99 mmHg  Pulse 76  Temp(Src) 98.4 F (36.9 C) (Oral)  Resp 18  Ht 5\' 6"  (1.676 m)  Wt 196 lb (88.905 kg)  BMI 31.65 kg/m2  SpO2 97%  LMP 09/10/2014 (Exact Date) Physical Exam  1910: Physical examination:  Nursing notes reviewed; Vital signs and O2 SAT reviewed;  Constitutional: Well developed, Well nourished, Well hydrated, Uncomfortable appearing.; Head:  Normocephalic, atraumatic; Eyes: EOMI, PERRL, No scleral icterus; ENMT: Mouth and pharynx normal, Mucous membranes moist; Neck: Supple, Full range of motion, No lymphadenopathy; Cardiovascular: Regular rate and rhythm, No gallop; Respiratory: Breath sounds clear & equal bilaterally, No wheezes.  Speaking full sentences with ease, Normal respiratory effort/excursion; Chest: Nontender, Movement normal; Abdomen: Soft, +diffuse tenderness to palp. No rebound or guarding. Nondistended, Normal bowel sounds; Genitourinary: No CVA tenderness; Extremities: Pulses normal, No tenderness, No edema, No calf edema or asymmetry.; Neuro: AA&Ox3, Major CN grossly intact.  Speech clear. No gross focal motor or sensory deficits in extremities. Climbs on and off stretcher easily by herself. Gait steady.; Skin: Color normal, Warm, Dry.   ED Course  Procedures (including critical care time) Labs Review  Imaging Review  I have personally reviewed and evaluated these images and lab results as part of my medical decision-making.   EKG Interpretation None      MDM  MDM Reviewed: vitals, previous chart and nursing note Reviewed previous: labs Interpretation: labs, x-ray and CT scan    Results for orders placed or performed during the hospital encounter of 11/01/15  Comprehensive metabolic panel  Result Value Ref Range   Sodium 139 135 -  145 mmol/L   Potassium 3.5 3.5 - 5.1 mmol/L   Chloride 104 101 - 111 mmol/L   CO2 27 22 - 32 mmol/L   Glucose, Bld 107 (H) 65 - 99 mg/dL   BUN 11 6 - 20 mg/dL   Creatinine, Ser 0.67 0.44 - 1.00 mg/dL   Calcium 9.3 8.9 - 10.3 mg/dL   Total Protein 7.7 6.5 - 8.1 g/dL   Albumin 4.4 3.5 - 5.0 g/dL   AST 68 (H) 15 - 41 U/L   ALT 81 (H) 14 - 54 U/L   Alkaline Phosphatase 70 38 - 126 U/L   Total Bilirubin 0.8 0.3 - 1.2 mg/dL   GFR calc non Af Amer >60 >60 mL/min   GFR calc Af Amer >60 >60 mL/min   Anion gap 8 5 - 15  Lipase, blood  Result Value Ref Range   Lipase 23 11 - 51 U/L  CBC with Differential  Result Value Ref Range   WBC 13.1 (H) 4.0 - 10.5 K/uL  RBC 4.78 3.87 - 5.11 MIL/uL   Hemoglobin 15.0 12.0 - 15.0 g/dL   HCT 44.9 36.0 - 46.0 %   MCV 93.9 78.0 - 100.0 fL   MCH 31.4 26.0 - 34.0 pg   MCHC 33.4 30.0 - 36.0 g/dL   RDW 12.4 11.5 - 15.5 %   Platelets 276 150 - 400 K/uL   Neutrophils Relative % 72 %   Neutro Abs 9.4 (H) 1.7 - 7.7 K/uL   Lymphocytes Relative 18 %   Lymphs Abs 2.4 0.7 - 4.0 K/uL   Monocytes Relative 9 %   Monocytes Absolute 1.1 (H) 0.1 - 1.0 K/uL   Eosinophils Relative 1 %   Eosinophils Absolute 0.2 0.0 - 0.7 K/uL   Basophils Relative 0 %   Basophils Absolute 0.1 0.0 - 0.1 K/uL  Urinalysis, Routine w reflex microscopic  Result Value Ref Range   Color, Urine YELLOW YELLOW   APPearance CLEAR CLEAR   Specific Gravity, Urine 1.025 1.005 - 1.030   pH 6.0 5.0 - 8.0   Glucose, UA NEGATIVE NEGATIVE mg/dL   Hgb urine dipstick NEGATIVE NEGATIVE   Bilirubin Urine NEGATIVE NEGATIVE   Ketones, ur TRACE (A) NEGATIVE mg/dL   Protein, ur TRACE (A) NEGATIVE mg/dL   Nitrite NEGATIVE NEGATIVE   Leukocytes, UA SMALL (A) NEGATIVE  Urine microscopic-add on  Result Value Ref Range   Squamous Epithelial / LPF 0-5 (A) NONE SEEN   WBC, UA 6-30 0 - 5 WBC/hpf   RBC / HPF NONE SEEN 0 - 5 RBC/hpf   Bacteria, UA FEW (A) NONE SEEN   Dg Chest 2 View 11/01/2015  CLINICAL  DATA:  Epigastric abdominal pain beginning this morning. Nausea and vomiting. History of hypertension. EXAM: CHEST  2 VIEW COMPARISON:  06/20/2014 FINDINGS: Cardiac silhouette is normal in size. No mediastinal or hilar masses or evidence of adenopathy. Clear lungs.  No pleural effusion or pneumothorax. Bony thorax is demineralized but intact. IMPRESSION: No acute cardiopulmonary disease. Electronically Signed   By: Lajean Manes M.D.   On: 11/01/2015 20:23   Ct Abdomen Pelvis W Contrast 11/01/2015  CLINICAL DATA:  Patient with n/v/d and all over abd pain since this am/ hx appendectomy, hiatal hernia, SBO, HTN, hystectomy EXAM: CT ABDOMEN AND PELVIS WITH CONTRAST TECHNIQUE: Multidetector CT imaging of the abdomen and pelvis was performed using the standard protocol following bolus administration of intravenous contrast. CONTRAST:  169mL ISOVUE-300 IOPAMIDOL (ISOVUE-300) INJECTION 61% COMPARISON:  03/24/2015 FINDINGS: Lung bases: Mild subsegmental atelectasis. Heart normal in size. Small posterior medial left diaphragmatic hernia containing a portion of the gastric fundus, stable. Hepatobiliary: Fatty infiltration of the liver. Liver otherwise unremarkable. Normal gallbladder. No bile duct dilation. Spleen, pancreas, adrenal glands:  Unremarkable. Kidneys, ureters, bladder: No renal masses or stones. No hydronephrosis. Ureters normal in course and in caliber. Bladder is unremarkable. Uterus and adnexa:  Uterus surgically absent.  No pelvic masses. Lymph nodes:  No adenopathy. Ascites:  None. Gastrointestinal: There is mild dilation of loops of small bowel, from the mid to distal jejunum to the distal ileum. An apparent transition is seen in the right lower quadrant. The remaining distal ileum is normal in caliber. No colonic dilation. There is no bowel wall thickening or mesenteric inflammation. Scattered diverticula are noted along the colon without diverticulitis. Musculoskeletal: Degenerative changes of the  visualized spine most evident at L5-S1. No osteoblastic or osteolytic lesions. IMPRESSION: 1. Mild dilation of small bowel consistent with a low grade partial small bowel obstruction. Apparent  transition point is in the right lower quadrant. 2. No other acute findings. Electronically Signed   By: Lajean Manes M.D.   On: 11/01/2015 20:55    Results for ERISHA, MARTINELL (MRN [id number]) as of 11/01/2015 20:27  Ref. Range 10/01/2013 11:03 06/20/2014 01:00 03/24/2015 13:50 11/01/2015 19:20  AST Latest Ref Range: 15-41 U/L 37 41 (H) 56 (H) 68 (H)  ALT Latest Ref Range: 14-54 U/L 46 (H) 50 (H) 58 (H) 81 (H)    2100:  LFT's mildly elevated, c/w previous. No clear UTI on Udip; UC pending. No N/V while in the ED after IV zofran. Dx and testing d/w pt and family.  Questions answered.  Verb understanding, agreeable to admit. T/C to General Surgery Dr. Rosana Hoes, case discussed, including:  HPI, pertinent PM/SHx, VS/PE, dx testing, ED course and treatment:  Agreeable to consult. T/C to [facility] Dr. Darrick Meigs, case discussed, including:  HPI, pertinent PM/SHx, VS/PE, dx testing, ED course and treatment:  Agreeable to admit, requests to write temporary orders, obtain observation medical bed to team [facility].    Francine Graven, DO 11/02/15 2140

## 2015-11-02 ENCOUNTER — Observation Stay (HOSPITAL_COMMUNITY): Payer: Medicare Other

## 2015-11-02 DIAGNOSIS — Z66 Do not resuscitate: Secondary | ICD-10-CM | POA: Diagnosis present

## 2015-11-02 DIAGNOSIS — E669 Obesity, unspecified: Secondary | ICD-10-CM | POA: Diagnosis present

## 2015-11-02 DIAGNOSIS — K5660 Unspecified intestinal obstruction: Secondary | ICD-10-CM | POA: Diagnosis not present

## 2015-11-02 DIAGNOSIS — R112 Nausea with vomiting, unspecified: Secondary | ICD-10-CM

## 2015-11-02 DIAGNOSIS — I10 Essential (primary) hypertension: Secondary | ICD-10-CM | POA: Diagnosis present

## 2015-11-02 DIAGNOSIS — Z79899 Other long term (current) drug therapy: Secondary | ICD-10-CM | POA: Diagnosis not present

## 2015-11-02 DIAGNOSIS — R1013 Epigastric pain: Secondary | ICD-10-CM | POA: Diagnosis not present

## 2015-11-02 DIAGNOSIS — Z7982 Long term (current) use of aspirin: Secondary | ICD-10-CM | POA: Diagnosis not present

## 2015-11-02 DIAGNOSIS — K5669 Other intestinal obstruction: Principal | ICD-10-CM

## 2015-11-02 DIAGNOSIS — G43A1 Cyclical vomiting, intractable: Secondary | ICD-10-CM | POA: Diagnosis not present

## 2015-11-02 DIAGNOSIS — Z6833 Body mass index (BMI) 33.0-33.9, adult: Secondary | ICD-10-CM | POA: Diagnosis not present

## 2015-11-02 DIAGNOSIS — E785 Hyperlipidemia, unspecified: Secondary | ICD-10-CM | POA: Diagnosis present

## 2015-11-02 LAB — COMPREHENSIVE METABOLIC PANEL
ALT: 60 U/L — AB (ref 14–54)
AST: 45 U/L — AB (ref 15–41)
Albumin: 3.5 g/dL (ref 3.5–5.0)
Alkaline Phosphatase: 56 U/L (ref 38–126)
Anion gap: 6 (ref 5–15)
BUN: 10 mg/dL (ref 6–20)
CHLORIDE: 106 mmol/L (ref 101–111)
CO2: 25 mmol/L (ref 22–32)
CREATININE: 0.49 mg/dL (ref 0.44–1.00)
Calcium: 8.4 mg/dL — ABNORMAL LOW (ref 8.9–10.3)
GFR calc Af Amer: >60 mL/min (ref >60)
GFR calc non Af Amer: >60 mL/min (ref >60)
GLUCOSE: 137 mg/dL — AB (ref 65–99)
Potassium: 3.6 mmol/L (ref 3.5–5.1)
SODIUM: 137 mmol/L (ref 135–145)
Total Bilirubin: 0.9 mg/dL (ref 0.3–1.2)
Total Protein: 6.4 g/dL — ABNORMAL LOW (ref 6.5–8.1)

## 2015-11-02 LAB — CBC
HCT: 39.8 % (ref 36.0–46.0)
HEMOGLOBIN: 13.2 g/dL (ref 12.0–15.0)
MCH: 31.4 pg (ref 26.0–34.0)
MCHC: 33.2 g/dL (ref 30.0–36.0)
MCV: 94.5 fL (ref 78.0–100.0)
PLATELETS: 259 10*3/uL (ref 150–400)
RBC: 4.21 MIL/uL (ref 3.87–5.11)
RDW: 12.5 % (ref 11.5–15.5)
WBC: 12.7 10*3/uL — AB (ref 4.0–10.5)

## 2015-11-02 NOTE — Progress Notes (Signed)
PROGRESS NOTE    [name] [name]  [id number] DOB: 1945-10-05 DOA: 11/01/2015 PCP: Purvis Kilts, MD     Brief Narrative:  70 year old woman admitted on 6/1 with abdominal pain and nausea. She has a history of small bowel obstruction and presented with the same. Was admitted for further evaluation and management. On 6/2, had a small bowel movement, pain is improved, will start to advance diet.   Assessment & Plan:   Active Problems:   Partial small bowel obstruction (HCC)   Nausea with vomiting   Hypertension   AP (abdominal pain)   Partial small bowel obstruction versus focal ileus -Patient's abdominal pain is improved, she has passed flatus and had a small bowel movement last night. -We'll elect to start patient on clear liquids and advance diet as tolerated.   DVT prophylaxis: Lovenox Code Status: DO NOT RESUSCITATE Family Communication: Patient only Disposition Plan: Likely home in 24-48 hours  Consultants:   Gen. surgery  Procedures:   None  Antimicrobials:   None    Subjective: Feels improved, less abdominal distention  Objective: Filed Vitals:   11/01/15 2115 11/01/15 2130 11/01/15 2200 11/01/15 2247  BP:  126/86 136/81 150/93  Pulse:  79 76 79  Temp:    98.4 F (36.9 C)  TempSrc:    Oral  Resp:    20  Height:    5\' 4"  (1.626 m)  Weight:    88.905 kg (196 lb)  SpO2: 95%   96%    Intake/Output Summary (Last 24 hours) at 11/02/15 1512 Last data filed at 11/02/15 0700  Gross per 24 hour  Intake 1146.67 ml  Output      0 ml  Net 1146.67 ml   Filed Weights   11/01/15 1902 11/01/15 2247  Weight: 88.905 kg (196 lb) 88.905 kg (196 lb)    Examination:  General exam: Alert, awake, oriented x 3 Respiratory system: Clear to auscultation. Respiratory effort normal. Cardiovascular system:RRR. No murmurs, rubs, gallops. Gastrointestinal system: Abdomen is nondistended, soft and nontender. No organomegaly or masses felt. Normal bowel  sounds heard. Central nervous system: Alert and oriented. No focal neurological deficits. Extremities: No C/C/E, +pedal pulses Skin: No rashes, lesions or ulcers Psychiatry: Judgement and insight appear normal. Mood & affect appropriate.     Data Reviewed: I have personally reviewed following labs and imaging studies  CBC:  Recent Labs Lab 11/01/15 1920 11/02/15 0551  WBC 13.1* 12.7*  NEUTROABS 9.4*  --   HGB 15.0 13.2  HCT 44.9 39.8  MCV 93.9 94.5  PLT 276 [id number]   Basic Metabolic Panel:  Recent Labs Lab 11/01/15 1920 11/02/15 0551  NA 139 137  K 3.5 3.6  CL 104 106  CO2 27 25  GLUCOSE 107* 137*  BUN 11 10  CREATININE 0.67 0.49  CALCIUM 9.3 8.4*   GFR: Estimated Creatinine Clearance: 72.7 mL/min (by C-G formula based on Cr of 0.49). Liver Function Tests:  Recent Labs Lab 11/01/15 1920 11/02/15 0551  AST 68* 45*  ALT 81* 60*  ALKPHOS 70 56  BILITOT 0.8 0.9  PROT 7.7 6.4*  ALBUMIN 4.4 3.5    Recent Labs Lab 11/01/15 1920  LIPASE 23   No results for input(s): AMMONIA in the last 168 hours. Coagulation Profile: No results for input(s): INR, PROTIME in the last 168 hours. Cardiac Enzymes: No results for input(s): CKTOTAL, CKMB, CKMBINDEX, TROPONINI in the last 168 hours. BNP (last 3 results) No results for input(s): PROBNP in the last  8760 hours. HbA1C: No results for input(s): HGBA1C in the last 72 hours. CBG: No results for input(s): GLUCAP in the last 168 hours. Lipid Profile: No results for input(s): CHOL, HDL, LDLCALC, TRIG, CHOLHDL, LDLDIRECT in the last 72 hours. Thyroid Function Tests: No results for input(s): TSH, T4TOTAL, FREET4, T3FREE, THYROIDAB in the last 72 hours. Anemia Panel: No results for input(s): VITAMINB12, FOLATE, FERRITIN, TIBC, IRON, RETICCTPCT in the last 72 hours. Urine analysis:    Component Value Date/Time   COLORURINE YELLOW 11/01/2015 1930   APPEARANCEUR CLEAR 11/01/2015 1930   LABSPEC 1.025 11/01/2015 1930    PHURINE 6.0 11/01/2015 1930   GLUCOSEU NEGATIVE 11/01/2015 1930   HGBUR NEGATIVE 11/01/2015 1930   BILIRUBINUR NEGATIVE 11/01/2015 1930   KETONESUR TRACE* 11/01/2015 1930   PROTEINUR TRACE* 11/01/2015 1930   UROBILINOGEN 0.2 03/24/2015 2055   NITRITE NEGATIVE 11/01/2015 1930   LEUKOCYTESUR SMALL* 11/01/2015 1930   Sepsis Labs: @LABRCNTIP (procalcitonin:4,lacticidven:4)  )No results found for this or any previous visit (from the past 240 hour(s)).       Radiology Studies: Dg Chest 2 View  11/01/2015  CLINICAL DATA:  Epigastric abdominal pain beginning this morning. Nausea and vomiting. History of hypertension. EXAM: CHEST  2 VIEW COMPARISON:  06/20/2014 FINDINGS: Cardiac silhouette is normal in size. No mediastinal or hilar masses or evidence of adenopathy. Clear lungs.  No pleural effusion or pneumothorax. Bony thorax is demineralized but intact. IMPRESSION: No acute cardiopulmonary disease. Electronically Signed   By: Lajean Manes M.D.   On: 11/01/2015 20:23   Ct Abdomen Pelvis W Contrast  11/01/2015  CLINICAL DATA:  Patient with n/v/d and all over abd pain since this am/ hx appendectomy, hiatal hernia, SBO, HTN, hystectomy EXAM: CT ABDOMEN AND PELVIS WITH CONTRAST TECHNIQUE: Multidetector CT imaging of the abdomen and pelvis was performed using the standard protocol following bolus administration of intravenous contrast. CONTRAST:  165mL ISOVUE-300 IOPAMIDOL (ISOVUE-300) INJECTION 61% COMPARISON:  03/24/2015 FINDINGS: Lung bases: Mild subsegmental atelectasis. Heart normal in size. Small posterior medial left diaphragmatic hernia containing a portion of the gastric fundus, stable. Hepatobiliary: Fatty infiltration of the liver. Liver otherwise unremarkable. Normal gallbladder. No bile duct dilation. Spleen, pancreas, adrenal glands:  Unremarkable. Kidneys, ureters, bladder: No renal masses or stones. No hydronephrosis. Ureters normal in course and in caliber. Bladder is unremarkable. Uterus  and adnexa:  Uterus surgically absent.  No pelvic masses. Lymph nodes:  No adenopathy. Ascites:  None. Gastrointestinal: There is mild dilation of loops of small bowel, from the mid to distal jejunum to the distal ileum. An apparent transition is seen in the right lower quadrant. The remaining distal ileum is normal in caliber. No colonic dilation. There is no bowel wall thickening or mesenteric inflammation. Scattered diverticula are noted along the colon without diverticulitis. Musculoskeletal: Degenerative changes of the visualized spine most evident at L5-S1. No osteoblastic or osteolytic lesions. IMPRESSION: 1. Mild dilation of small bowel consistent with a low grade partial small bowel obstruction. Apparent transition point is in the right lower quadrant. 2. No other acute findings. Electronically Signed   By: Lajean Manes M.D.   On: 11/01/2015 20:55   Dg Abd 2 Views  11/02/2015  CLINICAL DATA:  Small bowel obstruction. EXAM: ABDOMEN - 2 VIEW COMPARISON:  10/02/2013 and CT 11/01/2015 FINDINGS: Examination demonstrates air and stool throughout the colon with a few scattered air-fluid levels over the right colon. There is a mildly dilated air-filled small bowel loop over the left mid abdomen measuring up to 3.8  cm in diameter. No free peritoneal air. There are mild degenerate changes of the spine. Contrast is present within the bladder secondary to recent CT scan. IMPRESSION: Mildly dilated air-filled small bowel loop over the left mid abdomen which may be due to focal ileus versus early/partial small bowel obstruction. Electronically Signed   By: Marin Olp M.D.   On: 11/02/2015 12:54        Scheduled Meds: . enoxaparin (LOVENOX) injection  40 mg Subcutaneous Q24H   Continuous Infusions: . sodium chloride 100 mL/hr at 11/01/15 1932        Time spent: 25 minutes. Greater than 50% of this time was spent in direct contact with the patient coordinating care.     Lelon Frohlich,  MD [facility] Pager [phone]  If 7PM-7AM, please contact night-coverage [email or web address] Password [email or web address] 11/02/2015, 3:12 PM

## 2015-11-02 NOTE — Care Management Note (Signed)
Case Management Note  Patient Details  Name: [name] [name] MRN: [id number] Date of Birth: [birth year]  Subjective/Objective: Patient is from home alone. She is independent with ADL's. She reports being an avid gardener and is ready to get home. She reports being able to drive herself to appointments and can afford her medications.                  Action/Plan: Anticipated no CM needs, unless plan of care changes. Will cont. To follow until discharge.   Expected Discharge Date:       11/04/2015           Expected Discharge Plan:  Home/Self Care  In-House Referral:     Discharge planning Services  CM Consult  Post Acute Care Choice:  NA Choice offered to:  NA  DME Arranged:    DME Agency:     HH Arranged:    HH Agency:     Status of Service:  In process, will continue to follow  Medicare Important Message Given:    Date Medicare IM Given:    Medicare IM give by:    Date Additional Medicare IM Given:    Additional Medicare Important Message give by:     If discussed at [place] of Stay Meetings, dates discussed:    Additional Comments:  Mustaf Antonacci, Chauncey Reading, RN 11/02/2015, 12:12 PM

## 2015-11-02 NOTE — Consult Note (Signed)
SURGICAL CONSULTATION NOTE (initial)  HISTORY OF PRESENT ILLNESS (HPI):  70 y.o. female presented with gradual onset of generalized abdominal pain and nausea that she describes as similar to the last time she had a bowel obstruction (5 previous bowel obstructions, only once previously requiring surgery). At time of presentation, she'd had a bowel movement the morning prior, but had not passed flatus since that time. CT imaging confirmed diagnosis of SBO, since which time patient reports she's been feeling better and today began passing flatus with nearly resolved pain and no nausea.   PAST MEDICAL HISTORY (PMH):  Past Medical History  Diagnosis Date  . H/O hiatal hernia   . Hypertension   . Hyperlipidemia   . Bowel obstruction ([place])      PAST SURGICAL HISTORY ([place]):  Past Surgical History  Procedure Laterality Date  . Appendectomy    . Abdominal hysterectomy    . Colonoscopy  03/18/2012    Procedure: COLONOSCOPY;  Surgeon: Rogene Houston, MD;  Location: [facility];  Service: Endoscopy;  Laterality: N/A;  930  . Laparotomy N/A 02/14/2013    Procedure: EXPLORATORY LAPAROTOMY;  Surgeon: Jamesetta So, MD;  Location: [facility] ORS;  Service: General;  Laterality: N/A;     MEDICATIONS:  Prior to Admission medications   Medication Sig Start Date End Date Taking? Authorizing Provider  amLODipine (NORVASC) 5 MG tablet Take 5 mg by mouth daily.   Yes Historical Provider, MD  aspirin EC 81 MG tablet Take 81 mg by mouth daily.   Yes Historical Provider, MD  atorvastatin (LIPITOR) 10 MG tablet Take 10 mg by mouth daily. 09/17/15  Yes Historical Provider, MD  beta carotene w/minerals (OCUVITE) tablet Take 1 tablet by mouth daily.   Yes Historical Provider, MD  Calcium Carbonate-Vitamin D (CALCIUM 600 + D PO) Take 1 tablet by mouth 2 (two) times daily.   Yes Historical Provider, MD  cholecalciferol (VITAMIN D) 1000 UNITS tablet Take 5,000 Units by mouth daily.    Yes Historical Provider, MD   docusate sodium 100 MG CAPS Take 100 mg by mouth daily. Patient taking differently: Take 100 mg by mouth 2 (two) times daily.  10/03/13  Yes Felicie Morn, MD  Fish Oil-Cholecalciferol (FISH OIL + D3) 1000-1000 MG-UNIT CAPS Take 1 capsule by mouth 2 (two) times daily.   Yes Historical Provider, MD  Lactobacillus-Inulin ([place] PO) Take 1 capsule by mouth daily.    Yes Historical Provider, MD  losartan (COZAAR) 50 MG tablet Take 50 mg by mouth daily.   Yes Historical Provider, MD  Multiple Vitamin (MULTIVITAMIN WITH MINERALS) TABS Take 1 tablet by mouth daily.   Yes Historical Provider, MD  Multiple Vitamins-Minerals (EYE VITAMINS PO) Take by mouth. [place] Vitamin   Yes Historical Provider, MD  omeprazole (PRILOSEC) 40 MG capsule Take 40 mg by mouth daily.   Yes Historical Provider, MD  polyethylene glycol powder (GLYCOLAX/MIRALAX) powder Take 17 g by mouth daily.   Yes Historical Provider, MD  Potassium 99 MG TABS Take 1 tablet by mouth daily.   Yes Historical Provider, MD     ALLERGIES:  No Known Allergies   SOCIAL HISTORY:  Social History   Social History  . Marital Status: Single    Spouse Name: N/A  . Number of Children: N/A  . Years of Education: N/A   Occupational History  . Not on file.   Social History Main Topics  . Smoking status: Never Smoker   . Smokeless  tobacco: Not on file  . Alcohol Use: No  . Drug Use: No  . Sexual Activity: No   Other Topics Concern  . Not on file   Social History Narrative    The patient currently resides (home / rehab facility / nursing home): Home  The patient normally is (ambulatory / bedbound): Ambulatory   FAMILY HISTORY:  Family History  Problem Relation Age of Onset  . Cancer       REVIEW OF SYSTEMS:  Constitutional: denies weight loss, fever, chills, or sweats  Eyes: denies any other vision changes, history of eye injury  ENT: denies sore throat, hearing problems  Respiratory: denies  shortness of breath, wheezing  Cardiovascular: denies chest pain, palpitations  Gastrointestinal: currently denies abdominal pain, N/V, or diarrhea, +flatus as per HPI Musculoskeletal: denies any other joint pains or cramps  Skin: denies any other rashes or skin discolorations  Neurological: denies any other headache, dizziness, weakness  Psychiatric: denies any other depression, anxiety   All other review of systems were negative   VITAL SIGNS:  Temp:  [98.4 F (36.9 C)] 98.4 F (36.9 C) 28-Nov-2022 2247) Pulse Rate:  [68-79] 79 11/28/22 2247) Resp:  [18-20] 20 11-28-2022 2247) BP: (126-159)/(79-99) 150/93 mmHg Nov 28, 2022 2247) SpO2:  [95 %-99 %] 96 % 11-28-22 2247) Weight:  [88.905 kg (196 lb)] 88.905 kg (196 lb) 11/28/2022 2247)     Height: 5\' 4"  (162.6 cm) Weight: 88.905 kg (196 lb) BMI (Calculated): 33.7   INTAKE/OUTPUT:  This shift:    Last 2 shifts: @IOLAST2SHIFTS @   PHYSICAL EXAM:  Constitutional:  -- Obese, sitting in chair  -- Awake, alert, and oriented x3  Eyes:  -- Pupils equally round and reactive to light  -- No scleral icterus  Ear, nose, and throat:  -- No jugular venous distension  Pulmonary:  -- No crackles  -- Equal breath sounds bilaterally  Cardiovascular:  -- S1, S2 present  -- No pericardial rubs Abdomen:  -- Soft, obese, nontender, nondistended, no guarding/rebound -- No abdominal masses appreciated, pulsatile or otherwise -- Well-healed RLQ, lower midline, and transverse pelvic incisional scars Musculoskeletal / Integumentary:  -- Wounds or skin discoloration: None  -- Extremities: B/L UE and LE FROM, hands and feet warm Neurologic:  -- Motor function: intact and symmetric -- Sensation: intact and symmetric   Labs:  CBC:  Lab Results  Component Value Date   WBC 12.7* 11/02/2015   RBC 4.21 11/02/2015   BMP:  Lab Results  Component Value Date   GLUCOSE 137* 11/02/2015   CO2 25 11/02/2015   BUN 10 11/02/2015   CREATININE 0.49 11/02/2015   CALCIUM 8.4*  11/02/2015     Imaging studies:  CT Abdomen and Pelvis (11/28/15) There is mild dilation of loops of small bowel, from the mid to distal jejunum to the distal ileum. An apparent transition is seen in the right lower quadrant. The remaining distal ileum is normal in caliber. No colonic dilation. There is no bowel wall thickening or mesenteric inflammation. Scattered diverticula are noted along the colon without diverticulitis.  Assessment/Plan:  70 y.o. female with small bowel obstruction, complicated by pertinent comorbidities including obesity, hypertension, and hyperlipidemia.   - okay for clear liquids   - will reassess for advancement of diet tomorrow morning   - medical management of comorbidities as per medical team   - ambulation encouraged  All of the above findings and recommendations were discussed with the patient, and all of her questions were answered to her  expressed satisfaction.  Thank you for the opportunity to participate in the care for this patient.   -- Marilynne Drivers Rosana Hoes, [place]: [place] [facility] Office: [phone]

## 2015-11-03 DIAGNOSIS — G43A1 Cyclical vomiting, intractable: Secondary | ICD-10-CM

## 2015-11-03 NOTE — Discharge Summary (Signed)
Physician Discharge Summary  [name] [name] [id number] DOB: [birth year] DOA: 11/01/2015  PCP: Purvis Kilts, MD  Admit date: 11/01/2015 Discharge date: 11/03/2015  Time spent: 45 minutes  Recommendations for Outpatient Follow-up:  -Will be discharged home today. -Advised to follow-up with PCP in 2 weeks.  Discharge Diagnoses:  Active Problems:   Partial small bowel obstruction (HCC)   Nausea with vomiting   Hypertension   AP (abdominal pain)   Discharge Condition: Stable and improved  Filed Weights   11/01/15 1902 11/01/15 2247  Weight: 88.905 kg (196 lb) 88.905 kg (196 lb)    History of present illness:  As per Dr. Darrick Meigs on 6/1: [name] [name] is a 70 y.o. female, With history of small bowel obstruction, hypertension, hyperlipidemia who came to the hospital with gradual onset of worsening generalized abdominal pain, associated with nausea and vomiting. Patient describes pain as aching, had to bowel movements today. She denies diarrhea. No fever or chills. No chest and shortness of breath. Black or bloody stool. In the ED CT scan of the abdomen was done which showed mild dilation of small bowel consistent with low-grade partial small bowel obstruction.  Hospital Course:   Partial small bowel obstruction versus focal ileus -Patient's pain is resolved, she is tolerating a full liquid diet without complaints, has had a small bowel movement. -Okay for discharge home today, advised to advance diet slowly.  Procedures:  None   Consultations:  Surgery  Discharge Instructions  Discharge Instructions    Diet - low sodium heart healthy    Complete by:  As directed      Increase activity slowly    Complete by:  As directed             Medication List    TAKE these medications        amLODipine 5 MG tablet  Commonly known as:  NORVASC  Take 5 mg by mouth daily.     aspirin EC 81 MG tablet  Take 81 mg by mouth daily.     atorvastatin 10 MG  tablet  Commonly known as:  LIPITOR  Take 10 mg by mouth daily.     EYE VITAMINS PO  Take by mouth. [place] Vitamin     beta carotene w/minerals tablet  Take 1 tablet by mouth daily.     CALCIUM 600 + D PO  Take 1 tablet by mouth 2 (two) times daily.     cholecalciferol 1000 units tablet  Commonly known as:  VITAMIN D  Take 5,000 Units by mouth daily.     CULTURELLE DIGESTIVE HEALTH PO  Take 1 capsule by mouth daily.     DSS 100 MG Caps  Take 100 mg by mouth daily.     FISH OIL + D3 1000-1000 MG-UNIT Caps  Take 1 capsule by mouth 2 (two) times daily.     losartan 50 MG tablet  Commonly known as:  COZAAR  Take 50 mg by mouth daily.     multivitamin with minerals Tabs tablet  Take 1 tablet by mouth daily.     omeprazole 40 MG capsule  Commonly known as:  PRILOSEC  Take 40 mg by mouth daily.     polyethylene glycol powder powder  Commonly known as:  GLYCOLAX/MIRALAX  Take 17 g by mouth daily.     Potassium 99 MG Tabs  Take 1 tablet by mouth daily.       No Known Allergies  Follow-up Information    Follow up with Purvis Kilts, MD. Schedule an appointment as soon as possible for a visit in 2 weeks.   Specialty:  Family Medicine   Contact information:   [address] [place] [id number] [phone]        The results of significant diagnostics from this hospitalization (including imaging, microbiology, ancillary and laboratory) are listed below for reference.    Significant Diagnostic Studies: Dg Chest 2 View  11/01/2015  CLINICAL DATA:  Epigastric abdominal pain beginning this morning. Nausea and vomiting. History of hypertension. EXAM: CHEST  2 VIEW COMPARISON:  06/20/2014 FINDINGS: Cardiac silhouette is normal in size. No mediastinal or hilar masses or evidence of adenopathy. Clear lungs.  No pleural effusion or pneumothorax. Bony thorax is demineralized but intact. IMPRESSION: No acute cardiopulmonary disease. Electronically  Signed   By: Lajean Manes M.D.   On: 11/01/2015 20:23   Ct Abdomen Pelvis W Contrast  11/01/2015  CLINICAL DATA:  Patient with n/v/d and all over abd pain since this am/ hx appendectomy, hiatal hernia, SBO, HTN, hystectomy EXAM: CT ABDOMEN AND PELVIS WITH CONTRAST TECHNIQUE: Multidetector CT imaging of the abdomen and pelvis was performed using the standard protocol following bolus administration of intravenous contrast. CONTRAST:  19mL ISOVUE-300 IOPAMIDOL (ISOVUE-300) INJECTION 61% COMPARISON:  03/24/2015 FINDINGS: Lung bases: Mild subsegmental atelectasis. Heart normal in size. Small posterior medial left diaphragmatic hernia containing a portion of the gastric fundus, stable. Hepatobiliary: Fatty infiltration of the liver. Liver otherwise unremarkable. Normal gallbladder. No bile duct dilation. Spleen, pancreas, adrenal glands:  Unremarkable. Kidneys, ureters, bladder: No renal masses or stones. No hydronephrosis. Ureters normal in course and in caliber. Bladder is unremarkable. Uterus and adnexa:  Uterus surgically absent.  No pelvic masses. Lymph nodes:  No adenopathy. Ascites:  None. Gastrointestinal: There is mild dilation of loops of small bowel, from the mid to distal jejunum to the distal ileum. An apparent transition is seen in the right lower quadrant. The remaining distal ileum is normal in caliber. No colonic dilation. There is no bowel wall thickening or mesenteric inflammation. Scattered diverticula are noted along the colon without diverticulitis. Musculoskeletal: Degenerative changes of the visualized spine most evident at L5-S1. No osteoblastic or osteolytic lesions. IMPRESSION: 1. Mild dilation of small bowel consistent with a low grade partial small bowel obstruction. Apparent transition point is in the right lower quadrant. 2. No other acute findings. Electronically Signed   By: Lajean Manes M.D.   On: 11/01/2015 20:55   Dg Abd 2 Views  11/02/2015  CLINICAL DATA:  Small bowel  obstruction. EXAM: ABDOMEN - 2 VIEW COMPARISON:  10/02/2013 and CT 11/01/2015 FINDINGS: Examination demonstrates air and stool throughout the colon with a few scattered air-fluid levels over the right colon. There is a mildly dilated air-filled small bowel loop over the left mid abdomen measuring up to 3.8 cm in diameter. No free peritoneal air. There are mild degenerate changes of the spine. Contrast is present within the bladder secondary to recent CT scan. IMPRESSION: Mildly dilated air-filled small bowel loop over the left mid abdomen which may be due to focal ileus versus early/partial small bowel obstruction. Electronically Signed   By: Marin Olp M.D.   On: 11/02/2015 12:54    Microbiology: No results found for this or any previous visit (from the past 240 hour(s)).   Labs: Basic Metabolic Panel:  Recent Labs Lab 11/01/15 1920 11/02/15 0551  NA 139 137  K 3.5 3.6  CL 104  106  CO2 27 25  GLUCOSE 107* 137*  BUN 11 10  CREATININE 0.67 0.49  CALCIUM 9.3 8.4*   Liver Function Tests:  Recent Labs Lab 11/01/15 1920 11/02/15 0551  AST 68* 45*  ALT 81* 60*  ALKPHOS 70 56  BILITOT 0.8 0.9  PROT 7.7 6.4*  ALBUMIN 4.4 3.5    Recent Labs Lab 11/01/15 1920  LIPASE 23   No results for input(s): AMMONIA in the last 168 hours. CBC:  Recent Labs Lab 11/01/15 1920 11/02/15 0551  WBC 13.1* 12.7*  NEUTROABS 9.4*  --   HGB 15.0 13.2  HCT 44.9 39.8  MCV 93.9 94.5  PLT 276 259   Cardiac Enzymes: No results for input(s): CKTOTAL, CKMB, CKMBINDEX, TROPONINI in the last 168 hours. BNP: BNP (last 3 results) No results for input(s): BNP in the last 8760 hours.  ProBNP (last 3 results) No results for input(s): PROBNP in the last 8760 hours.  CBG: No results for input(s): GLUCAP in the last 168 hours.     SignedLelon Frohlich  [facility] Pager: [phone] 11/03/2015, 11:38 AM

## 2015-11-03 NOTE — Progress Notes (Signed)
[facility] Day(s): 1.   Post op day(s):  Marland Kitchen   Interval History: Patient seen and examined, no acute events or new complaints overnight. Patient reports +flatus, tolerating regular diet this morning for breakfast, denies abdominal pain, nausea, CP, SOB, or fever/chills.  Review of Systems:  Constitutional: denies fever, chills  HEENT: denies cough or congestion  Respiratory: denies any shortness of breath  Cardiovascular: denies chest pain or palpitations  Gastrointestinal: denies N/V, diarrhea or constipation  Musculoskeletal: denies pain, decreased motor or sensation  Neurological: denies HA or vision/hearing changes   Vital signs in last 24 hours: [min-max] current  Temp:  [97.6 F (36.4 C)-98.1 F (36.7 C)] 98 F (36.7 C) (06/03 0536) Pulse Rate:  [63-69] 63 (06/03 0536) Resp:  [18-20] 18 (06/03 0536) BP: (129-151)/(68-102) 151/102 mmHg (06/03 0536) SpO2:  [92 %-98 %] 96 % (06/03 0536)     Height: 5\' 4"  (162.6 cm) Weight: 88.905 kg (196 lb) BMI (Calculated): 33.7   Intake/Output this shift:  Total I/O In: 553.3 [I.V.:553.3] Out: -    Intake/Output last 2 shifts:  @IOLAST2SHIFTS @   Physical Exam:  Constitutional: alert, cooperative and no distress  HENT: normocephalic without obvious abnormality  Eyes: PERRL, EOM's grossly intact and symmetric  Neuro: CN II - XII grossly intact and symmetric without deficit  Respiratory: breathing non-labored at rest  Cardiovascular: regular rate and sinus rhythm  Gastrointestinal: soft, overweight, non-tender, and non-distended  Musculoskeletal: UE and LE FROM, motor and sensation grossly intact, NT    Labs:  CBC:  Lab Results  Component Value Date   WBC 12.7* 11/02/2015   RBC 4.21 11/02/2015   BMP:  Lab Results  Component Value Date   GLUCOSE 137* 11/02/2015   CO2 25 11/02/2015   BUN 10 11/02/2015   CREATININE 0.49 11/02/2015   CALCIUM 8.4* 11/02/2015     Imaging studies: No new pertinent  imaging studies    Assessment/Plan:  70 y.o. female with recurrent small bowel obstruction, complicated by pertinent comorbidities including obesity, hypertension, and hyperlipidemia.   - heart healthy diet as tolerated  - agree with discharge planning, follow-up as needed  - medical management of comorbidities as per medical team  - ambulation encouraged  All of the above findings and recommendations were discussed with the patient, and all of her questions were answered to her expressed satisfaction.  Thank you for the opportunity to participate in the care for this patient.   -- Marilynne Drivers Rosana Hoes, [place]: [place] [facility] Office: [phone]

## 2015-11-03 NOTE — Progress Notes (Signed)
Pt's IV catheter removed and intact. Pt's IV site clean dry and intact. Discharge instructions including medications and follow up appointments were reviewed and discussed with patient. Patient verbalized understanding of discharge instructions including medications and follow up appointments. All questions were answered and no further questions at this time. Pt in stable condition and in no acute distress at time of discharge. Pt will be escorted by nurse tech.

## 2015-11-04 LAB — URINE CULTURE

## 2015-11-06 DIAGNOSIS — K5669 Other intestinal obstruction: Secondary | ICD-10-CM | POA: Diagnosis not present

## 2015-11-06 DIAGNOSIS — Z6832 Body mass index (BMI) 32.0-32.9, adult: Secondary | ICD-10-CM | POA: Diagnosis not present

## 2015-11-12 ENCOUNTER — Ambulatory Visit: Payer: Medicare Other

## 2015-11-23 ENCOUNTER — Ambulatory Visit
Admission: RE | Admit: 2015-11-23 | Discharge: 2015-11-23 | Disposition: A | Discharge status: Home / Self Care | Payer: Medicare Other | Source: Ambulatory Visit

## 2015-11-23 ENCOUNTER — Other Ambulatory Visit: Payer: Self-pay | Admitting: Family Medicine

## 2015-11-23 DIAGNOSIS — Z1231 Encounter for screening mammogram for malignant neoplasm of breast: Secondary | ICD-10-CM | POA: Diagnosis not present

## 2015-12-19 DIAGNOSIS — Z6832 Body mass index (BMI) 32.0-32.9, adult: Secondary | ICD-10-CM | POA: Diagnosis not present

## 2015-12-19 DIAGNOSIS — R7309 Other abnormal glucose: Secondary | ICD-10-CM | POA: Diagnosis not present

## 2015-12-19 DIAGNOSIS — I10 Essential (primary) hypertension: Secondary | ICD-10-CM | POA: Diagnosis not present

## 2015-12-19 DIAGNOSIS — Z1389 Encounter for screening for other disorder: Secondary | ICD-10-CM | POA: Diagnosis not present

## 2015-12-19 DIAGNOSIS — E782 Mixed hyperlipidemia: Secondary | ICD-10-CM | POA: Diagnosis not present

## 2015-12-19 LAB — BASIC METABOLIC PANEL
BUN: 11 (ref 4–21)
CREATININE: 0.7 (ref 0.5–1.1)
Glucose: 94
Potassium: 4.6 (ref 3.4–5.3)
Sodium: 140 (ref 137–147)

## 2015-12-19 LAB — CBC AND DIFFERENTIAL
HCT: 44 (ref 36–46)
Hemoglobin: 15.1 (ref 12.0–16.0)
PLATELETS: 264 (ref 150–399)
WBC: 7.6

## 2015-12-19 LAB — LIPID PANEL
CHOLESTEROL: 128 (ref 0–200)
Cholesterol: 128 (ref 0–200)
HDL: 40 (ref 35–70)
HDL: 40 (ref 35–70)
LDL CALC: 49
LDL Cholesterol: 49
Triglycerides: 194 — AB (ref 40–160)
Triglycerides: 194 — AB (ref 40–160)

## 2015-12-19 LAB — HEMOGLOBIN A1C: HEMOGLOBIN A1C: 5.9

## 2015-12-19 LAB — HEPATIC FUNCTION PANEL
ALK PHOS: 75 (ref 25–125)
ALT: 74 — AB (ref 7–35)
AST: 61 — AB (ref 13–35)
Bilirubin, Total: 0.6

## 2015-12-19 LAB — TSH: TSH: 1.41 (ref 0.41–5.90)

## 2015-12-26 DIAGNOSIS — Z6833 Body mass index (BMI) 33.0-33.9, adult: Secondary | ICD-10-CM | POA: Diagnosis not present

## 2015-12-26 DIAGNOSIS — Z0000 Encounter for general adult medical examination without abnormal findings: Secondary | ICD-10-CM | POA: Diagnosis not present

## 2015-12-26 DIAGNOSIS — Z1389 Encounter for screening for other disorder: Secondary | ICD-10-CM | POA: Diagnosis not present

## 2015-12-26 DIAGNOSIS — I10 Essential (primary) hypertension: Secondary | ICD-10-CM | POA: Diagnosis not present

## 2016-04-08 DIAGNOSIS — Z23 Encounter for immunization: Secondary | ICD-10-CM | POA: Diagnosis not present

## 2016-05-15 DIAGNOSIS — H10023 Other mucopurulent conjunctivitis, bilateral: Secondary | ICD-10-CM | POA: Diagnosis not present

## 2016-08-28 DIAGNOSIS — J40 Bronchitis, not specified as acute or chronic: Secondary | ICD-10-CM | POA: Diagnosis not present

## 2016-10-23 ENCOUNTER — Other Ambulatory Visit: Payer: Self-pay | Admitting: Family Medicine

## 2016-10-23 DIAGNOSIS — Z1231 Encounter for screening mammogram for malignant neoplasm of breast: Secondary | ICD-10-CM

## 2016-11-24 ENCOUNTER — Ambulatory Visit
Admission: RE | Admit: 2016-11-24 | Discharge: 2016-11-24 | Disposition: A | Discharge status: Home / Self Care | Payer: Medicare Other | Source: Ambulatory Visit | Attending: Family Medicine | Admitting: Family Medicine

## 2016-11-24 DIAGNOSIS — Z1231 Encounter for screening mammogram for malignant neoplasm of breast: Secondary | ICD-10-CM

## 2016-12-31 DIAGNOSIS — Z1389 Encounter for screening for other disorder: Secondary | ICD-10-CM | POA: Diagnosis not present

## 2016-12-31 DIAGNOSIS — E6609 Other obesity due to excess calories: Secondary | ICD-10-CM | POA: Diagnosis not present

## 2016-12-31 DIAGNOSIS — Z0000 Encounter for general adult medical examination without abnormal findings: Secondary | ICD-10-CM | POA: Diagnosis not present

## 2016-12-31 DIAGNOSIS — Z6831 Body mass index (BMI) 31.0-31.9, adult: Secondary | ICD-10-CM | POA: Diagnosis not present

## 2017-02-26 DIAGNOSIS — L259 Unspecified contact dermatitis, unspecified cause: Secondary | ICD-10-CM | POA: Diagnosis not present

## 2017-02-26 DIAGNOSIS — Z6831 Body mass index (BMI) 31.0-31.9, adult: Secondary | ICD-10-CM | POA: Diagnosis not present

## 2017-02-26 DIAGNOSIS — L255 Unspecified contact dermatitis due to plants, except food: Secondary | ICD-10-CM | POA: Diagnosis not present

## 2017-02-26 DIAGNOSIS — E6609 Other obesity due to excess calories: Secondary | ICD-10-CM | POA: Diagnosis not present

## 2017-03-26 DIAGNOSIS — Z23 Encounter for immunization: Secondary | ICD-10-CM | POA: Diagnosis not present

## 2017-04-08 DIAGNOSIS — Z6831 Body mass index (BMI) 31.0-31.9, adult: Secondary | ICD-10-CM | POA: Diagnosis not present

## 2017-04-08 DIAGNOSIS — J019 Acute sinusitis, unspecified: Secondary | ICD-10-CM | POA: Diagnosis not present

## 2017-04-08 DIAGNOSIS — E6609 Other obesity due to excess calories: Secondary | ICD-10-CM | POA: Diagnosis not present

## 2017-04-08 DIAGNOSIS — J069 Acute upper respiratory infection, unspecified: Secondary | ICD-10-CM | POA: Diagnosis not present

## 2017-04-08 DIAGNOSIS — Z1389 Encounter for screening for other disorder: Secondary | ICD-10-CM | POA: Diagnosis not present

## 2017-05-17 ENCOUNTER — Encounter (HOSPITAL_COMMUNITY): Payer: Self-pay | Admitting: Emergency Medicine

## 2017-05-17 ENCOUNTER — Observation Stay (HOSPITAL_COMMUNITY)
Admission: EM | Admit: 2017-05-17 | Discharge: 2017-05-19 | Disposition: A | Discharge status: Home / Self Care | Payer: Medicare Other | Attending: Internal Medicine | Admitting: Internal Medicine

## 2017-05-17 ENCOUNTER — Other Ambulatory Visit: Payer: Self-pay

## 2017-05-17 ENCOUNTER — Emergency Department (HOSPITAL_COMMUNITY): Payer: Medicare Other

## 2017-05-17 DIAGNOSIS — K219 Gastro-esophageal reflux disease without esophagitis: Secondary | ICD-10-CM | POA: Diagnosis present

## 2017-05-17 DIAGNOSIS — E785 Hyperlipidemia, unspecified: Secondary | ICD-10-CM | POA: Diagnosis present

## 2017-05-17 DIAGNOSIS — Z79899 Other long term (current) drug therapy: Secondary | ICD-10-CM | POA: Diagnosis not present

## 2017-05-17 DIAGNOSIS — D72829 Elevated white blood cell count, unspecified: Secondary | ICD-10-CM | POA: Diagnosis not present

## 2017-05-17 DIAGNOSIS — K56609 Unspecified intestinal obstruction, unspecified as to partial versus complete obstruction: Secondary | ICD-10-CM | POA: Diagnosis present

## 2017-05-17 DIAGNOSIS — K760 Fatty (change of) liver, not elsewhere classified: Secondary | ICD-10-CM | POA: Diagnosis present

## 2017-05-17 DIAGNOSIS — I10 Essential (primary) hypertension: Secondary | ICD-10-CM | POA: Diagnosis present

## 2017-05-17 DIAGNOSIS — K56600 Partial intestinal obstruction, unspecified as to cause: Secondary | ICD-10-CM | POA: Diagnosis not present

## 2017-05-17 DIAGNOSIS — R109 Unspecified abdominal pain: Secondary | ICD-10-CM | POA: Diagnosis present

## 2017-05-17 DIAGNOSIS — Z7982 Long term (current) use of aspirin: Secondary | ICD-10-CM | POA: Diagnosis not present

## 2017-05-17 LAB — CBC
HEMATOCRIT: 44.9 % (ref 36.0–46.0)
HEMOGLOBIN: 14.9 g/dL (ref 12.0–15.0)
MCH: 31.7 pg (ref 26.0–34.0)
MCHC: 33.2 g/dL (ref 30.0–36.0)
MCV: 95.5 fL (ref 78.0–100.0)
Platelets: 294 10*3/uL (ref 150–400)
RBC: 4.7 MIL/uL (ref 3.87–5.11)
RDW: 12.3 % (ref 11.5–15.5)
WBC: 20.9 10*3/uL — ABNORMAL HIGH (ref 4.0–10.5)

## 2017-05-17 LAB — URINALYSIS, ROUTINE W REFLEX MICROSCOPIC
BILIRUBIN URINE: NEGATIVE
Glucose, UA: NEGATIVE mg/dL
HGB URINE DIPSTICK: NEGATIVE
Ketones, ur: NEGATIVE mg/dL
Leukocytes, UA: NEGATIVE
NITRITE: NEGATIVE
PROTEIN: 30 mg/dL — AB
Specific Gravity, Urine: >1.046 — ABNORMAL HIGH (ref 1.005–1.030)
pH: 6 (ref 5.0–8.0)

## 2017-05-17 LAB — COMPREHENSIVE METABOLIC PANEL
ALBUMIN: 4.3 g/dL (ref 3.5–5.0)
ALT: 29 U/L (ref 14–54)
ANION GAP: 8 (ref 5–15)
AST: 33 U/L (ref 15–41)
Alkaline Phosphatase: 77 U/L (ref 38–126)
BUN: 17 mg/dL (ref 6–20)
CHLORIDE: 103 mmol/L (ref 101–111)
CO2: 27 mmol/L (ref 22–32)
Calcium: 9.3 mg/dL (ref 8.9–10.3)
Creatinine, Ser: 0.7 mg/dL (ref 0.44–1.00)
GFR calc Af Amer: >60 mL/min (ref >60)
GFR calc non Af Amer: >60 mL/min (ref >60)
GLUCOSE: 139 mg/dL — AB (ref 65–99)
POTASSIUM: 3.7 mmol/L (ref 3.5–5.1)
SODIUM: 138 mmol/L (ref 135–145)
TOTAL PROTEIN: 7.6 g/dL (ref 6.5–8.1)
Total Bilirubin: 1.2 mg/dL (ref 0.3–1.2)

## 2017-05-17 LAB — LIPASE, BLOOD: LIPASE: 33 U/L (ref 11–51)

## 2017-05-17 MED ORDER — PANTOPRAZOLE SODIUM 40 MG PO TBEC
40.0000 mg | DELAYED_RELEASE_TABLET | Freq: Every day | ORAL | Status: DC
  Administered 2017-05-18 – 2017-05-19 (×2): 40 mg via ORAL
  Filled 2017-05-17 (×2): qty 1

## 2017-05-17 MED ORDER — ENOXAPARIN SODIUM 40 MG/0.4ML ~~LOC~~ SOLN
40.0000 mg | SUBCUTANEOUS | Status: DC
  Administered 2017-05-17 – 2017-05-18 (×2): 40 mg via SUBCUTANEOUS
  Filled 2017-05-17 (×2): qty 0.4

## 2017-05-17 MED ORDER — ACETAMINOPHEN 325 MG PO TABS
650.0000 mg | ORAL_TABLET | Freq: Four times a day (QID) | ORAL | Status: DC | PRN
Start: 2017-05-17 — End: 2017-05-19
  Administered 2017-05-18: 650 mg via ORAL
  Filled 2017-05-17: qty 2

## 2017-05-17 MED ORDER — AMLODIPINE BESYLATE 5 MG PO TABS
5.0000 mg | ORAL_TABLET | Freq: Every day | ORAL | Status: DC
  Administered 2017-05-18 – 2017-05-19 (×2): 5 mg via ORAL
  Filled 2017-05-17 (×2): qty 1

## 2017-05-17 MED ORDER — SODIUM CHLORIDE 0.9 % IV BOLUS (SEPSIS)
500.0000 mL | Freq: Once | INTRAVENOUS | Status: AC
  Administered 2017-05-17: 500 mL via INTRAVENOUS

## 2017-05-17 MED ORDER — PIPERACILLIN-TAZOBACTAM 3.375 G IVPB
3.3750 g | Freq: Three times a day (TID) | INTRAVENOUS | Status: DC
  Administered 2017-05-17 – 2017-05-19 (×6): 3.375 g via INTRAVENOUS
  Filled 2017-05-17 (×6): qty 50

## 2017-05-17 MED ORDER — LACTATED RINGERS IV SOLN
INTRAVENOUS | Status: AC
  Administered 2017-05-17: 20:00:00 via INTRAVENOUS

## 2017-05-17 MED ORDER — ONDANSETRON HCL 4 MG/2ML IJ SOLN: 4.0000 mg | Freq: Four times a day (QID) | INTRAMUSCULAR | Status: DC | PRN

## 2017-05-17 MED ORDER — IOPAMIDOL (ISOVUE-300) INJECTION 61%
100.0000 mL | Freq: Once | INTRAVENOUS | Status: AC | PRN
  Administered 2017-05-17: 100 mL via INTRAVENOUS

## 2017-05-17 MED ORDER — ONDANSETRON HCL 4 MG PO TABS: 4.0000 mg | ORAL_TABLET | Freq: Four times a day (QID) | ORAL | Status: DC | PRN

## 2017-05-17 MED ORDER — ONDANSETRON 4 MG PO TBDP
4.0000 mg | ORAL_TABLET | Freq: Once | ORAL | Status: AC | PRN
  Administered 2017-05-17: 4 mg via ORAL
  Filled 2017-05-17: qty 1

## 2017-05-17 MED ORDER — ACETAMINOPHEN 650 MG RE SUPP: 650.0000 mg | Freq: Four times a day (QID) | RECTAL | Status: DC | PRN

## 2017-05-17 MED ORDER — LOSARTAN POTASSIUM 50 MG PO TABS
50.0000 mg | ORAL_TABLET | Freq: Every day | ORAL | Status: DC
  Administered 2017-05-18 – 2017-05-19 (×2): 50 mg via ORAL
  Filled 2017-05-17 (×2): qty 1

## 2017-05-17 MED ORDER — ASPIRIN EC 81 MG PO TBEC
81.0000 mg | DELAYED_RELEASE_TABLET | Freq: Every day | ORAL | Status: DC
Start: 2017-05-18 — End: 2017-05-19
  Administered 2017-05-18 – 2017-05-19 (×2): 81 mg via ORAL
  Filled 2017-05-17 (×2): qty 1

## 2017-05-17 MED ORDER — ATORVASTATIN CALCIUM 10 MG PO TABS
10.0000 mg | ORAL_TABLET | Freq: Every day | ORAL | Status: DC
  Administered 2017-05-18 – 2017-05-19 (×2): 10 mg via ORAL
  Filled 2017-05-17 (×2): qty 1

## 2017-05-17 MED ORDER — MORPHINE SULFATE (PF) 4 MG/ML IV SOLN
4.0000 mg | Freq: Once | INTRAVENOUS | Status: AC
Start: 2017-05-17 — End: 2017-05-17
  Administered 2017-05-17: 4 mg via INTRAVENOUS
  Filled 2017-05-17: qty 1

## 2017-05-17 NOTE — H&P (Signed)
History and Physical    [name] [name] JME:[id number] DOB: [birth year] DOA: 05/17/2017  PCP: Sharilyn Sites, MD   Patient coming from: Home  Chief Complaint: Generalized abdominal pain  HPI: [name] [name] is a 71 y.o. female with medical history significant for repeat small bowel obstructions, dyslipidemia, and hypertension who presents to the emergency department today after abdominal pain that began at approximately 4 AM this morning.  She describes the pain as being generalized with no other radiation.  She states that is diffuse and achy.  She is currently passing flatus and did actually have a small bowel movement this morning.  She has been feeling nauseous and had one episode of vomiting at 9:30 AM and has continued to have some dry heaves.  She was actually able to tolerate having some coffee earlier today.  She states that her symptoms are currently similar to her prior small bowel obstruction.  She denies any hematemesis, blood on her stools, fever, chills, or recent weight change.   ED Course: Vital signs are stable.  Laboratory data demonstrates a blood glucose of 139 and leukocytosis of 20,900.  CT of abdomen and pelvis with contrast demonstrates low to mid grade distal partial small bowel obstruction with transition in the mid to distal ileum without obvious cause.  General surgery was spoken to by the ED physician and they agreed to see patient in a.m.  Review of Systems: As per HPI otherwise 10 point review of systems negative.   Past Medical History:  Diagnosis Date  . Bowel obstruction ([place])   . H/O hiatal hernia   . Hyperlipidemia   . Hypertension     Past Surgical History:  Procedure Laterality Date  . ABDOMINAL HYSTERECTOMY    . APPENDECTOMY    . COLONOSCOPY  03/18/2012   Procedure: COLONOSCOPY;  Surgeon: Rogene Houston, MD;  Location: [facility];  Service: Endoscopy;  Laterality: N/A;  930  . LAPAROTOMY N/A 02/14/2013   Procedure: EXPLORATORY  LAPAROTOMY;  Surgeon: Jamesetta So, MD;  Location: [facility] ORS;  Service: General;  Laterality: N/A;     reports that  has never smoked. She does not have any smokeless tobacco history on file. She reports that she does not drink alcohol or use drugs.  No Known Allergies  Family History  Problem Relation Age of Onset  . Cancer Unknown     Prior to Admission medications   Medication Sig Start Date End Date Taking? Authorizing Provider  amLODipine (NORVASC) 5 MG tablet Take 5 mg by mouth daily.   Yes [provider]  aspirin EC 81 MG tablet Take 81 mg by mouth daily.   Yes [provider]  atorvastatin (LIPITOR) 10 MG tablet Take 10 mg by mouth daily. 09/17/15  Yes [provider]  beta carotene w/minerals (OCUVITE) tablet Take 1 tablet by mouth daily.   Yes [provider]  Calcium Carbonate-Vitamin D (CALCIUM 600 + D PO) Take 1 tablet by mouth 2 (two) times daily.   Yes [provider]  cholecalciferol (VITAMIN D) 1000 UNITS tablet Take 10,000 Units by mouth daily.    Yes [provider]  docusate sodium 100 MG CAPS Take 100 mg by mouth daily. Patient taking differently: Take 100 mg by mouth 2 (two) times daily.  10/03/13  Yes Felicie Morn, MD  Fish Oil-Cholecalciferol (FISH OIL + D3) 1000-1000 MG-UNIT CAPS Take 1 capsule by mouth 2 (two) times daily.   Yes [provider]  Lactobacillus-Inulin (CULTURELLE  DIGESTIVE HEALTH PO) Take 1 capsule by mouth daily.    Yes [provider]  losartan (COZAAR) 50 MG tablet Take 50 mg by mouth daily.   Yes [provider]  Misc Natural Products (OSTEO BI-FLEX ADV DOUBLE ST PO) Take 1 tablet by mouth 2 (two) times daily.   Yes [provider]  Multiple Vitamin (MULTIVITAMIN WITH MINERALS) TABS Take 1 tablet by mouth daily.   Yes [provider]  Multiple Vitamins-Minerals (EYE VITAMINS PO) Take by mouth. [place] Vitamin   Yes [provider]  omeprazole (PRILOSEC) 40 MG capsule Take 40 mg by mouth daily.   Yes [provider]  polyethylene glycol powder (GLYCOLAX/MIRALAX) powder Take 17 g by mouth daily.   Yes [provider]  Potassium 99 MG TABS Take 1 tablet by mouth daily.   Yes [provider]    Physical Exam: Vitals:   05/17/17 1345 05/17/17 1530 05/17/17 1730 05/17/17 1844  BP: 135/82 132/79 120/67 113/71  Pulse: 77 74 85 85  Resp: 18 20 20 20   Temp:    98.4 F (36.9 C)  TempSrc:    Oral  SpO2: 96% 98% 91% 93%  Weight:    81.6 kg (180 lb)  Height:    5\' 4"  (1.626 m)    Constitutional: NAD, calm, comfortable Vitals:   05/17/17 1345 05/17/17 1530 05/17/17 1730 05/17/17 1844  BP: 135/82 132/79 120/67 113/71  Pulse: 77 74 85 85  Resp: 18 20 20 20   Temp:    98.4 F (36.9 C)  TempSrc:    Oral  SpO2: 96% 98% 91% 93%  Weight:    81.6 kg (180 lb)  Height:    5\' 4"  (1.626 m)   Eyes: lids and conjunctivae normal ENMT: Mucous membranes are moist.  Neck: normal, supple Respiratory: clear to auscultation bilaterally. Normal respiratory effort. No accessory muscle use.  Cardiovascular: Regular rate and rhythm, no murmurs. No extremity edema. Abdomen: no tenderness, no distention. Bowel sounds positive.  Musculoskeletal:  No joint deformity upper and lower extremities.   Skin: no rashes, lesions, ulcers.  Psychiatric: Normal judgment and insight. Alert and oriented x 3. Normal mood.   Labs on Admission: I have personally reviewed following labs and imaging studies  CBC: Recent Labs  Lab 05/17/17 1335  WBC 20.9*  HGB 14.9  HCT 44.9  MCV 95.5  PLT 893   Basic Metabolic Panel: Recent Labs  Lab 05/17/17 1335  NA 138  K 3.7  CL 103  CO2 27  GLUCOSE 139*  BUN 17  CREATININE 0.70  CALCIUM 9.3   GFR: Estimated Creatinine Clearance: 67.7 mL/min (by C-G formula based on SCr of 0.7 mg/dL). Liver Function Tests: Recent Labs  Lab 05/17/17 1335  AST 33  ALT 29   ALKPHOS 77  BILITOT 1.2  PROT 7.6  ALBUMIN 4.3   Recent Labs  Lab 05/17/17 1335  LIPASE 33   No results for input(s): AMMONIA in the last 168 hours. Coagulation Profile: No results for input(s): INR, PROTIME in the last 168 hours. Cardiac Enzymes: No results for input(s): CKTOTAL, CKMB, CKMBINDEX, TROPONINI in the last 168 hours. BNP (last 3 results) No results for input(s): PROBNP in the last 8760 hours. HbA1C: No results for input(s): HGBA1C in the last 72 hours. CBG: No results for input(s): GLUCAP in the last 168 hours. Lipid Profile: No results for input(s): CHOL, HDL, LDLCALC, TRIG, CHOLHDL, LDLDIRECT in the last 72 hours. Thyroid Function Tests:  No results for input(s): TSH, T4TOTAL, FREET4, T3FREE, THYROIDAB in the last 72 hours. Anemia Panel: No results for input(s): VITAMINB12, FOLATE, FERRITIN, TIBC, IRON, RETICCTPCT in the last 72 hours. Urine analysis:    Component Value Date/Time   COLORURINE YELLOW 11/01/2015 1930   APPEARANCEUR CLEAR 11/01/2015 1930   LABSPEC 1.025 11/01/2015 1930   PHURINE 6.0 11/01/2015 1930   GLUCOSEU NEGATIVE 11/01/2015 1930   HGBUR NEGATIVE 11/01/2015 1930   BILIRUBINUR NEGATIVE 11/01/2015 1930   KETONESUR TRACE (A) 11/01/2015 1930   PROTEINUR TRACE (A) 11/01/2015 1930   UROBILINOGEN 0.2 03/24/2015 2055   NITRITE NEGATIVE 11/01/2015 1930   LEUKOCYTESUR SMALL (A) 11/01/2015 1930    Radiological Exams on Admission: Ct Abdomen Pelvis W Contrast  Result Date: 05/17/2017 CLINICAL DATA:  Bowel obstruction. EXAM: CT ABDOMEN AND PELVIS WITH CONTRAST TECHNIQUE: Multidetector CT imaging of the abdomen and pelvis was performed using the standard protocol following bolus administration of intravenous contrast. CONTRAST:  133mL ISOVUE-300 IOPAMIDOL (ISOVUE-300) INJECTION 61% COMPARISON:  11/01/2015 FINDINGS: Lower chest: No acute abnormality. Coronary artery calcifications in the visualized left anterior descending and right coronary artery.  Hepatobiliary: No focal hepatic abnormality. Gallbladder unremarkable. Pancreas: No focal abnormality or ductal dilatation. Spleen: No focal abnormality.  Normal size. Adrenals/Urinary Tract: No adrenal abnormality. No focal renal abnormality. No stones or hydronephrosis. Urinary bladder is unremarkable. Stomach/Bowel: Few scattered sigmoid diverticula. No active diverticulitis. There are dilated small bowel loops in the abdomen pelvis. Distal small bowel loops decompressed. Transition appears to be in the right lower quadrant on image 32 of coronal series 6. No visible cause, presumably stricture or adhesions. Stomach unremarkable. Vascular/Lymphatic: Scattered aortic and iliac calcifications. No evidence of aneurysm or adenopathy. Reproductive: Prior hysterectomy.  No adnexal masses. Other: No free fluid or free air. Musculoskeletal: No acute bony abnormality. IMPRESSION: Low to mid grade distal partial small bowel obstruction with transition in the mid to distal ileum without obvious cause, presumably stricture or adhesions. Electronically Signed   By: Rolm Baptise M.D.   On: 05/17/2017 15:32    EKG: None.  Assessment/Plan Principal Problem:   Partial small bowel obstruction (HCC) Active Problems:   Fatty liver   Leukocytosis   Small bowel obstruction (HCC)   Hypertension   Hyperlipidemia   GERD (gastroesophageal reflux disease)    Low to mid grade distal partial small bowel obstruction-recurrent Consultation to general surgery in am; appreciate recommendations Continue on sips and meds for now with likely diet advancement in a.m. if no procedures considered Zofran as needed, patient does not appear to require NG tube IV fluid IV Zosyn empirically on account of significant leukocytosis  Hypertension-controlled Continue home medications and monitor  Hyperlipidemia Continue home medications  GERD Continue PPI   DVT prophylaxis: Lovenox Code Status: Full Family Communication:  None Disposition Plan:Home likely by AM once diet advanced and tolerated Consults called:Dr. Orlene Plum Surgery Admission status: Obs, med-surg   [name] [name] Darleen Crocker DO [facility] Pager [phone]  If 7PM-7AM, please contact night-coverage [email or web address] Password [email or web address]  05/17/2017, 7:30 PM

## 2017-05-17 NOTE — ED Notes (Signed)
Report given to 300 RN at this time.  

## 2017-05-17 NOTE — Progress Notes (Signed)
Pharmacy Antibiotic Note  [name] [name] is a 71 y.o. female admitted on 05/17/2017 with intra-abdominal infection.  Pharmacy has been consulted for zosyn dosing.  Plan: Zosyn 3.375g IV q8h (4 hour infusion).  F/U clinical progress Monitor V/S, labs  Height: 5\' 4"  (162.6 cm) Weight: 180 lb (81.6 kg) IBW/kg (Calculated) : 54.7  Temp (24hrs), Avg:97.7 F (36.5 C), Min:97.5 F (36.4 C), Max:97.9 F (36.6 C)  Recent Labs  Lab 05/17/17 1335  WBC 20.9*  CREATININE 0.70    Estimated Creatinine Clearance: 67.7 mL/min (by C-G formula based on SCr of 0.7 mg/dL).    No Known Allergies  Antimicrobials this admission: Zosyn 12/16 >>   Dose adjustments this admission: N/A  Microbiology results: No cultures  Thank you for allowing pharmacy to be a part of this patient's care. Isac Sarna, BS [place], [place] Clinical Pharmacist Pager [phone] 05/17/2017 4:57 PM

## 2017-05-17 NOTE — ED Notes (Signed)
Pt in CT at this time.

## 2017-05-17 NOTE — ED Triage Notes (Signed)
Patient c/o mid abd pain with nausea and vomiting. Denies any diarrhea. Per patient last BM this morning, normal-no blood noted. Denies any fevers or urinary symptoms. Hx or bowel obstruction.

## 2017-05-17 NOTE — ED Provider Notes (Signed)
[facility] EMERGENCY DEPARTMENT Provider Note   CSN: [id number] Arrival date & time: 05/17/17  1053     History   Chief Complaint Chief Complaint  Patient presents with  . Abdominal Pain    HPI [name] [name] is a 71 y.o. female.  HPI Patient developed abdominal pain this morning around 4 AM.  She has had multiple bouts of nonbloody emesis.  States she had one small bowel movement this morning.  No fever or chills.  Symptoms are similar to previous small bowel obstruction. Past Medical History:  Diagnosis Date  . Bowel obstruction ([place])   . H/O hiatal hernia   . Hyperlipidemia   . Hypertension     Patient Active Problem List   Diagnosis Date Noted  . GERD (gastroesophageal reflux disease) 03/24/2015  . Hypokalemia 06/21/2014  . Hyperglycemia 06/20/2014  . Hypertension   . Hyperlipidemia   . AP (abdominal pain)   . Bowel obstruction ([place]) 10/01/2013  . SBO (small bowel obstruction) ([place]) 08/15/2013  . Small bowel obstruction ([place]) 07/08/2013  . Partial small bowel obstruction ([place]) 02/12/2013  . Fatty liver 02/12/2013  . Transaminitis 02/12/2013  . Leukocytosis 02/12/2013  . Nausea with vomiting 02/12/2013  . Abdominal pain 02/12/2013    Past Surgical History:  Procedure Laterality Date  . ABDOMINAL HYSTERECTOMY    . APPENDECTOMY    . COLONOSCOPY  03/18/2012   Procedure: COLONOSCOPY;  Surgeon: Rogene Houston, MD;  Location: [facility] ENDO SU[facility];  Service: Endoscopy;  Laterality: N/A;  930  . LAPAROTOMY N/A 02/14/2013   Procedure: EXPLORATORY LAPAROTOMY;  Surgeon: Jamesetta So, MD;  Location: [facility] ORS;  Service: General;  Laterality: N/A;    OB History    No data available       Home Medications    Prior to Admission medications   Medication Sig Start Date End Date Taking? Authorizing Provider  amLODipine (NORVASC) 5 MG tablet Take 5 mg by mouth daily.   Yes [provider]  aspirin EC 81 MG tablet Take 81 mg by mouth daily.   Yes [provider]  atorvastatin (LIPITOR) 10 MG tablet Take 10 mg by mouth daily. 09/17/15  Yes [provider]  beta carotene w/minerals (OCUVITE) tablet Take 1 tablet by mouth daily.   Yes [provider]  Calcium Carbonate-Vitamin D (CALCIUM 600 + D PO) Take 1 tablet by mouth 2 (two) times daily.   Yes [provider]  cholecalciferol (VITAMIN D) 1000 UNITS tablet Take 10,000 Units by mouth daily.    Yes [provider]  docusate sodium 100 MG CAPS Take 100 mg by mouth daily. Patient taking differently: Take 100 mg by mouth 2 (two) times daily.  10/03/13  Yes Felicie Morn, MD  Fish Oil-Cholecalciferol (FISH OIL + D3) 1000-1000 MG-UNIT CAPS Take 1 capsule by mouth 2 (two) times daily.   Yes [provider]  Lactobacillus-Inulin (CULTURELLE DIGESTIVE HEALTH PO) Take 1 capsule by mouth daily.    Yes [provider]  losartan (COZAAR) 50 MG tablet Take 50 mg by mouth daily.   Yes [provider]  Misc Natural Products (OSTEO BI-FLEX ADV DOUBLE ST PO) Take 1 tablet by mouth 2 (two) times daily.   Yes [provider]  Multiple Vitamin (MULTIVITAMIN WITH MINERALS) TABS Take 1 tablet by mouth daily.   Yes [provider]  Multiple Vitamins-Minerals (EYE VITAMINS PO) Take by mouth. [place] Vitamin   Yes [provider]  omeprazole ([place])  40 MG capsule Take 40 mg by mouth daily.   Yes [provider]  polyethylene glycol powder (GLYCOLAX/MIRALAX) powder Take 17 g by mouth daily.   Yes [provider]  Potassium 99 MG TABS Take 1 tablet by mouth daily.   Yes [provider]    Family History Family History  Problem Relation Age of Onset  . Cancer Unknown     Social History Social History   Tobacco Use  . Smoking status: Never Smoker  Substance Use Topics  . Alcohol use: No  . Drug use: No     Allergies   Patient has no known allergies.   Review of  Systems Review of Systems  Constitutional: Negative for chills and fever.  Respiratory: Negative for cough and shortness of breath.   Cardiovascular: Negative for chest pain and leg swelling.  Gastrointestinal: Positive for abdominal distention, abdominal pain, nausea and vomiting. Negative for blood in stool, constipation and diarrhea.  Genitourinary: Negative for dysuria, flank pain and frequency.  Musculoskeletal: Negative for back pain, myalgias and neck pain.  Skin: Negative for rash and wound.  Neurological: Negative for dizziness, weakness, light-headedness, numbness and headaches.  All other systems reviewed and are negative.    Physical Exam Updated Vital Signs BP 135/82   Pulse 77   Temp (!) 97.5 F (36.4 C) (Oral)   Resp 18   Ht 5\' 4"  (1.626 m)   Wt 81.6 kg (180 lb)   LMP 09/10/2014 (Exact Date)   SpO2 96%   BMI 30.90 kg/m   Physical Exam  Constitutional: She is oriented to person, place, and time. She appears well-developed and well-nourished. She does not appear ill.  HENT:  Head: Normocephalic and atraumatic.  Mouth/Throat: Oropharynx is clear and moist.  Eyes: EOM are normal. Pupils are equal, round, and reactive to light.  Neck: Normal range of motion. Neck supple.  Cardiovascular: Normal rate and regular rhythm.  Pulmonary/Chest: Effort normal and breath sounds normal.  Abdominal: Soft. Bowel sounds are increased. There is tenderness. There is no rebound and no guarding.  Diffuse abdominal tenderness appears most pronounced in the right lower quadrant.  High-pitched bowel sounds.  No rebound or guarding.  Musculoskeletal: Normal range of motion. She exhibits no edema or tenderness.  Neurological: She is alert and oriented to person, place, and time.  Skin: Skin is warm and dry. Capillary refill takes less than 2 seconds. No rash noted. No erythema.  Psychiatric: She has a normal mood and affect. Her behavior is normal.  Nursing note and vitals  reviewed.    ED Treatments / Results  Labs (all labs ordered are listed, but only abnormal results are displayed) Labs Reviewed  COMPREHENSIVE METABOLIC PANEL - Abnormal; Notable for the following components:      Result Value   Glucose, Bld 139 (*)    All other components within normal limits  CBC - Abnormal; Notable for the following components:   WBC 20.9 (*)    All other components within normal limits  LIPASE, BLOOD  URINALYSIS, ROUTINE W REFLEX MICROSCOPIC    EKG  EKG Interpretation None       Radiology Ct Abdomen Pelvis W Contrast  Result Date: 05/17/2017 CLINICAL DATA:  Bowel obstruction. EXAM: CT ABDOMEN AND PELVIS WITH CONTRAST TECHNIQUE: Multidetector CT imaging of the abdomen and pelvis was performed using the standard protocol following bolus administration of intravenous contrast. CONTRAST:  123mL ISOVUE-300 IOPAMIDOL (ISOVUE-300) INJECTION 61% COMPARISON:  11/01/2015 FINDINGS: Lower chest: No acute  abnormality. Coronary artery calcifications in the visualized left anterior descending and right coronary artery. Hepatobiliary: No focal hepatic abnormality. Gallbladder unremarkable. Pancreas: No focal abnormality or ductal dilatation. Spleen: No focal abnormality.  Normal size. Adrenals/Urinary Tract: No adrenal abnormality. No focal renal abnormality. No stones or hydronephrosis. Urinary bladder is unremarkable. Stomach/Bowel: Few scattered sigmoid diverticula. No active diverticulitis. There are dilated small bowel loops in the abdomen pelvis. Distal small bowel loops decompressed. Transition appears to be in the right lower quadrant on image 32 of coronal series 6. No visible cause, presumably stricture or adhesions. Stomach unremarkable. Vascular/Lymphatic: Scattered aortic and iliac calcifications. No evidence of aneurysm or adenopathy. Reproductive: Prior hysterectomy.  No adnexal masses. Other: No free fluid or free air. Musculoskeletal: No acute bony abnormality.  IMPRESSION: Low to mid grade distal partial small bowel obstruction with transition in the mid to distal ileum without obvious cause, presumably stricture or adhesions. Electronically Signed   By: Rolm Baptise M.D.   On: 05/17/2017 15:32    Procedures Procedures (including critical care time)  Medications Ordered in ED Medications  ondansetron (ZOFRAN-ODT) disintegrating tablet 4 mg (4 mg Oral Given 05/17/17 1245)  morphine 4 MG/ML injection 4 mg (4 mg Intravenous Given 05/17/17 1409)  sodium chloride 0.9 % bolus 500 mL (500 mLs Intravenous New Bag/Given 05/17/17 1409)  iopamidol (ISOVUE-300) 61 % injection 100 mL (100 mLs Intravenous Contrast Given 05/17/17 1457)     Initial Impression / Assessment and Plan / ED Course  I have reviewed the triage vital signs and the nursing notes.  Pertinent labs & imaging results that were available during my care of the patient were reviewed by me and considered in my medical decision making (see chart for details).     Discussed with Dr. Arnoldo Morale.  Will consult on patient.  Advised to have hospitalist admit.  Final Clinical Impressions(s) / ED Diagnoses   Final diagnoses:  Small bowel obstruction [facility])    ED Discharge Orders    None       Julianne Rice, MD 05/17/17 1542

## 2017-05-18 DIAGNOSIS — E785 Hyperlipidemia, unspecified: Secondary | ICD-10-CM

## 2017-05-18 DIAGNOSIS — K219 Gastro-esophageal reflux disease without esophagitis: Secondary | ICD-10-CM

## 2017-05-18 DIAGNOSIS — I10 Essential (primary) hypertension: Secondary | ICD-10-CM

## 2017-05-18 DIAGNOSIS — K56600 Partial intestinal obstruction, unspecified as to cause: Secondary | ICD-10-CM | POA: Diagnosis not present

## 2017-05-18 DIAGNOSIS — D72829 Elevated white blood cell count, unspecified: Secondary | ICD-10-CM | POA: Diagnosis not present

## 2017-05-18 LAB — CBC
HEMATOCRIT: 38.5 % (ref 36.0–46.0)
Hemoglobin: 12.5 g/dL (ref 12.0–15.0)
MCH: 31.4 pg (ref 26.0–34.0)
MCHC: 32.5 g/dL (ref 30.0–36.0)
MCV: 96.7 fL (ref 78.0–100.0)
Platelets: 264 10*3/uL (ref 150–400)
RBC: 3.98 MIL/uL (ref 3.87–5.11)
RDW: 12.6 % (ref 11.5–15.5)
WBC: 13.4 10*3/uL — ABNORMAL HIGH (ref 4.0–10.5)

## 2017-05-18 LAB — COMPREHENSIVE METABOLIC PANEL
ALBUMIN: 3 g/dL — AB (ref 3.5–5.0)
ALK PHOS: 52 U/L (ref 38–126)
ALT: 19 U/L (ref 14–54)
ANION GAP: 6 (ref 5–15)
AST: 23 U/L (ref 15–41)
BUN: 15 mg/dL (ref 6–20)
CALCIUM: 8.5 mg/dL — AB (ref 8.9–10.3)
CO2: 27 mmol/L (ref 22–32)
Chloride: 106 mmol/L (ref 101–111)
Creatinine, Ser: 0.57 mg/dL (ref 0.44–1.00)
GFR calc non Af Amer: >60 mL/min (ref >60)
GLUCOSE: 95 mg/dL (ref 65–99)
POTASSIUM: 3.2 mmol/L — AB (ref 3.5–5.1)
SODIUM: 139 mmol/L (ref 135–145)
TOTAL PROTEIN: 5.5 g/dL — AB (ref 6.5–8.1)
Total Bilirubin: 1.3 mg/dL — ABNORMAL HIGH (ref 0.3–1.2)

## 2017-05-18 LAB — GLUCOSE, CAPILLARY: GLUCOSE-CAPILLARY: 86 mg/dL (ref 65–99)

## 2017-05-18 MED ORDER — POLYETHYLENE GLYCOL 3350 17 G PO PACK
17.0000 g | PACK | Freq: Every day | ORAL | Status: DC
  Administered 2017-05-18 – 2017-05-19 (×2): 17 g via ORAL
  Filled 2017-05-18 (×2): qty 1

## 2017-05-18 MED ORDER — POTASSIUM CHLORIDE CRYS ER 20 MEQ PO TBCR
40.0000 meq | EXTENDED_RELEASE_TABLET | Freq: Once | ORAL | Status: AC
  Administered 2017-05-18: 40 meq via ORAL
  Filled 2017-05-18: qty 2

## 2017-05-18 NOTE — Progress Notes (Signed)
PROGRESS NOTE    [name] [name]  PPJ:[id number] DOB: 11-23-1945 DOA: 05/17/2017 PCP: Sharilyn Sites, MD    Brief Narrative:  71 year old female admitted with generalized abdominal pain.  Imaging indicated partial small bowel obstruction.  Admitted for supportive therapy.  Noted to have a leukocytosis of 20,000.  Started empirically on Zosyn.   Assessment & Plan:   Principal Problem:   Partial small bowel obstruction (HCC) Active Problems:   Fatty liver   Leukocytosis   Small bowel obstruction (HCC)   Hypertension   Hyperlipidemia   GERD (gastroesophageal reflux disease)   1. Partial small bowel obstruction.  Clinically, appears to be resolving.  Passing flatus and had a small bowel movement today.  Continue to advance diet as tolerated.  General surgery following.  Leukocytosis has improved.  We will continue Zosyn for now. 2. Hypertension.  Appears controlled.  Continue on losartan, Norvasc 3. Hyperlipidemia.  Continue Lipitor 4. GERD.  Continue PPI   DVT prophylaxis: Lovenox Code Status: Full code Family Communication: No family present Disposition Plan: Discharge home once improved, likely in a.m.   Consultants:   General surgery  Procedures:    Antimicrobials:   Zosyn 12/16 >   Subjective: Passing flatus.  Had a small bowel movement this morning.  No nausea or vomiting  Objective: Vitals:   05/17/17 1730 05/17/17 1844 05/18/17 0538 05/18/17 1430  BP: 120/67 113/71 (!) 146/72 (!) 120/59  Pulse: 85 85 70 65  Resp: 20 20 18 18   Temp:  98.4 F (36.9 C) 98.4 F (36.9 C) 98 F (36.7 C)  TempSrc:  Oral Oral   SpO2: 91% 93% 95% 99%  Weight:  81.6 kg (180 lb)    Height:  5\' 4"  (1.626 m)      Intake/Output Summary (Last 24 hours) at 05/18/2017 1435 Last data filed at 05/18/2017 1340 Gross per 24 hour  Intake 1476.25 ml  Output 600 ml  Net 876.25 ml   Filed Weights   05/17/17 1113 05/17/17 1844  Weight: 81.6 kg (180 lb) 81.6 kg (180 lb)     Examination:  General exam: Appears calm and comfortable  Respiratory system: Clear to auscultation. Respiratory effort normal. Cardiovascular system: S1 & S2 heard, RRR. No JVD, murmurs, rubs, gallops or clicks. No pedal edema. Gastrointestinal system: Abdomen is nondistended, soft and nontender. No organomegaly or masses felt. Normal bowel sounds heard. Central nervous system: Alert and oriented. No focal neurological deficits. Extremities: Symmetric 5 x 5 power. Skin: No rashes, lesions or ulcers Psychiatry: Judgement and insight appear normal. Mood & affect appropriate.     Data Reviewed: I have personally reviewed following labs and imaging studies  CBC: Recent Labs  Lab 05/17/17 1335 05/18/17 0625  WBC 20.9* 13.4*  HGB 14.9 12.5  HCT 44.9 38.5  MCV 95.5 96.7  PLT 294 580   Basic Metabolic Panel: Recent Labs  Lab 05/17/17 1335 05/18/17 0625  NA 138 139  K 3.7 3.2*  CL 103 106  CO2 27 27  GLUCOSE 139* 95  BUN 17 15  CREATININE 0.70 0.57  CALCIUM 9.3 8.5*   GFR: Estimated Creatinine Clearance: 67.7 mL/min (by C-G formula based on SCr of 0.57 mg/dL). Liver Function Tests: Recent Labs  Lab 05/17/17 1335 05/18/17 0625  AST 33 23  ALT 29 19  ALKPHOS 77 52  BILITOT 1.2 1.3*  PROT 7.6 5.5*  ALBUMIN 4.3 3.0*   Recent Labs  Lab 05/17/17 1335  LIPASE 33   No results for input(s):  AMMONIA in the last 168 hours. Coagulation Profile: No results for input(s): INR, PROTIME in the last 168 hours. Cardiac Enzymes: No results for input(s): CKTOTAL, CKMB, CKMBINDEX, TROPONINI in the last 168 hours. BNP (last 3 results) No results for input(s): PROBNP in the last 8760 hours. HbA1C: No results for input(s): HGBA1C in the last 72 hours. CBG: Recent Labs  Lab 05/18/17 0719  GLUCAP 86   Lipid Profile: No results for input(s): CHOL, HDL, LDLCALC, TRIG, CHOLHDL, LDLDIRECT in the last 72 hours. Thyroid Function Tests: No results for input(s): TSH, T4TOTAL,  FREET4, T3FREE, THYROIDAB in the last 72 hours. Anemia Panel: No results for input(s): VITAMINB12, FOLATE, FERRITIN, TIBC, IRON, RETICCTPCT in the last 72 hours. Sepsis Labs: No results for input(s): PROCALCITON, LATICACIDVEN in the last 168 hours.  No results found for this or any previous visit (from the past 240 hour(s)).       Radiology Studies: Ct Abdomen Pelvis W Contrast  Result Date: 05/17/2017 CLINICAL DATA:  Bowel obstruction. EXAM: CT ABDOMEN AND PELVIS WITH CONTRAST TECHNIQUE: Multidetector CT imaging of the abdomen and pelvis was performed using the standard protocol following bolus administration of intravenous contrast. CONTRAST:  137mL ISOVUE-300 IOPAMIDOL (ISOVUE-300) INJECTION 61% COMPARISON:  11/01/2015 FINDINGS: Lower chest: No acute abnormality. Coronary artery calcifications in the visualized left anterior descending and right coronary artery. Hepatobiliary: No focal hepatic abnormality. Gallbladder unremarkable. Pancreas: No focal abnormality or ductal dilatation. Spleen: No focal abnormality.  Normal size. Adrenals/Urinary Tract: No adrenal abnormality. No focal renal abnormality. No stones or hydronephrosis. Urinary bladder is unremarkable. Stomach/Bowel: Few scattered sigmoid diverticula. No active diverticulitis. There are dilated small bowel loops in the abdomen pelvis. Distal small bowel loops decompressed. Transition appears to be in the right lower quadrant on image 32 of coronal series 6. No visible cause, presumably stricture or adhesions. Stomach unremarkable. Vascular/Lymphatic: Scattered aortic and iliac calcifications. No evidence of aneurysm or adenopathy. Reproductive: Prior hysterectomy.  No adnexal masses. Other: No free fluid or free air. Musculoskeletal: No acute bony abnormality. IMPRESSION: Low to mid grade distal partial small bowel obstruction with transition in the mid to distal ileum without obvious cause, presumably stricture or adhesions.  Electronically Signed   By: Rolm Baptise M.D.   On: 05/17/2017 15:32        Scheduled Meds: . amLODipine  5 mg Oral Daily  . aspirin EC  81 mg Oral Daily  . atorvastatin  10 mg Oral Daily  . enoxaparin (LOVENOX) injection  40 mg Subcutaneous Q24H  . losartan  50 mg Oral Daily  . pantoprazole  40 mg Oral Daily  . polyethylene glycol  17 g Oral Daily   Continuous Infusions: . piperacillin-tazobactam (ZOSYN)  IV 3.375 g (05/18/17 1340)     LOS: 0 days    Time spent: 5mins    Kathie Dike, MD [facility] Pager [phone]  If 7PM-7AM, please contact night-coverage [email or web address] Password [email or web address] 05/18/2017, 2:35 PM

## 2017-05-18 NOTE — Care Management Note (Signed)
Case Management Note  Patient Details  Name: [name] [name] MRN: [id number] Date of Birth: 04-30-1946  Subjective/Objective:     Admitted with SBO. Pt seen to deliver MOON. Pt is from home, has PCP, transportation and insurance with drug coverage. She has no HH or DME needs. She communicates no concerns about her DC plan.                Action/Plan: DC home with self care. No CM needs noted.   Expected Discharge Date:     05/19/2017             Expected Discharge Plan:  Home/Self Care  In-House Referral:  NA  Discharge planning Services  NA  Post Acute Care Choice:  NA Choice offered to:  NA  Status of Service:  Completed, signed off  Sherald Barge, RN 05/18/2017, 1:02 PM

## 2017-05-18 NOTE — Consult Note (Signed)
Reason for Consult: Partial small bowel obstruction Referring Physician: Dr. Elta Guadeloupe is an 71 y.o. female.  HPI: Patient is a 71 year old white female status post exploratory laparotomy in 2014 by myself for recurrent bowel obstructions who presents with a less than 24-hour history of worsening abdominal discomfort and nausea.  She had one episode of vomiting yesterday morning, but has not had any since.  She states that similar to her previous episodes of obstruction, which have resolved without surgical intervention.  Her last bowel obstruction episode was in 2017.  CT scan of the abdomen revealed a partial small bowel obstruction most likely secondary to adhesive disease.  Patient denies any fever or chills.  She denies any dysuria or cough.  She currently has 0 out of 10 pain.  She is passing flatus.  She is hungry.  No nausea or vomiting is noted at the present time.  Past Medical History:  Diagnosis Date  . Bowel obstruction ([place])   . H/O hiatal hernia   . Hyperlipidemia   . Hypertension     Past Surgical History:  Procedure Laterality Date  . ABDOMINAL HYSTERECTOMY    . APPENDECTOMY    . COLONOSCOPY  03/18/2012   Procedure: COLONOSCOPY;  Surgeon: Rogene Houston, MD;  Location: [facility];  Service: Endoscopy;  Laterality: N/A;  930  . LAPAROTOMY N/A 02/14/2013   Procedure: EXPLORATORY LAPAROTOMY;  Surgeon: Jamesetta So, MD;  Location: [facility] ORS;  Service: General;  Laterality: N/A;    Family History  Problem Relation Age of Onset  . Cancer Unknown     Social History:  reports that  has never smoked. She does not have any smokeless tobacco history on file. She reports that she does not drink alcohol or use drugs.  Allergies: No Known Allergies  Medications: I have reviewed the patient's current medications.  Results for orders placed or performed during the hospital encounter of 05/17/17 (from the past 48 hour(s))  Lipase, blood     Status: None   Collection Time: 05/17/17  1:35 PM  Result Value Ref Range   Lipase 33 11 - 51 U/L  Comprehensive metabolic panel     Status: Abnormal   Collection Time: 05/17/17  1:35 PM  Result Value Ref Range   Sodium 138 135 - 145 mmol/L   Potassium 3.7 3.5 - 5.1 mmol/L   Chloride 103 101 - 111 mmol/L   CO2 27 22 - 32 mmol/L   Glucose, Bld 139 (H) 65 - 99 mg/dL   BUN 17 6 - 20 mg/dL   Creatinine, Ser 0.70 0.44 - 1.00 mg/dL   Calcium 9.3 8.9 - 10.3 mg/dL   Total Protein 7.6 6.5 - 8.1 g/dL   Albumin 4.3 3.5 - 5.0 g/dL   AST 33 15 - 41 U/L   ALT 29 14 - 54 U/L   Alkaline Phosphatase 77 38 - 126 U/L   Total Bilirubin 1.2 0.3 - 1.2 mg/dL   GFR calc non Af Amer >60 >60 mL/min   GFR calc Af Amer >60 >60 mL/min    Comment: (NOTE) The eGFR has been calculated using the CKD EPI equation. This calculation has not been validated in all clinical situations. eGFR's persistently <60 mL/min signify possible Chronic Kidney Disease.    Anion gap 8 5 - 15  CBC     Status: Abnormal   Collection Time: 05/17/17  1:35 PM  Result Value Ref Range   WBC 20.9 (H) 4.0 -  10.5 K/uL   RBC 4.70 3.87 - 5.11 MIL/uL   Hemoglobin 14.9 12.0 - 15.0 g/dL   HCT 44.9 36.0 - 46.0 %   MCV 95.5 78.0 - 100.0 fL   MCH 31.7 26.0 - 34.0 pg   MCHC 33.2 30.0 - 36.0 g/dL   RDW 12.3 11.5 - 15.5 %   Platelets 294 150 - 400 K/uL  Urinalysis, Routine w reflex microscopic     Status: Abnormal   Collection Time: 05/17/17 10:17 PM  Result Value Ref Range   Color, Urine YELLOW YELLOW   APPearance CLEAR CLEAR   Specific Gravity, Urine >1.046 (H) 1.005 - 1.030   pH 6.0 5.0 - 8.0   Glucose, UA NEGATIVE NEGATIVE mg/dL   Hgb urine dipstick NEGATIVE NEGATIVE   Bilirubin Urine NEGATIVE NEGATIVE   Ketones, ur NEGATIVE NEGATIVE mg/dL   Protein, ur 30 (A) NEGATIVE mg/dL   Nitrite NEGATIVE NEGATIVE   Leukocytes, UA NEGATIVE NEGATIVE   RBC / HPF 0-5 0 - 5 RBC/hpf   WBC, UA 6-30 0 - 5 WBC/hpf   Bacteria, UA RARE (A) NONE SEEN   Squamous  Epithelial / LPF 0-5 (A) NONE SEEN   Mucus PRESENT   Comprehensive metabolic panel     Status: Abnormal   Collection Time: 05/18/17  6:25 AM  Result Value Ref Range   Sodium 139 135 - 145 mmol/L   Potassium 3.2 (L) 3.5 - 5.1 mmol/L   Chloride 106 101 - 111 mmol/L   CO2 27 22 - 32 mmol/L   Glucose, Bld 95 65 - 99 mg/dL   BUN 15 6 - 20 mg/dL   Creatinine, Ser 0.57 0.44 - 1.00 mg/dL   Calcium 8.5 (L) 8.9 - 10.3 mg/dL   Total Protein 5.5 (L) 6.5 - 8.1 g/dL   Albumin 3.0 (L) 3.5 - 5.0 g/dL   AST 23 15 - 41 U/L   ALT 19 14 - 54 U/L   Alkaline Phosphatase 52 38 - 126 U/L   Total Bilirubin 1.3 (H) 0.3 - 1.2 mg/dL   GFR calc non Af Amer >60 >60 mL/min   GFR calc Af Amer >60 >60 mL/min    Comment: (NOTE) The eGFR has been calculated using the CKD EPI equation. This calculation has not been validated in all clinical situations. eGFR's persistently <60 mL/min signify possible Chronic Kidney Disease.    Anion gap 6 5 - 15  CBC     Status: Abnormal   Collection Time: 05/18/17  6:25 AM  Result Value Ref Range   WBC 13.4 (H) 4.0 - 10.5 K/uL   RBC 3.98 3.87 - 5.11 MIL/uL   Hemoglobin 12.5 12.0 - 15.0 g/dL   HCT 38.5 36.0 - 46.0 %   MCV 96.7 78.0 - 100.0 fL   MCH 31.4 26.0 - 34.0 pg   MCHC 32.5 30.0 - 36.0 g/dL   RDW 12.6 11.5 - 15.5 %   Platelets 264 150 - 400 K/uL  Glucose, capillary     Status: None   Collection Time: 05/18/17  7:19 AM  Result Value Ref Range   Glucose-Capillary 86 65 - 99 mg/dL    Ct Abdomen Pelvis W Contrast  Result Date: 05/17/2017 CLINICAL DATA:  Bowel obstruction. EXAM: CT ABDOMEN AND PELVIS WITH CONTRAST TECHNIQUE: Multidetector CT imaging of the abdomen and pelvis was performed using the standard protocol following bolus administration of intravenous contrast. CONTRAST:  1103m ISOVUE-300 IOPAMIDOL (ISOVUE-300) INJECTION 61% COMPARISON:  11/01/2015 FINDINGS: Lower chest: No acute abnormality.  Coronary artery calcifications in the visualized left anterior  descending and right coronary artery. Hepatobiliary: No focal hepatic abnormality. Gallbladder unremarkable. Pancreas: No focal abnormality or ductal dilatation. Spleen: No focal abnormality.  Normal size. Adrenals/Urinary Tract: No adrenal abnormality. No focal renal abnormality. No stones or hydronephrosis. Urinary bladder is unremarkable. Stomach/Bowel: Few scattered sigmoid diverticula. No active diverticulitis. There are dilated small bowel loops in the abdomen pelvis. Distal small bowel loops decompressed. Transition appears to be in the right lower quadrant on image 32 of coronal series 6. No visible cause, presumably stricture or adhesions. Stomach unremarkable. Vascular/Lymphatic: Scattered aortic and iliac calcifications. No evidence of aneurysm or adenopathy. Reproductive: Prior hysterectomy.  No adnexal masses. Other: No free fluid or free air. Musculoskeletal: No acute bony abnormality. IMPRESSION: Low to mid grade distal partial small bowel obstruction with transition in the mid to distal ileum without obvious cause, presumably stricture or adhesions. Electronically Signed   By: Rolm Baptise M.D.   On: 05/17/2017 15:32    ROS:  Pertinent items are noted in HPI.  Blood pressure (!) 146/72, pulse 70, temperature 98.4 F (36.9 C), temperature source Oral, resp. rate 18, height '5\' 4"'  (1.626 m), weight 180 lb (81.6 kg), last menstrual period 09/10/2014, SpO2 95 %. Physical Exam: Pleasant well-developed well-nourished white female no acute distress Head is normocephalic, atraumatic Lungs clear to auscultation with equal breath sounds bilaterally Heart examination reveals regular rate and rhythm without S3, S4, murmurs Abdomen soft, nontender, nondistended.  Minimal bowel sounds appreciated.  No rigidity noted.  No obvious hernias noted.  CT scan images personally reviewed  Assessment/Plan: Impression: Partial small bowel obstruction, resolving.  Patient does not need acute surgical  intervention at this time.  Leukocytosis resolving.  Question UTI. Plan: Will advance to full liquid diet.  Continue IV Zosyn.  Recheck CBC in a.m.  Aviva Signs 05/18/2017, 9:01 AM

## 2017-05-18 NOTE — Care Management Obs Status (Signed)
[facility] NOTIFICATION   Patient Details  Name: MORAYMA GODOWN MRN: [id number] Date of Birth: [birth year]   Medicare Observation Status Notification Given:  Yes    Sherald Barge, RN 05/18/2017, 9:25 AM

## 2017-05-19 ENCOUNTER — Encounter (HOSPITAL_COMMUNITY): Payer: Self-pay | Admitting: *Deleted

## 2017-05-19 DIAGNOSIS — K56600 Partial intestinal obstruction, unspecified as to cause: Secondary | ICD-10-CM | POA: Diagnosis not present

## 2017-05-19 DIAGNOSIS — K56609 Unspecified intestinal obstruction, unspecified as to partial versus complete obstruction: Secondary | ICD-10-CM | POA: Diagnosis not present

## 2017-05-19 DIAGNOSIS — K219 Gastro-esophageal reflux disease without esophagitis: Secondary | ICD-10-CM | POA: Diagnosis not present

## 2017-05-19 LAB — BASIC METABOLIC PANEL
ANION GAP: 8 (ref 5–15)
BUN: 11 mg/dL (ref 6–20)
CALCIUM: 8.7 mg/dL — AB (ref 8.9–10.3)
CO2: 27 mmol/L (ref 22–32)
Chloride: 107 mmol/L (ref 101–111)
Creatinine, Ser: 0.63 mg/dL (ref 0.44–1.00)
GFR calc non Af Amer: >60 mL/min (ref >60)
Glucose, Bld: 96 mg/dL (ref 65–99)
Potassium: 4 mmol/L (ref 3.5–5.1)
Sodium: 142 mmol/L (ref 135–145)

## 2017-05-19 LAB — CBC
HEMATOCRIT: 39.6 % (ref 36.0–46.0)
Hemoglobin: 12.9 g/dL (ref 12.0–15.0)
MCH: 31.6 pg (ref 26.0–34.0)
MCHC: 32.6 g/dL (ref 30.0–36.0)
MCV: 97.1 fL (ref 78.0–100.0)
Platelets: 244 10*3/uL (ref 150–400)
RBC: 4.08 MIL/uL (ref 3.87–5.11)
RDW: 12.4 % (ref 11.5–15.5)
WBC: 7.9 10*3/uL (ref 4.0–10.5)

## 2017-05-19 LAB — GLUCOSE, CAPILLARY: GLUCOSE-CAPILLARY: 93 mg/dL (ref 65–99)

## 2017-05-19 NOTE — Discharge Summary (Signed)
Physician Discharge Summary  [name] [name] LOV:[id number] DOB: June 29, 1945 DOA: 05/17/2017  PCP: Sharilyn Sites, MD  Admit date: 05/17/2017 Discharge date: 05/19/2017  Time spent: 45 minutes  Recommendations for Outpatient Follow-up:  -Patient will be discharged home today. -Advised to follow-up with primary care provider in 2 weeks.  Discharge Diagnoses:  Principal Problem:   Partial small bowel obstruction (HCC) Active Problems:   Fatty liver   Leukocytosis   Small bowel obstruction (HCC)   Hypertension   Hyperlipidemia   GERD (gastroesophageal reflux disease)   Discharge Condition: Stable and improved  Filed Weights   05/17/17 1113 05/17/17 1844  Weight: 81.6 kg (180 lb) 81.6 kg (180 lb)    History of present illness:  As per Dr. Manuella Ghazi on 12/16: [name] [name] is a 71 y.o. female with medical history significant for repeat small bowel obstructions, dyslipidemia, and hypertension who presents to the emergency department today after abdominal pain that began at approximately 4 AM this morning.  She describes the pain as being generalized with no other radiation.  She states that is diffuse and achy.  She is currently passing flatus and did actually have a small bowel movement this morning.  She has been feeling nauseous and had one episode of vomiting at 9:30 AM and has continued to have some dry heaves.  She was actually able to tolerate having some coffee earlier today.  She states that her symptoms are currently similar to her prior small bowel obstruction.  She denies any hematemesis, blood on her stools, fever, chills, or recent weight change.   ED Course: Vital signs are stable.  Laboratory data demonstrates a blood glucose of 139 and leukocytosis of 20,900.  CT of abdomen and pelvis with contrast demonstrates low to mid grade distal partial small bowel obstruction with transition in the mid to distal ileum without obvious cause.  General surgery was spoken to  by the ED physician and they agreed to see patient in a.m.    Hospital Course:   Partial small bowel obstruction -Has clinically resolved.  Is tolerating a solid diet, has had a bowel movement. -Leukocytosis has resolved, since we do not exactly have a source of infection will elect to not continue antibiotics on discharge.  GERD -Continue PPI  Hyperlipidemia -Continue Lipitor.  Benign essential hypertension -Well-controlled. -Continue Norvasc and Cozaar.   Procedures:  None  Consultations:  General surgery  Discharge Instructions  Discharge Instructions    Diet - low sodium heart healthy   Complete by:  As directed    Increase activity slowly   Complete by:  As directed      Allergies as of 05/19/2017   No Known Allergies     Medication List    TAKE these medications   amLODipine 5 MG tablet Commonly known as:  NORVASC Take 5 mg by mouth daily.   aspirin EC 81 MG tablet Take 81 mg by mouth daily.   atorvastatin 10 MG tablet Commonly known as:  LIPITOR Take 10 mg by mouth daily.   beta carotene w/minerals tablet Take 1 tablet by mouth daily. What changed:  Another medication with the same name was removed. Continue taking this medication, and follow the directions you see here.   CALCIUM 600 + D PO Take 1 tablet by mouth 2 (two) times daily.   cholecalciferol 1000 units tablet Commonly known as:  VITAMIN D Take 10,000 Units by mouth daily.   CULTURELLE DIGESTIVE HEALTH PO Take 1 capsule by  mouth daily.   DSS 100 MG Caps Take 100 mg by mouth daily. What changed:  when to take this   FISH OIL + D3 1000-1000 MG-UNIT Caps Take 1 capsule by mouth 2 (two) times daily.   losartan 50 MG tablet Commonly known as:  COZAAR Take 50 mg by mouth daily.   multivitamin with minerals Tabs tablet Take 1 tablet by mouth daily.   omeprazole 40 MG capsule Commonly known as:  PRILOSEC Take 40 mg by mouth daily.   OSTEO BI-FLEX ADV DOUBLE ST PO Take 1  tablet by mouth 2 (two) times daily.   polyethylene glycol powder powder Commonly known as:  GLYCOLAX/MIRALAX Take 17 g by mouth daily.   Potassium 99 MG Tabs Take 1 tablet by mouth daily.      No Known Allergies Follow-up Information    Sharilyn Sites, MD. Schedule an appointment as soon as possible for a visit in 2 week(s).   Specialty:  Family Medicine Contact information: [address] [place] [address] [phone]            The results of significant diagnostics from this hospitalization (including imaging, microbiology, ancillary and laboratory) are listed below for reference.    Significant Diagnostic Studies: Ct Abdomen Pelvis W Contrast  Result Date: 05/17/2017 CLINICAL DATA:  Bowel obstruction. EXAM: CT ABDOMEN AND PELVIS WITH CONTRAST TECHNIQUE: Multidetector CT imaging of the abdomen and pelvis was performed using the standard protocol following bolus administration of intravenous contrast. CONTRAST:  133mL ISOVUE-300 IOPAMIDOL (ISOVUE-300) INJECTION 61% COMPARISON:  11/01/2015 FINDINGS: Lower chest: No acute abnormality. Coronary artery calcifications in the visualized left anterior descending and right coronary artery. Hepatobiliary: No focal hepatic abnormality. Gallbladder unremarkable. Pancreas: No focal abnormality or ductal dilatation. Spleen: No focal abnormality.  Normal size. Adrenals/Urinary Tract: No adrenal abnormality. No focal renal abnormality. No stones or hydronephrosis. Urinary bladder is unremarkable. Stomach/Bowel: Few scattered sigmoid diverticula. No active diverticulitis. There are dilated small bowel loops in the abdomen pelvis. Distal small bowel loops decompressed. Transition appears to be in the right lower quadrant on image 32 of coronal series 6. No visible cause, presumably stricture or adhesions. Stomach unremarkable. Vascular/Lymphatic: Scattered aortic and iliac calcifications. No evidence of aneurysm or adenopathy.  Reproductive: Prior hysterectomy.  No adnexal masses. Other: No free fluid or free air. Musculoskeletal: No acute bony abnormality. IMPRESSION: Low to mid grade distal partial small bowel obstruction with transition in the mid to distal ileum without obvious cause, presumably stricture or adhesions. Electronically Signed   By: Rolm Baptise M.D.   On: 05/17/2017 15:32    Microbiology: No results found for this or any previous visit (from the past 240 hour(s)).   Labs: Basic Metabolic Panel: Recent Labs  Lab 05/17/17 1335 05/18/17 0625 05/19/17 0426  NA 138 139 142  K 3.7 3.2* 4.0  CL 103 106 107  CO2 27 27 27   GLUCOSE 139* 95 96  BUN 17 15 11   CREATININE 0.70 0.57 0.63  CALCIUM 9.3 8.5* 8.7*   Liver Function Tests: Recent Labs  Lab 05/17/17 1335 05/18/17 0625  AST 33 23  ALT 29 19  ALKPHOS 77 52  BILITOT 1.2 1.3*  PROT 7.6 5.5*  ALBUMIN 4.3 3.0*   Recent Labs  Lab 05/17/17 1335  LIPASE 33   No results for input(s): AMMONIA in the last 168 hours. CBC: Recent Labs  Lab 05/17/17 1335 05/18/17 0625 05/19/17 0426  WBC 20.9* 13.4* 7.9  HGB 14.9 12.5 12.9  HCT 44.9  38.5 39.6  MCV 95.5 96.7 97.1  PLT 294 264 244   Cardiac Enzymes: No results for input(s): CKTOTAL, CKMB, CKMBINDEX, TROPONINI in the last 168 hours. BNP: BNP (last 3 results) No results for input(s): BNP in the last 8760 hours.  ProBNP (last 3 results) No results for input(s): PROBNP in the last 8760 hours.  CBG: Recent Labs  Lab 05/18/17 0719 05/19/17 0744  GLUCAP 86 93       Signed:  [facility] Pager: [phone] 05/19/2017, 2:48 PM

## 2017-05-19 NOTE — Progress Notes (Signed)
Patient ambulated around the floor several times,  atleast 500 feet with no oxygen or nor assistance.  Patient tolerated ambulation well.

## 2017-05-19 NOTE — Plan of Care (Signed)
Pt  stated that she had a large bowel movement this morning at 0800

## 2017-05-19 NOTE — Progress Notes (Signed)
Removed IV-clean, dry, and intact. Reviewed d/c paperwork with pt including home meds home care. Walked stable pt and belongings to car where she meet her daughter.

## 2017-05-19 NOTE — Progress Notes (Signed)
  Subjective: Patient denies any abdominal pain.  Tolerating soft diet well.  Has had a bowel movement.  Would like to be discharged.  Objective: Vital signs in last 24 hours: Temp:  [97.8 F (36.6 C)-98 F (36.7 C)] 98 F (36.7 C) (12/18 0653) Pulse Rate:  [60-65] 64 (12/18 0653) Resp:  [18-20] 20 (12/18 0653) BP: (120-155)/(59-86) 124/86 (12/18 0653) SpO2:  [96 %-99 %] 97 % (12/18 0653) Last BM Date: 05/18/17  Intake/Output from previous day: 12/17 0701 - 12/18 0700 In: 290 [P.O.:240; IV Piggyback:50] Out: 2400 [Urine:2400] Intake/Output this shift: No intake/output data recorded.  General appearance: alert, cooperative and no distress GI: soft, non-tender; bowel sounds normal; no masses,  no organomegaly  Lab Results:  Recent Labs    05/18/17 0625 05/19/17 0426  WBC 13.4* 7.9  HGB 12.5 12.9  HCT 38.5 39.6  PLT 264 244   BMET Recent Labs    05/18/17 0625 05/19/17 0426  NA 139 142  K 3.2* 4.0  CL 106 107  CO2 27 27  GLUCOSE 95 96  BUN 15 11  CREATININE 0.57 0.63  CALCIUM 8.5* 8.7*   PT/INR No results for input(s): LABPROT, INR in the last 72 hours.  Studies/Results: Ct Abdomen Pelvis W Contrast  Result Date: 05/17/2017 CLINICAL DATA:  Bowel obstruction. EXAM: CT ABDOMEN AND PELVIS WITH CONTRAST TECHNIQUE: Multidetector CT imaging of the abdomen and pelvis was performed using the standard protocol following bolus administration of intravenous contrast. CONTRAST:  154mL ISOVUE-300 IOPAMIDOL (ISOVUE-300) INJECTION 61% COMPARISON:  11/01/2015 FINDINGS: Lower chest: No acute abnormality. Coronary artery calcifications in the visualized left anterior descending and right coronary artery. Hepatobiliary: No focal hepatic abnormality. Gallbladder unremarkable. Pancreas: No focal abnormality or ductal dilatation. Spleen: No focal abnormality.  Normal size. Adrenals/Urinary Tract: No adrenal abnormality. No focal renal abnormality. No stones or hydronephrosis. Urinary  bladder is unremarkable. Stomach/Bowel: Few scattered sigmoid diverticula. No active diverticulitis. There are dilated small bowel loops in the abdomen pelvis. Distal small bowel loops decompressed. Transition appears to be in the right lower quadrant on image 32 of coronal series 6. No visible cause, presumably stricture or adhesions. Stomach unremarkable. Vascular/Lymphatic: Scattered aortic and iliac calcifications. No evidence of aneurysm or adenopathy. Reproductive: Prior hysterectomy.  No adnexal masses. Other: No free fluid or free air. Musculoskeletal: No acute bony abnormality. IMPRESSION: Low to mid grade distal partial small bowel obstruction with transition in the mid to distal ileum without obvious cause, presumably stricture or adhesions. Electronically Signed   By: Rolm Baptise M.D.   On: 05/17/2017 15:32    Anti-infectives: Anti-infectives (From admission, onward)   Start     Dose/Rate Route Frequency Ordered Stop   05/17/17 1700  piperacillin-tazobactam (ZOSYN) IVPB 3.375 g     3.375 g 12.5 mL/hr over 240 Minutes Intravenous Every 8 hours 05/17/17 1657        Assessment/Plan: Impression: Partial small bowel obstruction, resolved.  No need for surgical intervention. Plan: Okay for discharge from surgery standpoint.  Follow-up with me as needed.  LOS: 0 days    Aviva Signs 05/19/2017

## 2017-05-22 DIAGNOSIS — K5669 Other partial intestinal obstruction: Secondary | ICD-10-CM | POA: Diagnosis not present

## 2017-05-22 DIAGNOSIS — E876 Hypokalemia: Secondary | ICD-10-CM | POA: Diagnosis not present

## 2017-05-22 DIAGNOSIS — D72829 Elevated white blood cell count, unspecified: Secondary | ICD-10-CM | POA: Diagnosis not present

## 2017-05-22 DIAGNOSIS — E6609 Other obesity due to excess calories: Secondary | ICD-10-CM | POA: Diagnosis not present

## 2017-05-22 DIAGNOSIS — Z6831 Body mass index (BMI) 31.0-31.9, adult: Secondary | ICD-10-CM | POA: Diagnosis not present

## 2017-05-22 DIAGNOSIS — R7309 Other abnormal glucose: Secondary | ICD-10-CM | POA: Diagnosis not present

## 2017-05-28 DIAGNOSIS — K5669 Other partial intestinal obstruction: Secondary | ICD-10-CM | POA: Diagnosis not present

## 2017-05-28 DIAGNOSIS — Z1389 Encounter for screening for other disorder: Secondary | ICD-10-CM | POA: Diagnosis not present

## 2017-05-28 DIAGNOSIS — R7309 Other abnormal glucose: Secondary | ICD-10-CM | POA: Diagnosis not present

## 2017-05-28 DIAGNOSIS — D72829 Elevated white blood cell count, unspecified: Secondary | ICD-10-CM | POA: Diagnosis not present

## 2017-05-28 LAB — CBC AND DIFFERENTIAL
HEMATOCRIT: 41 (ref 36–46)
Hemoglobin: 13.9 (ref 12.0–16.0)
Platelets: 282 (ref 150–399)
WBC: 7.7

## 2017-05-28 LAB — BASIC METABOLIC PANEL
BUN: 12 (ref 4–21)
Creatinine: 0.7 (ref 0.5–1.1)
GLUCOSE: 94
Potassium: 4.4 (ref 3.4–5.3)
Sodium: 143 (ref 137–147)

## 2017-05-28 LAB — HEMOGLOBIN A1C: HEMOGLOBIN A1C: 5.6

## 2017-09-28 ENCOUNTER — Other Ambulatory Visit: Payer: Self-pay | Admitting: Family Medicine

## 2017-09-28 DIAGNOSIS — Z1231 Encounter for screening mammogram for malignant neoplasm of breast: Secondary | ICD-10-CM

## 2017-11-25 ENCOUNTER — Ambulatory Visit: Payer: Medicare Other

## 2017-12-01 ENCOUNTER — Ambulatory Visit
Admission: RE | Admit: 2017-12-01 | Discharge: 2017-12-01 | Disposition: A | Discharge status: Home / Self Care | Payer: Medicare Other | Source: Ambulatory Visit | Attending: Family Medicine | Admitting: Family Medicine

## 2017-12-01 DIAGNOSIS — Z1231 Encounter for screening mammogram for malignant neoplasm of breast: Secondary | ICD-10-CM | POA: Diagnosis not present

## 2017-12-15 DIAGNOSIS — H04123 Dry eye syndrome of bilateral lacrimal glands: Secondary | ICD-10-CM | POA: Diagnosis not present

## 2017-12-15 DIAGNOSIS — H43811 Vitreous degeneration, right eye: Secondary | ICD-10-CM | POA: Diagnosis not present

## 2017-12-15 DIAGNOSIS — H31092 Other chorioretinal scars, left eye: Secondary | ICD-10-CM | POA: Diagnosis not present

## 2017-12-15 DIAGNOSIS — Z961 Presence of intraocular lens: Secondary | ICD-10-CM | POA: Diagnosis not present

## 2017-12-15 DIAGNOSIS — H35342 Macular cyst, hole, or pseudohole, left eye: Secondary | ICD-10-CM | POA: Diagnosis not present

## 2018-01-12 DIAGNOSIS — H9201 Otalgia, right ear: Secondary | ICD-10-CM | POA: Diagnosis not present

## 2018-01-12 DIAGNOSIS — Z1389 Encounter for screening for other disorder: Secondary | ICD-10-CM | POA: Diagnosis not present

## 2018-01-12 DIAGNOSIS — E6609 Other obesity due to excess calories: Secondary | ICD-10-CM | POA: Diagnosis not present

## 2018-01-12 DIAGNOSIS — J019 Acute sinusitis, unspecified: Secondary | ICD-10-CM | POA: Diagnosis not present

## 2018-01-12 DIAGNOSIS — H6691 Otitis media, unspecified, right ear: Secondary | ICD-10-CM | POA: Diagnosis not present

## 2018-01-12 DIAGNOSIS — Z6831 Body mass index (BMI) 31.0-31.9, adult: Secondary | ICD-10-CM | POA: Diagnosis not present

## 2018-02-02 DIAGNOSIS — Z1389 Encounter for screening for other disorder: Secondary | ICD-10-CM | POA: Diagnosis not present

## 2018-02-02 DIAGNOSIS — Z0000 Encounter for general adult medical examination without abnormal findings: Secondary | ICD-10-CM | POA: Diagnosis not present

## 2018-02-02 DIAGNOSIS — Z6831 Body mass index (BMI) 31.0-31.9, adult: Secondary | ICD-10-CM | POA: Diagnosis not present

## 2018-02-02 DIAGNOSIS — E6609 Other obesity due to excess calories: Secondary | ICD-10-CM | POA: Diagnosis not present

## 2018-03-23 ENCOUNTER — Ambulatory Visit: Payer: Medicare Other | Admitting: Internal Medicine

## 2018-03-23 ENCOUNTER — Encounter: Payer: Self-pay | Admitting: Nurse Practitioner

## 2018-03-23 ENCOUNTER — Ambulatory Visit (INDEPENDENT_AMBULATORY_CARE_PROVIDER_SITE_OTHER): Payer: Medicare Other | Admitting: Nurse Practitioner

## 2018-03-23 VITALS — BP 122/80 | HR 65 | Temp 98.7°F | Ht 64.0 in | Wt 179.0 lb

## 2018-03-23 DIAGNOSIS — E2839 Other primary ovarian failure: Secondary | ICD-10-CM

## 2018-03-23 DIAGNOSIS — Z23 Encounter for immunization: Secondary | ICD-10-CM | POA: Diagnosis not present

## 2018-03-23 DIAGNOSIS — R739 Hyperglycemia, unspecified: Secondary | ICD-10-CM

## 2018-03-23 DIAGNOSIS — K219 Gastro-esophageal reflux disease without esophagitis: Secondary | ICD-10-CM | POA: Diagnosis not present

## 2018-03-23 DIAGNOSIS — I10 Essential (primary) hypertension: Secondary | ICD-10-CM

## 2018-03-23 DIAGNOSIS — Z8719 Personal history of other diseases of the digestive system: Secondary | ICD-10-CM | POA: Diagnosis not present

## 2018-03-23 DIAGNOSIS — E785 Hyperlipidemia, unspecified: Secondary | ICD-10-CM

## 2018-03-23 MED ORDER — ZOSTER VAC RECOMB ADJUVANTED 50 MCG/0.5ML IM SUSR: 0.5000 mL | Freq: Once | INTRAMUSCULAR | 1 refills | Status: AC

## 2018-03-23 MED ORDER — TETANUS-DIPHTH-ACELL PERTUSSIS 5-2.5-18.5 LF-MCG/0.5 IM SUSP: 0.5000 mL | Freq: Once | INTRAMUSCULAR | 0 refills | Status: AC

## 2018-03-23 NOTE — Progress Notes (Signed)
Careteam: Patient Care Team: [name], NP as PCP - General (Geriatric Medicine) Warden Fillers, MD as Consulting Physician (Ophthalmology)  Advanced Directive information    No Known Allergies  Chief Complaint  Patient presents with  . Medical Management of Chronic Issues    Pt is being seen to establish care. Pt previoius PCP was with [facility] in [place].   . Depression    score of 0  . Audit C Screening    score of 0  . Immunizations    Pt would like to have high dose flu vaccine today     HPI: Patient is a 72 y.o. female seen in the office today to establish care.  Has AWV around august/september of this year.   htn- taking norvasc 5 mg, losartan 50 mg by mouth daily with ASA 81 mg daily   GERD- using omeprazole 40 mg daily which controls symptoms  Constipation- using miralax 17 g, using 1/2 dose BID to prevent bowel obstruction. Also using probiotic daily  Lot of dental work- implant post- using amoxicillin due to this.   Hyperlipidemia- lipitor 10 mg daily  Using supplements for bone health calcium and vit d BID   Using vit d 10000  Daily for immunity  Hx of cataracts and macular taking occuvite daily   Taking fish oil but unsure why she is really taking it. Also taking potassium "just cause"   Using osteo-biflex for joints to prevent arthritis, walks a lot   Walking at a fast pace 1 hour (4 miles/hr) 4-5 times a week Review of Systems:  Review of Systems  Constitutional: Negative for chills, fever and weight loss.  HENT: Negative for tinnitus.   Eyes:       Dry eyes, wears glasses  Respiratory: Negative for cough, sputum production and shortness of breath.   Cardiovascular: Negative for chest pain, palpitations and leg swelling.  Gastrointestinal: Negative for abdominal pain, constipation, diarrhea and heartburn.  Genitourinary: Negative for dysuria, frequency and urgency.  Musculoskeletal: Negative for back pain, falls,  joint pain and myalgias.  Skin: Negative.   Neurological: Negative for dizziness and headaches.  Psychiatric/Behavioral: Negative for depression and memory loss. The patient does not have insomnia.     Past Medical History:  Diagnosis Date  . Acid reflux   . Bowel obstruction ([place])   . H/O hiatal hernia   . Hyperlipidemia   . Hypertension    Past Surgical History:  Procedure Laterality Date  . ABDOMINAL HYSTERECTOMY    . APPENDECTOMY    . COLONOSCOPY  03/18/2012   Procedure: COLONOSCOPY;  Surgeon: Rogene Houston, MD;  Location: [facility];  Service: Endoscopy;  Laterality: N/A;  930  . LAPAROTOMY N/A 02/14/2013   Procedure: EXPLORATORY LAPAROTOMY;  Surgeon: Jamesetta So, MD;  Location: [facility] ORS;  Service: General;  Laterality: N/A;   Social History:   reports that she has never smoked. She has never used smokeless tobacco. She reports that she does not drink alcohol or use drugs.  Family History  Problem Relation Age of Onset  . Cancer - Other Mother        Kidney  . Cancer - Lung Father 63  . Heart disease Brother 50       had heart transplant then died 3 years later after restarting smoking    Medications: Patient's Medications  New Prescriptions   No medications on file  Previous Medications   AMLODIPINE (NORVASC) 5 MG TABLET  Take 5 mg by mouth daily.   AMOXICILLIN (AMOXIL) 500 MG TABLET    Take 500 mg by mouth 3 (three) times daily. For dental procedure   ASPIRIN EC 81 MG TABLET    Take 81 mg by mouth daily.   ATORVASTATIN (LIPITOR) 10 MG TABLET    Take 10 mg by mouth daily.   BETA CAROTENE W/MINERALS (OCUVITE) TABLET    Take 1 tablet by mouth daily.   CALCIUM CARBONATE-VITAMIN D (CALCIUM 600 + D PO)    Take 1 tablet by mouth 2 (two) times daily.   CHOLECALCIFEROL (VITAMIN D) 1000 UNITS TABLET    Take 10,000 Units by mouth daily.    DOCUSATE SODIUM (COLACE) 100 MG CAPSULE    Take 100 mg by mouth 2 (two) times daily.   FISH OIL-CHOLECALCIFEROL (FISH OIL + D3)  1000-1000 MG-UNIT CAPS    Take 1 capsule by mouth 2 (two) times daily.   LACTOBACILLUS-INULIN (CULTURELLE DIGESTIVE HEALTH PO)    Take 1 capsule by mouth daily.    LOSARTAN (COZAAR) 50 MG TABLET    Take 50 mg by mouth daily.   MISC NATURAL PRODUCTS (OSTEO BI-FLEX ADV DOUBLE ST PO)    Take 1 tablet by mouth 2 (two) times daily.   MULTIPLE VITAMIN (MULTIVITAMIN WITH MINERALS) TABS    Take 1 tablet by mouth daily.   OMEPRAZOLE (PRILOSEC) 40 MG CAPSULE    Take 40 mg by mouth daily.   POLYETHYLENE GLYCOL POWDER (GLYCOLAX/MIRALAX) POWDER    Take 17 g by mouth daily.   POTASSIUM GLUCONATE 595 (99 K) MG TABS TABLET    Take 595 mg by mouth daily.  Modified Medications   Modified Medication Previous Medication   TDAP (BOOSTRIX) 5-2.5-18.5 LF-MCG/0.5 INJECTION Tdap (BOOSTRIX) 5-2.5-18.5 LF-MCG/0.5 injection      Inject 0.5 mLs into the muscle once for 1 dose.    Inject 0.5 mLs into the muscle once.   ZOSTER VACCINE ADJUVANTED [facility]) INJECTION Zoster Vaccine Adjuvanted [facility]) injection      Inject 0.5 mLs into the muscle once for 1 dose.    Inject 0.5 mLs into the muscle once.  Discontinued Medications   No medications on file     Physical Exam:  Vitals:   03/23/18 0846  BP: 122/80  Pulse: 65  Temp: 98.7 F (37.1 C)  TempSrc: Oral  SpO2: 98%  Weight: 179 lb (81.2 kg)  Height: 5\' 4"  (1.626 m)   Body mass index is 30.73 kg/m.  Physical Exam  Constitutional: She is oriented to person, place, and time. She appears well-developed and well-nourished. No distress.  HENT:  Head: Normocephalic and atraumatic.  Mouth/Throat: Oropharynx is clear and moist. No oropharyngeal exudate.  Eyes: Pupils are equal, round, and reactive to light. Conjunctivae are normal.  Neck: Normal range of motion. Neck supple.  Cardiovascular: Normal rate, regular rhythm and normal heart sounds.  Pulmonary/Chest: Effort normal and breath sounds normal.  Abdominal: Soft. Bowel sounds are normal.    Musculoskeletal: She exhibits no edema or tenderness.  Neurological: She is alert and oriented to person, place, and time.  Skin: Skin is warm and dry. She is not diaphoretic.  Psychiatric: She has a normal mood and affect.    Labs reviewed: Basic Metabolic Panel: Recent Labs    05/17/17 1335 05/18/17 0625 05/19/17 0426  NA 138 139 142  K 3.7 3.2* 4.0  CL 103 106 107  CO2 27 27 27   GLUCOSE 139* 95 96  BUN 17 15 11  CREATININE 0.70 0.57 0.63  CALCIUM 9.3 8.5* 8.7*   Liver Function Tests: Recent Labs    05/17/17 1335 05/18/17 0625  AST 33 23  ALT 29 19  ALKPHOS 77 52  BILITOT 1.2 1.3*  PROT 7.6 5.5*  ALBUMIN 4.3 3.0*   Recent Labs    05/17/17 1335  LIPASE 33   No results for input(s): AMMONIA in the last 8760 hours. CBC: Recent Labs    05/17/17 1335 05/18/17 0625 05/19/17 0426  WBC 20.9* 13.4* 7.9  HGB 14.9 12.5 12.9  HCT 44.9 38.5 39.6  MCV 95.5 96.7 97.1  PLT 294 264 244   Lipid Panel: No results for input(s): CHOL, HDL, LDLCALC, TRIG, CHOLHDL, LDLDIRECT in the last 8760 hours. TSH: No results for input(s): TSH in the last 8760 hours. A1C: No results found for: HGBA1C   Assessment/Plan 1. Need for influenza vaccination - Flu vaccine HIGH DOSE PF (Fluzone High dose)  2. Estrogen deficiency - DG Bone Density; Future  3. Essential hypertension Stable on current regimen - CBC with Differential/Platelets; Future  4. Gastroesophageal reflux disease without esophagitis -continues on omeprazole with good effect  5. Hyperglycemia - Hemoglobin A1c; Future  6. Hyperlipidemia, unspecified hyperlipidemia type -on Lipitor, will follow up lab to recheck - Lipid panel; Future - COMPLETE METABOLIC PANEL WITH GFR; Future  7. Hx of small bowel obstruction Recurrent hospitalization, last hospitalization dec 2018, attempting to stay well hydrated, increase activity and bowel regimen to avoid recurrence  Next appt: 1 month for extended visit, had AWV  in aug/sept- will get records  Phuong Hillary K. [place], [facility] [phone]

## 2018-03-23 NOTE — Patient Instructions (Signed)
FOLLOW UP IN 1 month for EV-- labs before visit  (has already has AWV)-- please obtain records from previous PCP

## 2018-04-12 ENCOUNTER — Ambulatory Visit
Admission: RE | Admit: 2018-04-12 | Discharge: 2018-04-12 | Disposition: A | Discharge status: Home / Self Care | Payer: Medicare Other | Source: Ambulatory Visit | Attending: Nurse Practitioner | Admitting: Nurse Practitioner

## 2018-04-12 DIAGNOSIS — E2839 Other primary ovarian failure: Secondary | ICD-10-CM

## 2018-04-12 DIAGNOSIS — Z13820 Encounter for screening for osteoporosis: Secondary | ICD-10-CM | POA: Diagnosis not present

## 2018-04-12 DIAGNOSIS — Z780 Asymptomatic menopausal state: Secondary | ICD-10-CM | POA: Diagnosis not present

## 2018-04-19 ENCOUNTER — Other Ambulatory Visit: Payer: Medicare Other

## 2018-04-19 DIAGNOSIS — E785 Hyperlipidemia, unspecified: Secondary | ICD-10-CM

## 2018-04-19 DIAGNOSIS — I10 Essential (primary) hypertension: Secondary | ICD-10-CM | POA: Diagnosis not present

## 2018-04-19 DIAGNOSIS — R739 Hyperglycemia, unspecified: Secondary | ICD-10-CM

## 2018-04-20 LAB — LIPID PANEL
Cholesterol: 119 mg/dL (ref <200)
HDL: 39 mg/dL — ABNORMAL LOW (ref >50)
LDL CHOLESTEROL (CALC): 56 mg/dL
Non-HDL Cholesterol (Calc): 80 mg/dL (calc) (ref <130)
Total CHOL/HDL Ratio: 3.1 (calc) (ref <5.0)
Triglycerides: 159 mg/dL — ABNORMAL HIGH (ref <150)

## 2018-04-20 LAB — COMPLETE METABOLIC PANEL WITH GFR
AG RATIO: 1.6 (calc) (ref 1.0–2.5)
ALT: 18 U/L (ref 6–29)
AST: 19 U/L (ref 10–35)
Albumin: 4.1 g/dL (ref 3.6–5.1)
Alkaline phosphatase (APISO): 73 U/L (ref 33–130)
BILIRUBIN TOTAL: 0.6 mg/dL (ref 0.2–1.2)
BUN: 11 mg/dL (ref 7–25)
CALCIUM: 9.3 mg/dL (ref 8.6–10.4)
CHLORIDE: 106 mmol/L (ref 98–110)
CO2: 31 mmol/L (ref 20–32)
Creat: 0.64 mg/dL (ref 0.60–0.93)
GFR, EST AFRICAN AMERICAN: 104 mL/min/{1.73_m2} (ref >=60)
GFR, Est Non African American: 90 mL/min/{1.73_m2} (ref >=60)
GLOBULIN: 2.5 g/dL (ref 1.9–3.7)
GLUCOSE: 89 mg/dL (ref 65–99)
Potassium: 4.3 mmol/L (ref 3.5–5.3)
SODIUM: 142 mmol/L (ref 135–146)
TOTAL PROTEIN: 6.6 g/dL (ref 6.1–8.1)

## 2018-04-20 LAB — CBC WITH DIFFERENTIAL/PLATELET
BASOS PCT: 1.4 %
Basophils Absolute: 101 cells/uL (ref 0–200)
EOS ABS: 288 {cells}/uL (ref 15–500)
Eosinophils Relative: 4 %
HCT: 42.3 % (ref 35.0–45.0)
HEMOGLOBIN: 14.2 g/dL (ref 11.7–15.5)
Lymphs Abs: 2549 cells/uL (ref 850–3900)
MCH: 31.4 pg (ref 27.0–33.0)
MCHC: 33.6 g/dL (ref 32.0–36.0)
MCV: 93.6 fL (ref 80.0–100.0)
MONOS PCT: 9.4 %
MPV: 12.1 fL (ref 7.5–12.5)
NEUTROS ABS: 3586 {cells}/uL (ref 1500–7800)
Neutrophils Relative %: 49.8 %
Platelets: 274 10*3/uL (ref 140–400)
RBC: 4.52 10*6/uL (ref 3.80–5.10)
RDW: 12 % (ref 11.0–15.0)
Total Lymphocyte: 35.4 %
WBC mixed population: 677 cells/uL (ref 200–950)
WBC: 7.2 10*3/uL (ref 3.8–10.8)

## 2018-04-20 LAB — HEMOGLOBIN A1C
Hgb A1c MFr Bld: 5.7 % of total Hgb — ABNORMAL HIGH (ref <5.7)
Mean Plasma Glucose: 117 (calc)
eAG (mmol/L): 6.5 (calc)

## 2018-04-23 ENCOUNTER — Encounter: Payer: Self-pay | Admitting: Nurse Practitioner

## 2018-04-23 ENCOUNTER — Ambulatory Visit (INDEPENDENT_AMBULATORY_CARE_PROVIDER_SITE_OTHER): Payer: Medicare Other | Admitting: Nurse Practitioner

## 2018-04-23 VITALS — BP 128/70 | HR 78 | Temp 98.4°F | Ht 64.0 in | Wt 179.0 lb

## 2018-04-23 DIAGNOSIS — E785 Hyperlipidemia, unspecified: Secondary | ICD-10-CM

## 2018-04-23 DIAGNOSIS — Z23 Encounter for immunization: Secondary | ICD-10-CM

## 2018-04-23 DIAGNOSIS — R739 Hyperglycemia, unspecified: Secondary | ICD-10-CM

## 2018-04-23 DIAGNOSIS — K219 Gastro-esophageal reflux disease without esophagitis: Secondary | ICD-10-CM

## 2018-04-23 DIAGNOSIS — I10 Essential (primary) hypertension: Secondary | ICD-10-CM | POA: Diagnosis not present

## 2018-04-23 NOTE — Patient Instructions (Addendum)
To schedule AWV and physical after 02/03/2019.   Follow up with Dr Mariea Clonts for routine follow up in 5 months

## 2018-04-23 NOTE — Progress Notes (Signed)
Careteam: Patient Care Team: [name], NP as PCP - General (Geriatric Medicine) Warden Fillers, MD as Consulting Physician (Ophthalmology)  Advanced Directive information Does Patient Have a Medical Advance Directive?: No  No Known Allergies  Chief Complaint  Patient presents with  . Medical Management of Chronic Issues    Pt is being seen for an extended visit.      HPI: Patient is a 72 y.o. female seen in the office today for extended visit.  Last had physical and AWV on 02/02/18 at prior PCP per records reviewed.   Labs obtained and reviewed with pt during OV  Last OV bone density was ordered- results were normal.   Up to date on mammogram and colonoscopy  Very independent, caring for elderly aunt   Review of Systems:  Review of Systems  Constitutional: Negative for chills, fever and weight loss.  HENT: Negative for tinnitus.   Eyes:       Dry eyes, wears glasses  Respiratory: Negative for cough, sputum production and shortness of breath.   Cardiovascular: Negative for chest pain, palpitations and leg swelling.  Gastrointestinal: Negative for abdominal pain, constipation, diarrhea and heartburn.  Genitourinary: Negative for dysuria, frequency and urgency.  Musculoskeletal: Negative for back pain, falls, joint pain and myalgias.  Skin: Negative.   Neurological: Negative for dizziness and headaches.  Psychiatric/Behavioral: Negative for depression and memory loss. The patient does not have insomnia.    Past Medical History:  Diagnosis Date  . Acid reflux   . Bowel obstruction ([place])   . H/O hiatal hernia   . Hyperlipidemia   . Hypertension    Past Surgical History:  Procedure Laterality Date  . ABDOMINAL HYSTERECTOMY    . APPENDECTOMY    . COLONOSCOPY  03/18/2012   Procedure: COLONOSCOPY;  Surgeon: Rogene Houston, MD;  [facility];  Service: Endoscopy;  Laterality: N/A;  930  . LAPAROTOMY N/A 02/14/2013   Procedure: EXPLORATORY  LAPAROTOMY;  Surgeon: Jamesetta So, MD;  Location: [facility] ORS;  Service: General;  Laterality: N/A;   Social History:   reports that she has never smoked. She has never used smokeless tobacco. She reports that she does not drink alcohol or use drugs.  Family History  Problem Relation Age of Onset  . Cancer - Other Mother        Kidney  . Fibromyalgia Mother   . Arthritis Mother   . Cancer - Lung Father 2  . Heart disease Brother 54       had heart transplant then died 3 years later after restarting smoking    Medications: Patient's Medications  New Prescriptions   No medications on file  Previous Medications   AMLODIPINE (NORVASC) 5 MG TABLET    Take 5 mg by mouth daily.   ASPIRIN EC 81 MG TABLET    Take 81 mg by mouth daily.   ATORVASTATIN (LIPITOR) 10 MG TABLET    Take 10 mg by mouth daily.   BETA CAROTENE W/MINERALS (OCUVITE) TABLET    Take 1 tablet by mouth daily.   CALCIUM CARBONATE-VITAMIN D (CALCIUM 600 + D PO)    Take 1 tablet by mouth 2 (two) times daily.   CHOLECALCIFEROL (VITAMIN D) 1000 UNITS TABLET    Take 5,000 Units by mouth daily.    DOCUSATE SODIUM (COLACE) 100 MG CAPSULE    Take 100 mg by mouth 2 (two) times daily.   FISH OIL-CHOLECALCIFEROL (FISH OIL + D3) 1000-1000 MG-UNIT CAPS  Take 1 capsule by mouth 2 (two) times daily.   LACTOBACILLUS-INULIN (CULTURELLE DIGESTIVE HEALTH PO)    Take 1 capsule by mouth daily.    LOSARTAN (COZAAR) 50 MG TABLET    Take 50 mg by mouth daily.   MISC NATURAL PRODUCTS (OSTEO BI-FLEX ADV DOUBLE ST PO)    Take 1 tablet by mouth 2 (two) times daily.   MULTIPLE VITAMIN (MULTIVITAMIN WITH MINERALS) TABS    Take 1 tablet by mouth daily.   OMEPRAZOLE (PRILOSEC) 40 MG CAPSULE    Take 40 mg by mouth daily.   POLYETHYLENE GLYCOL POWDER (GLYCOLAX/MIRALAX) POWDER    Take 17 g by mouth daily.  Modified Medications   No medications on file  Discontinued Medications   POTASSIUM GLUCONATE 595 (99 K) MG TABS TABLET    Take 595 mg by mouth daily.      Physical Exam:  Vitals:   04/23/18 0858  BP: 128/70  Pulse: 78  Temp: 98.4 F (36.9 C)  TempSrc: Oral  SpO2: 98%  Weight: 179 lb (81.2 kg)  Height: 5\' 4"  (1.626 m)   Body mass index is 30.73 kg/m.  Physical Exam  Constitutional: She is oriented to person, place, and time. She appears well-developed and well-nourished. No distress.  HENT:  Head: Normocephalic and atraumatic.  Mouth/Throat: Oropharynx is clear and moist. No oropharyngeal exudate.  Eyes: Pupils are equal, round, and reactive to light. Conjunctivae are normal.  Neck: Normal range of motion. Neck supple.  Cardiovascular: Normal rate, regular rhythm and normal heart sounds.  Pulmonary/Chest: Effort normal and breath sounds normal.  Abdominal: Soft. Bowel sounds are normal.  Musculoskeletal: She exhibits no edema or tenderness.  Neurological: She is alert and oriented to person, place, and time.  Skin: Skin is warm and dry. She is not diaphoretic.  Psychiatric: She has a normal mood and affect.    Labs reviewed: Basic Metabolic Panel: Recent Labs    05/18/17 0625 05/19/17 0426 05/28/17 04/19/18 0854  NA 139 142 143 142  K 3.2* 4.0 4.4 4.3  CL 106 107  --  106  CO2 27 27  --  31  GLUCOSE 95 96  --  89  BUN 15 11 12 11   CREATININE 0.57 0.63 0.7 0.64  CALCIUM 8.5* 8.7*  --  9.3   Liver Function Tests: Recent Labs    05/17/17 1335 05/18/17 0625 04/19/18 0854  AST 33 23 19  ALT 29 19 18   ALKPHOS 77 52  --   BILITOT 1.2 1.3* 0.6  PROT 7.6 5.5* 6.6  ALBUMIN 4.3 3.0*  --    Recent Labs    05/17/17 1335  LIPASE 33   No results for input(s): AMMONIA in the last 8760 hours. CBC: Recent Labs    05/18/17 0625 05/19/17 0426 05/28/17 04/19/18 0854  WBC 13.4* 7.9 7.7 7.2  NEUTROABS  --   --   --  3,586  HGB 12.5 12.9 13.9 14.2  HCT 38.5 39.6 41 42.3  MCV 96.7 97.1  --  93.6  PLT 264 244 282 274   Lipid Panel: Recent Labs    04/19/18 0854  CHOL 119  HDL 39*  LDLCALC 56  TRIG 159*   CHOLHDL 3.1   TSH: No results for input(s): TSH in the last 8760 hours. A1C: Lab Results  Component Value Date   HGBA1C 5.7 (H) 04/19/2018     Assessment/Plan 1. Essential hypertension -stable, on norvasc and cozaar.   2. Gastroesophageal reflux disease without esophagitis  Controlled on omeprazole.   3. Hyperglycemia A1c at goal at 5.7, to continue dietary modifications and active lifestyle   4. Hyperlipidemia, unspecified hyperlipidemia type LDL at goal at 56, continues on lipitor with lifestyle modifications.   5. Need for pneumococcal vaccine - Pneumococcal polysaccharide vaccine 23-valent greater than or equal to 2yo subcutaneous/IM  Next appt: 5 months with Dr Sharee Holster K. [place], [facility] [phone]

## 2018-07-05 ENCOUNTER — Encounter: Payer: Self-pay | Admitting: Nurse Practitioner

## 2018-07-06 MED ORDER — OMEPRAZOLE 40 MG PO CPDR: 40.0000 mg | DELAYED_RELEASE_CAPSULE | Freq: Every day | ORAL | 1 refills | Status: DC

## 2018-07-06 MED ORDER — ATORVASTATIN CALCIUM 10 MG PO TABS: 10.0000 mg | ORAL_TABLET | Freq: Every day | ORAL | 1 refills | Status: DC

## 2018-07-06 MED ORDER — AMLODIPINE BESYLATE 5 MG PO TABS: 5.0000 mg | ORAL_TABLET | Freq: Every day | ORAL | 1 refills | Status: DC

## 2018-08-10 ENCOUNTER — Ambulatory Visit (INDEPENDENT_AMBULATORY_CARE_PROVIDER_SITE_OTHER): Payer: Medicare Other

## 2018-08-10 DIAGNOSIS — Z111 Encounter for screening for respiratory tuberculosis: Secondary | ICD-10-CM | POA: Diagnosis not present

## 2018-08-12 ENCOUNTER — Other Ambulatory Visit: Payer: Self-pay

## 2018-08-12 ENCOUNTER — Ambulatory Visit (INDEPENDENT_AMBULATORY_CARE_PROVIDER_SITE_OTHER): Payer: Medicare Other

## 2018-08-12 VITALS — HR 60 | Temp 97.6°F

## 2018-08-12 DIAGNOSIS — Z111 Encounter for screening for respiratory tuberculosis: Secondary | ICD-10-CM | POA: Diagnosis not present

## 2018-08-12 LAB — TB SKIN TEST
Induration: 0 mm
TB SKIN TEST: NEGATIVE

## 2018-09-20 ENCOUNTER — Ambulatory Visit: Payer: Medicare Other | Admitting: Internal Medicine

## 2018-09-23 ENCOUNTER — Ambulatory Visit (INDEPENDENT_AMBULATORY_CARE_PROVIDER_SITE_OTHER): Payer: Medicare Other | Admitting: Nurse Practitioner

## 2018-09-23 ENCOUNTER — Encounter: Payer: Self-pay | Admitting: Nurse Practitioner

## 2018-09-23 ENCOUNTER — Other Ambulatory Visit: Payer: Self-pay

## 2018-09-23 DIAGNOSIS — R739 Hyperglycemia, unspecified: Secondary | ICD-10-CM | POA: Diagnosis not present

## 2018-09-23 DIAGNOSIS — Z8719 Personal history of other diseases of the digestive system: Secondary | ICD-10-CM

## 2018-09-23 DIAGNOSIS — I10 Essential (primary) hypertension: Secondary | ICD-10-CM

## 2018-09-23 DIAGNOSIS — K219 Gastro-esophageal reflux disease without esophagitis: Secondary | ICD-10-CM

## 2018-09-23 DIAGNOSIS — E785 Hyperlipidemia, unspecified: Secondary | ICD-10-CM

## 2018-09-23 NOTE — Progress Notes (Signed)
This service is provided via telemedicine  No vital signs collected/recorded due to the encounter was a telemedicine visit.   Location of patient (ex: home, work):  HOME  Patient consents to a telephone visit: YES   Location of the provider (ex: office, home): Office  Name of any referring provider:Demetris Capell Janett Billow NP  Names of all persons participating in the telemedicine service and their role in the encounter:Tiffany [employer] NP,Patient [place]   Time spent on call:  CMA spent 4 mins   Patient has a tele visit for a 5 month follow up Virtual Visit via Telephone Note  I connected with [place] on 09/23/18 at  9:30 AM EDT by telephone and verified that I am speaking with the correct person using two identifiers.   I discussed the limitations, risks, security and privacy concerns of performing an evaluation and management service by telephone and the availability of in person appointments. I also discussed with the patient that there may be a patient responsible charge related to this service. The patient expressed understanding and agreed to proceed.     Careteam: Patient Care Team: [name], NP as PCP - General (Geriatric Medicine) Warden Fillers, MD as Consulting Physician (Ophthalmology)  Advanced Directive information Does Patient Have a Medical Advance Directive?: Yes, Type of Advance Directive: [place], Does patient want to make changes to medical advance directive?: No - Patient declined  No Known Allergies  Chief Complaint  Patient presents with  . Medical Management of Chronic Issues    5 month follow up tele-visit      HPI: Patient is a 73 y.o. female via tele-visiti for routine follow up.  Reports she is doing well.   htn- taking norvasc 5 mg, losartan 50 mg by mouth daily with ASA 81 mg daily. Does not take blood pressure at home but has been well controlled.   GERD- using  omeprazole 40 mg daily which controls symptoms; no GERD.   Constipation- using miralax 17 g, using 1/2 dose BID to prevent bowel obstruction. Also using probiotic daily, no issues with this.   Hyperlipidemia- lipitor 10 mg daily, heart healthy diet   Using supplements for bone health calcium and vit d BID - bone density in 2019 normal   Continues to exercise - walking at a fast pace 1 hour (4 miles/hr) 4-5 times a week  Review of Systems:  Review of Systems  Constitutional: Negative for chills, fever and weight loss.  HENT: Negative for tinnitus.   Eyes:       Dry eyes, wears glasses  Respiratory: Negative for cough, sputum production and shortness of breath.   Cardiovascular: Negative for chest pain, palpitations and leg swelling.  Gastrointestinal: Negative for abdominal pain, constipation, diarrhea and heartburn.  Genitourinary: Negative for dysuria, frequency and urgency.  Musculoskeletal: Negative for back pain, falls, joint pain and myalgias.  Skin: Negative.   Neurological: Negative for dizziness and headaches.  Psychiatric/Behavioral: Negative for depression and memory loss. The patient does not have insomnia.     Past Medical History:  Diagnosis Date  . Acid reflux   . Bowel obstruction ([place])   . H/O hiatal hernia   . Hyperlipidemia   . Hypertension    Past Surgical History:  Procedure Laterality Date  . ABDOMINAL HYSTERECTOMY    . APPENDECTOMY    . COLONOSCOPY  03/18/2012   Procedure: COLONOSCOPY;  Surgeon: Rogene Houston, MD;  Location: [facility];  Service: Endoscopy;  Laterality: N/A;  930  . LAPAROTOMY N/A 02/14/2013   Procedure: EXPLORATORY LAPAROTOMY;  Surgeon: Jamesetta So, MD;  Location: [facility];  Service: General;  Laterality: N/A;   Social History:   reports that she has never smoked. She has never used smokeless tobacco. She reports that she does not drink alcohol or use drugs.  Family History  Problem Relation Age of Onset  . Cancer -  Other Mother        Kidney  . Fibromyalgia Mother   . Arthritis Mother   . Cancer - Lung Father 25  . Heart disease Brother 75       had heart transplant then died 3 years later after restarting smoking    Medications: Patient's Medications  New Prescriptions   No medications on file  Previous Medications   AMLODIPINE (NORVASC) 5 MG TABLET    Take 1 tablet (5 mg total) by mouth daily.   ASPIRIN EC 81 MG TABLET    Take 81 mg by mouth daily.   ATORVASTATIN (LIPITOR) 10 MG TABLET    Take 1 tablet (10 mg total) by mouth daily.   BETA CAROTENE W/MINERALS (OCUVITE) TABLET    Take 1 tablet by mouth daily.   CALCIUM CARBONATE-VITAMIN D (CALCIUM 600 + D PO)    Take 1 tablet by mouth 2 (two) times daily.   CHOLECALCIFEROL (VITAMIN D) 1000 UNITS TABLET    Take 5,000 Units by mouth daily.    DOCUSATE SODIUM (COLACE) 100 MG CAPSULE    Take 100 mg by mouth 2 (two) times daily.   FISH OIL-CHOLECALCIFEROL (FISH OIL + D3) 1000-1000 MG-UNIT CAPS    Take 1 capsule by mouth 2 (two) times daily.   LACTOBACILLUS-INULIN (CULTURELLE DIGESTIVE HEALTH PO)    Take 1 capsule by mouth daily.    LOSARTAN (COZAAR) 50 MG TABLET    Take 50 mg by mouth daily.   MISC NATURAL PRODUCTS (OSTEO BI-FLEX ADV DOUBLE ST PO)    Take 1 tablet by mouth 2 (two) times daily.   MULTIPLE VITAMIN (MULTIVITAMIN WITH MINERALS) TABS    Take 1 tablet by mouth daily.   OMEPRAZOLE (PRILOSEC) 40 MG CAPSULE    Take 1 capsule (40 mg total) by mouth daily.   POLYETHYLENE GLYCOL POWDER (GLYCOLAX/MIRALAX) POWDER    Take 17 g by mouth daily.  Modified Medications   No medications on file  Discontinued Medications   No medications on file     Physical Exam: unable due to tele-visit    Labs reviewed: Basic Metabolic Panel: Recent Labs    04/19/18 0854  NA 142  K 4.3  CL 106  CO2 31  GLUCOSE 89  BUN 11  CREATININE 0.64  CALCIUM 9.3   Liver Function Tests: Recent Labs    04/19/18 0854  AST 19  ALT 18  BILITOT 0.6  PROT 6.6    No results for input(s): LIPASE, AMYLASE in the last 8760 hours. No results for input(s): AMMONIA in the last 8760 hours. CBC: Recent Labs    04/19/18 0854  WBC 7.2  NEUTROABS 3,586  HGB 14.2  HCT 42.3  MCV 93.6  PLT 274   Lipid Panel: Recent Labs    04/19/18 0854  CHOL 119  HDL 39*  LDLCALC 56  TRIG 159*  CHOLHDL 3.1   TSH: No results for input(s): TSH in the last 8760 hours. A1C: Lab Results  Component Value Date   HGBA1C 5.7 (H) 04/19/2018     Assessment/Plan  1. Gastroesophageal reflux disease without esophagitis Controlled with diet modifications and omeprazole.  2. Essential hypertension No blood pressure reading today however well controlled in the past on norvasc and losartan   3. Hyperlipidemia, unspecified hyperlipidemia type LDL 56 on lipitor 10 mg by mouth daily with diet modifications.  4. Hyperglycemia Diet modifications encouraged, will follow up a1c prior to next office visit.   5. Hx of small bowel obstruction Moving bowels regularly with mirlax and stool softener. Continues routine exercise and diet modifications.   Next appt: 02/14/2019 [facility] American. Harle Battiest  [facility] [phone]  Follow Up Instructions:    I discussed the assessment and treatment plan with the patient. The patient was provided an opportunity to ask questions and all were answered. The patient agreed with the plan and demonstrated an understanding of the instructions.   The patient was advised to call back or seek an in-person evaluation if the symptoms worsen or if the condition fails to improve as anticipated.  I provided 10 minutes of non-face-to-face time during this encounter.

## 2018-12-10 ENCOUNTER — Other Ambulatory Visit: Payer: Self-pay | Admitting: *Deleted

## 2018-12-10 MED ORDER — OMEPRAZOLE 40 MG PO CPDR: 40.0000 mg | DELAYED_RELEASE_CAPSULE | Freq: Every day | ORAL | 1 refills | Status: DC

## 2018-12-10 MED ORDER — ATORVASTATIN CALCIUM 10 MG PO TABS: 10.0000 mg | ORAL_TABLET | Freq: Every day | ORAL | 1 refills | Status: DC

## 2018-12-10 MED ORDER — AMLODIPINE BESYLATE 5 MG PO TABS: 5.0000 mg | ORAL_TABLET | Freq: Every day | ORAL | 1 refills | Status: DC

## 2018-12-10 NOTE — Telephone Encounter (Signed)
[facility]

## 2019-01-17 ENCOUNTER — Other Ambulatory Visit: Payer: Self-pay | Admitting: Nurse Practitioner

## 2019-01-17 DIAGNOSIS — Z1231 Encounter for screening mammogram for malignant neoplasm of breast: Secondary | ICD-10-CM

## 2019-01-18 ENCOUNTER — Other Ambulatory Visit: Payer: Self-pay

## 2019-01-18 ENCOUNTER — Ambulatory Visit
Admission: RE | Admit: 2019-01-18 | Discharge: 2019-01-18 | Disposition: A | Discharge status: Home / Self Care | Payer: Medicare Other | Source: Ambulatory Visit | Attending: Nurse Practitioner | Admitting: Nurse Practitioner

## 2019-01-18 DIAGNOSIS — Z1231 Encounter for screening mammogram for malignant neoplasm of breast: Secondary | ICD-10-CM | POA: Diagnosis not present

## 2019-01-24 DIAGNOSIS — D2262 Melanocytic nevi of left upper limb, including shoulder: Secondary | ICD-10-CM | POA: Diagnosis not present

## 2019-01-24 DIAGNOSIS — C4359 Malignant melanoma of other part of trunk: Secondary | ICD-10-CM | POA: Diagnosis not present

## 2019-02-01 HISTORY — PX: SKIN SURGERY: SHX2413

## 2019-02-08 DIAGNOSIS — C4359 Malignant melanoma of other part of trunk: Secondary | ICD-10-CM | POA: Diagnosis not present

## 2019-02-08 DIAGNOSIS — D225 Melanocytic nevi of trunk: Secondary | ICD-10-CM | POA: Diagnosis not present

## 2019-02-14 ENCOUNTER — Encounter: Payer: Self-pay | Admitting: Nurse Practitioner

## 2019-02-14 ENCOUNTER — Encounter: Payer: Medicare Other | Admitting: Nurse Practitioner

## 2019-02-14 ENCOUNTER — Ambulatory Visit (INDEPENDENT_AMBULATORY_CARE_PROVIDER_SITE_OTHER): Payer: Medicare Other | Admitting: Nurse Practitioner

## 2019-02-14 ENCOUNTER — Other Ambulatory Visit: Payer: Self-pay

## 2019-02-14 VITALS — BP 134/82 | HR 99 | Temp 98.0°F | Ht 64.0 in | Wt 182.0 lb

## 2019-02-14 DIAGNOSIS — E785 Hyperlipidemia, unspecified: Secondary | ICD-10-CM

## 2019-02-14 DIAGNOSIS — Z23 Encounter for immunization: Secondary | ICD-10-CM | POA: Diagnosis not present

## 2019-02-14 DIAGNOSIS — Z0000 Encounter for general adult medical examination without abnormal findings: Secondary | ICD-10-CM

## 2019-02-14 DIAGNOSIS — R739 Hyperglycemia, unspecified: Secondary | ICD-10-CM | POA: Diagnosis not present

## 2019-02-14 DIAGNOSIS — K219 Gastro-esophageal reflux disease without esophagitis: Secondary | ICD-10-CM

## 2019-02-14 DIAGNOSIS — Z8719 Personal history of other diseases of the digestive system: Secondary | ICD-10-CM

## 2019-02-14 DIAGNOSIS — I10 Essential (primary) hypertension: Secondary | ICD-10-CM | POA: Diagnosis not present

## 2019-02-14 NOTE — Patient Instructions (Signed)
Keep lab appt in October but make 6 month follow up

## 2019-02-14 NOTE — Patient Instructions (Signed)
[name] [name] , Thank you for taking time to come for your Medicare Wellness Visit. I appreciate your ongoing commitment to your health goals. Please review the following plan we discussed and let me know if I can assist you in the future.   Screening recommendations/referrals: Colonoscopy up to date Mammogram up to date Bone Density up to date Recommended yearly ophthalmology/optometry visit for glaucoma screening and checkup Recommended yearly dental visit for hygiene and checkup  Vaccinations: Influenza vaccine: done today Pneumococcal vaccine up to date Tdap vaccine up to date Shingles vaccine up to date    Advanced directives: on file.   Conditions/risks identified: obesity noted with BMI.  Next appointment: 1 year   Preventive Care 72 Years and Older, Female Preventive care refers to lifestyle choices and visits with your health care provider that can promote health and wellness. What does preventive care include?  A yearly physical exam. This is also called an annual well check.  Dental exams once or twice a year.  Routine eye exams. Ask your health care provider how often you should have your eyes checked.  Personal lifestyle choices, including:  Daily care of your teeth and gums.  Regular physical activity.  Eating a healthy diet.  Avoiding tobacco and drug use.  Limiting alcohol use.  Practicing safe sex.  Taking low-dose aspirin every day.  Taking vitamin and mineral supplements as recommended by your health care provider. What happens during an annual well check? The services and screenings done by your health care provider during your annual well check will depend on your age, overall health, lifestyle risk factors, and family history of disease. Counseling  Your health care provider may ask you questions about your:  Alcohol use.  Tobacco use.  Drug use.  Emotional well-being.  Home and relationship well-being.  Sexual activity.   Eating habits.  History of falls.  Memory and ability to understand (cognition).  Work and work Statistician.  Reproductive health. Screening  You may have the following tests or measurements:  Height, weight, and BMI.  Blood pressure.  Lipid and cholesterol levels. These may be checked every 5 years, or more frequently if you are over 50 years old.  Skin check.  Lung cancer screening. You may have this screening every year starting at age 72 if you have a 30-pack-year history of smoking and currently smoke or have quit within the past 15 years.  Fecal occult blood test (FOBT) of the stool. You may have this test every year starting at age 72.  Flexible sigmoidoscopy or colonoscopy. You may have a sigmoidoscopy every 5 years or a colonoscopy every 10 years starting at age 72.  Hepatitis C blood test.  Hepatitis B blood test.  Sexually transmitted disease (STD) testing.  Diabetes screening. This is done by checking your blood sugar (glucose) after you have not eaten for a while (fasting). You may have this done every 1-3 years.  Bone density scan. This is done to screen for osteoporosis. You may have this done starting at age 72.  Mammogram. This may be done every 1-2 years. Talk to your health care provider about how often you should have regular mammograms. Talk with your health care provider about your test results, treatment options, and if necessary, the need for more tests. Vaccines  Your health care provider may recommend certain vaccines, such as:  Influenza vaccine. This is recommended every year.  Tetanus, diphtheria, and acellular pertussis (Tdap, Td) vaccine. You may need a Td booster every  10 years.  Zoster vaccine. You may need this after age 69.  Pneumococcal 13-valent conjugate (PCV13) vaccine. One dose is recommended after age 59.  Pneumococcal polysaccharide (PPSV23) vaccine. One dose is recommended after age 8. Talk to your health care provider  about which screenings and vaccines you need and how often you need them. This information is not intended to replace advice given to you by your health care provider. Make sure you discuss any questions you have with your health care provider. Document Released: 06/15/2015 Document Revised: 02/06/2016 Document Reviewed: 03/20/2015 Elsevier Interactive Patient Education  2017 [place] Prevention in the Home Falls can cause injuries. They can happen to people of all ages. There are many things you can do to make your home safe and to help prevent falls. What can I do on the outside of my home?  Regularly fix the edges of walkways and driveways and fix any cracks.  Remove anything that might make you trip as you walk through a door, such as a raised step or threshold.  Trim any bushes or trees on the path to your home.  Use bright outdoor lighting.  Clear any walking paths of anything that might make someone trip, such as rocks or tools.  Regularly check to see if handrails are loose or broken. Make sure that both sides of any steps have handrails.  Any raised decks and porches should have guardrails on the edges.  Have any leaves, snow, or ice cleared regularly.  Use sand or salt on walking paths during winter.  Clean up any spills in your garage right away. This includes oil or grease spills. What can I do in the bathroom?  Use night lights.  Install grab bars by the toilet and in the tub and shower. Do not use towel bars as grab bars.  Use non-skid mats or decals in the tub or shower.  If you need to sit down in the shower, use a plastic, non-slip stool.  Keep the floor dry. Clean up any water that spills on the floor as soon as it happens.  Remove soap buildup in the tub or shower regularly.  Attach bath mats securely with double-sided non-slip rug tape.  Do not have throw rugs and other things on the floor that can make you trip. What can I do in the  bedroom?  Use night lights.  Make sure that you have a light by your bed that is easy to reach.  Do not use any sheets or blankets that are too big for your bed. They should not hang down onto the floor.  Have a firm chair that has side arms. You can use this for support while you get dressed.  Do not have throw rugs and other things on the floor that can make you trip. What can I do in the kitchen?  Clean up any spills right away.  Avoid walking on wet floors.  Keep items that you use a lot in easy-to-reach places.  If you need to reach something above you, use a strong step stool that has a grab bar.  Keep electrical cords out of the way.  Do not use floor polish or wax that makes floors slippery. If you must use wax, use non-skid floor wax.  Do not have throw rugs and other things on the floor that can make you trip. What can I do with my stairs?  Do not leave any items on the stairs.  Make sure  that there are handrails on both sides of the stairs and use them. Fix handrails that are broken or loose. Make sure that handrails are as long as the stairways.  Check any carpeting to make sure that it is firmly attached to the stairs. Fix any carpet that is loose or worn.  Avoid having throw rugs at the top or bottom of the stairs. If you do have throw rugs, attach them to the floor with carpet tape.  Make sure that you have a light switch at the top of the stairs and the bottom of the stairs. If you do not have them, ask someone to add them for you. What else can I do to help prevent falls?  Wear shoes that:  Do not have high heels.  Have rubber bottoms.  Are comfortable and fit you well.  Are closed at the toe. Do not wear sandals.  If you use a stepladder:  Make sure that it is fully opened. Do not climb a closed stepladder.  Make sure that both sides of the stepladder are locked into place.  Ask someone to hold it for you, if possible.  Clearly mark and make  sure that you can see:  Any grab bars or handrails.  First and last steps.  Where the edge of each step is.  Use tools that help you move around (mobility aids) if they are needed. These include:  Canes.  Walkers.  Scooters.  Crutches.  Turn on the lights when you go into a dark area. Replace any light bulbs as soon as they burn out.  Set up your furniture so you have a clear path. Avoid moving your furniture around.  If any of your floors are uneven, fix them.  If there are any pets around you, be aware of where they are.  Review your medicines with your doctor. Some medicines can make you feel dizzy. This can increase your chance of falling. Ask your doctor what other things that you can do to help prevent falls. This information is not intended to replace advice given to you by your health care provider. Make sure you discuss any questions you have with your health care provider. Document Released: 03/15/2009 Document Revised: 10/25/2015 Document Reviewed: 06/23/2014 Elsevier Interactive Patient Education  2017 [employer].

## 2019-02-14 NOTE — Progress Notes (Signed)
Subjective:   [name] [name] is a 73 y.o. female who presents for Medicare Annual (Subsequent) preventive examination.  Review of Systems:   Cardiac Risk Factors include: advanced age (>67men, >1 women);family history of premature cardiovascular disease;obesity (BMI >30kg/m2);dyslipidemia;hypertension     Objective:     Vitals: BP 134/82   Pulse 99   Temp 98 F (36.7 C) (Temporal)   Ht 5\' 4"  (1.626 m)   Wt 182 lb (82.6 kg)   LMP 09/10/2014 (Exact Date)   SpO2 97%   BMI 31.24 kg/m   Body mass index is 31.24 kg/m.  Advanced Directives 02/14/2019 09/23/2018 04/23/2018 05/17/2017 05/17/2017 11/01/2015 11/01/2015  Does Patient Have a Medical Advance Directive? Yes Yes No Yes Yes Yes No  Type of Paramedic of [place];Living will Healthcare Power of [place];Living will [place];Living will Living will -  Does patient want to make changes to medical advance directive? No - Patient declined No - Patient declined - No - Patient declined - No - Patient declined -  Copy of [place] in Chart? Yes - validated most recent copy scanned in chart (See row information) Yes - validated most recent copy scanned in chart (See row information) - Yes No - copy requested No - copy requested -  Would patient like information on creating a medical advance directive? - - - - - No - patient declined information No - patient declined information  Pre-existing out of facility DNR order (yellow form or pink MOST form) - - - - - - -    Tobacco Social History   Tobacco Use  Smoking Status Never Smoker  Smokeless Tobacco Never Used     Counseling given: Not Answered   Clinical Intake:  Pre-visit preparation completed: Yes  Pain : No/denies pain     BMI - recorded: 31.24 Nutritional Status: BMI > 30  Obese Nutritional Risks: None Diabetes: No  How often do you need to have someone help you when  you read instructions, pamphlets, or other written materials from your doctor or pharmacy?: 1 - Never What is the last grade level you completed in school?: 12th grade  Interpreter Needed?: No     Past Medical History:  Diagnosis Date  . Acid reflux   . Bowel obstruction ([place])   . H/O hiatal hernia   . Hyperlipidemia   . Hypertension    Past Surgical History:  Procedure Laterality Date  . ABDOMINAL HYSTERECTOMY    . APPENDECTOMY    . COLONOSCOPY  03/18/2012   Procedure: COLONOSCOPY;  Surgeon: Rogene Houston, MD;  [facility];  Service: Endoscopy;  Laterality: N/A;  930  . LAPAROTOMY N/A 02/14/2013   Procedure: EXPLORATORY LAPAROTOMY;  Surgeon: Jamesetta So, MD;  Location: [facility];  Service: General;  Laterality: N/A;  . 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 Dr. Nevada Crane, [facility]    Family History  Problem Relation Age of Onset  . Cancer - Other Mother        Kidney  . Fibromyalgia Mother   . Arthritis Mother   . Cancer - Lung Father 12  . Heart disease Brother 21       had heart transplant then died 3 years later after restarting smoking   Social History   Socioeconomic History  . Marital status: Single    Spouse name: Not on file  . Number of children: Not  on file  . Years of education: Not on file  . Highest education level: Not on file  Occupational History  . Not on file  Social Needs  . Financial resource strain: Not on file  . Food insecurity    Worry: Not on file    Inability: Not on file  . Transportation needs    Medical: Not on file    Non-medical: Not on file  Tobacco Use  . Smoking status: Never Smoker  . Smokeless tobacco: Never Used  Substance and Sexual Activity  . Alcohol use: No  . Drug use: No  . Sexual activity: Not Currently  Lifestyle  . Physical activity    Days per week: Not on file    Minutes per session: Not on file  . Stress: Not on file  Relationships  . Social Herbalist on phone: Not  on file    Gets together: Not on file    Attends religious service: Not on file    Active member of club or organization: Not on file    Attends meetings of clubs or organizations: Not on file    Relationship status: Not on file  Other Topics Concern  . Not on file  Social History Narrative   Social History      Diet?       Do you drink/eat things with caffeine? yes      Marital status?            single                        What year were you married?      Do you live in a house, apartment, assisted living, condo, trailer, etc.? house      Is it one or more stories? Yes but seldom go upstairs      How many persons live in your home? 1       Do you have any pets in your home? (please list) no       Highest level of education completed? 12- business college      Current or past profession: retired      Do you exercise?                  yes                    Type & how often? 5 days a week- 1 hour per day      Advanced Directives      Do you have a living will? yes      Do you have a DNR form?                                  If not, do you want to discuss one? yes      Do you have signed POA/HPOA for forms?  yes      Functional Status      Do you have difficulty bathing or dressing yourself? no      Do you have difficulty preparing food or eating? no      Do you have difficulty managing your medications? no      Do you have difficulty managing your finances?  no      Do you have difficulty affording your medications?  no  Outpatient Encounter Medications as of 02/14/2019  Medication Sig  . amLODipine (NORVASC) 5 MG tablet Take 1 tablet (5 mg total) by mouth daily.  Marland Kitchen aspirin EC 81 MG tablet Take 81 mg by mouth daily.  Marland Kitchen atorvastatin (LIPITOR) 10 MG tablet Take 1 tablet (10 mg total) by mouth daily.  . beta carotene w/minerals (OCUVITE) tablet Take 1 tablet by mouth daily.  . Calcium Carbonate-Vitamin D (CALCIUM 600 + D PO) Take 1 tablet by mouth 2  (two) times daily.  . cholecalciferol (VITAMIN D) 1000 UNITS tablet Take 5,000 Units by mouth daily.   Marland Kitchen docusate sodium (COLACE) 100 MG capsule Take 100 mg by mouth 2 (two) times daily.  . Fish Oil-Cholecalciferol (FISH OIL + D3) 1000-1000 MG-UNIT CAPS Take 1 capsule by mouth 2 (two) times daily.  . Lactobacillus-Inulin (CULTURELLE DIGESTIVE HEALTH PO) Take 1 capsule by mouth daily.   Marland Kitchen losartan (COZAAR) 50 MG tablet Take 50 mg by mouth daily.  . Misc Natural Products (OSTEO BI-FLEX ADV DOUBLE ST PO) Take 1 tablet by mouth 2 (two) times daily.  . Multiple Vitamin (MULTIVITAMIN WITH MINERALS) TABS Take 1 tablet by mouth daily.  Marland Kitchen omeprazole (PRILOSEC) 40 MG capsule Take 1 capsule (40 mg total) by mouth daily.  . polyethylene glycol powder (GLYCOLAX/MIRALAX) powder Take 17 g by mouth daily.   No facility-administered encounter medications on file as of 02/14/2019.     Activities of Daily Living In your present state of health, do you have any difficulty performing the following activities: 02/14/2019  Hearing? N  Vision? Y  Difficulty concentrating or making decisions? N  Walking or climbing stairs? N  Dressing or bathing? N  Doing errands, shopping? N  Preparing Food and eating ? N  Using the Toilet? N  In the past six months, have you accidently leaked urine? N  Do you have problems with loss of bowel control? N  Managing your Medications? N  Managing your Finances? N  Housekeeping or managing your Housekeeping? N  Some recent data might be hidden    Patient Care Team: [name], NP as PCP - General (Geriatric Medicine) Warden Fillers, MD as Consulting Physician (Ophthalmology)    Assessment:   This is a routine wellness examination for [name] [name].  Exercise Activities and Dietary recommendations Current Exercise Habits: Home exercise routine, Type of exercise: walking, Time (Minutes): 60, Frequency (Times/Week): 4, Weekly Exercise (Minutes/Week): 240, Intensity:  Moderate  Goals   None     Fall Risk Fall Risk  02/14/2019 09/23/2018 04/23/2018 03/23/2018  Falls in the past year? 0 0 0 No  Number falls in past yr: 0 0 - -  Injury with Fall? 0 0 - -   Is the patient's home free of loose throw rugs in walkways, pet beds, electrical cords, etc?   yes      Grab bars in the bathroom? no      Handrails on the stairs?   yes      Adequate lighting?   yes  Timed Get Up and Go performed: na  Depression Screen PHQ 2/9 Scores 02/14/2019 04/23/2018 03/23/2018  PHQ - 2 Score 0 0 0     Cognitive Function MMSE - Mini Mental State Exam 02/14/2019  Orientation to time 4  Orientation to Place 5  Registration 3  Attention/ Calculation 5  Recall 3  Language- name 2 objects 2  Language- repeat 1  Language- follow 3 step command 3  Language- read & follow direction 1  Write a sentence 1  Copy design 1  Total score 29        Immunization History  Administered Date(s) Administered  . Influenza, High Dose Seasonal PF 03/26/2017, 03/23/2018  . Influenza,inj,Quad PF,6+ Mos 02/13/2013, 03/26/2015  . Influenza-Unspecified 06/02/2016  . PPD Test 08/10/2018  . Pneumococcal Conjugate-13 03/24/2014  . Pneumococcal Polysaccharide-23 04/23/2018  . Tdap 03/24/2018  . Tetanus 06/02/2001  . Zoster 06/02/2012  . Zoster Recombinat (Shingrix) 03/24/2018, 06/01/2018    Qualifies for Shingles Vaccine? Done.  Screening Tests Health Maintenance  Topic Date Due  . INFLUENZA VACCINE  01/01/2019  . Hepatitis C Screening  03/24/2019 (Originally 1945/10/21)  . MAMMOGRAM  01/17/2021  . COLONOSCOPY  03/19/2022  . TETANUS/TDAP  03/24/2028  . DEXA SCAN  Completed  . PNA vac Low Risk Adult  Completed    Cancer Screenings: Lung: Low Dose CT Chest recommended if Age 47-80 years, 30 pack-year currently smoking OR have quit w/in 15years. Patient does not qualify. Breast:  Up to date on Mammogram? Yes   Up to date of Bone Density/Dexa? Yes Colorectal: up to date   Additional Screenings: Hepatitis C Screening:na      Plan:      I have personally reviewed and noted the following in the patient's chart:   . Medical and social history . Use of alcohol, tobacco or illicit drugs  . Current medications and supplements . Functional ability and status . Nutritional status . Physical activity . Advanced directives . List of other physicians . Hospitalizations, surgeries, and ER visits in previous 12 months . Vitals . Screenings to include cognitive, depression, and falls . Referrals and appointments  In addition, I have reviewed and discussed with patient certain preventive protocols, quality metrics, and best practice recommendations. A written personalized care plan for preventive services as well as general preventive health recommendations were provided to patient.     [name], NP  02/14/2019

## 2019-02-14 NOTE — Progress Notes (Signed)
Careteam: Patient Care Team: [name], NP as PCP - General (Geriatric Medicine) Warden Fillers, MD as Consulting Physician (Ophthalmology)  Advanced Directive information    No Known Allergies  Chief Complaint  Patient presents with  . Medical Management of Chronic Issues    5 month follow-up  . Arm Problem    Examine raised area on left forearm      HPI: Patient is a 73 y.o. female seen in the office today for routine follow up.  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 Has staples in there, going back next week for removal.   htn- taking norvasc 5 mg daily, cozaar 50 mg with low sodium diet.   constipation controlled with miralax daily, colace twice daily and probiotic  GERD- controlled on omeprazole and dietary modifications.   hyperlipidemia continues on lipitor- not fasting today (has another upcoming lab appt)  Review of Systems:  Review of Systems  Constitutional: Negative for chills, fever and weight loss.  HENT: Negative for hearing loss.   Eyes:       Dry eyes, wears glasses  Respiratory: Negative for cough, sputum production and shortness of breath.   Cardiovascular: Negative for chest pain, palpitations and leg swelling.  Gastrointestinal: Negative for abdominal pain, constipation, diarrhea and heartburn.  Genitourinary: Negative for dysuria, frequency and urgency.  Musculoskeletal: Negative for back pain, falls, joint pain and myalgias.  Skin: Negative.   Neurological: Negative for dizziness and headaches.  Psychiatric/Behavioral: Negative for depression and memory loss. The patient does not have insomnia.     Past Medical History:  Diagnosis Date  . Acid reflux   . Bowel obstruction ([place])   . H/O hiatal hernia   . Hyperlipidemia   . Hypertension    Past Surgical History:  Procedure Laterality Date  . ABDOMINAL HYSTERECTOMY    . APPENDECTOMY    . COLONOSCOPY  03/18/2012   Procedure:  COLONOSCOPY;  Surgeon: Rogene Houston, MD;  [facility];  Service: Endoscopy;  Laterality: N/A;  930  . LAPAROTOMY N/A 02/14/2013   Procedure: EXPLORATORY LAPAROTOMY;  Surgeon: Jamesetta So, MD;  Location: [facility];  Service: General;  Laterality: N/A;  . 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 Dr. [place] Crane, [facility]    Social History:   reports that she has never smoked. She has never used smokeless tobacco. She reports that she does not drink alcohol or use drugs.  Family History  Problem Relation Age of Onset  . Cancer - Other Mother        Kidney  . Fibromyalgia Mother   . Arthritis Mother   . Cancer - Lung Father 28  . Heart disease Brother 58       had heart transplant then died 3 years later after restarting smoking    Medications: Patient's Medications  New Prescriptions   No medications on file  Previous Medications   AMLODIPINE (NORVASC) 5 MG TABLET    Take 1 tablet (5 mg total) by mouth daily.   ASPIRIN EC 81 MG TABLET    Take 81 mg by mouth daily.   ATORVASTATIN (LIPITOR) 10 MG TABLET    Take 1 tablet (10 mg total) by mouth daily.   BETA CAROTENE W/MINERALS (OCUVITE) TABLET    Take 1 tablet by mouth daily.   CALCIUM CARBONATE-VITAMIN D (CALCIUM 600 + D PO)    Take 1 tablet by mouth 2 (two) times daily.  CHOLECALCIFEROL (VITAMIN D) 1000 UNITS TABLET    Take 5,000 Units by mouth daily.    DOCUSATE SODIUM (COLACE) 100 MG CAPSULE    Take 100 mg by mouth 2 (two) times daily.   FISH OIL-CHOLECALCIFEROL (FISH OIL + D3) 1000-1000 MG-UNIT CAPS    Take 1 capsule by mouth 2 (two) times daily.   LACTOBACILLUS-INULIN (CULTURELLE DIGESTIVE HEALTH PO)    Take 1 capsule by mouth daily.    LOSARTAN (COZAAR) 50 MG TABLET    Take 50 mg by mouth daily.   MISC NATURAL PRODUCTS (OSTEO BI-FLEX ADV DOUBLE ST PO)    Take 1 tablet by mouth 2 (two) times daily.   MULTIPLE VITAMIN (MULTIVITAMIN WITH MINERALS) TABS    Take 1 tablet by mouth daily.   OMEPRAZOLE (PRILOSEC) 40  MG CAPSULE    Take 1 capsule (40 mg total) by mouth daily.   POLYETHYLENE GLYCOL POWDER (GLYCOLAX/MIRALAX) POWDER    Take 17 g by mouth daily.  Modified Medications   No medications on file  Discontinued Medications   No medications on file    Physical Exam:  Vitals:   02/14/19 1124  BP: 134/82  Pulse: 99  Temp: 98 F (36.7 C)  TempSrc: Temporal  SpO2: 97%  Weight: 182 lb (82.6 kg)  Height: 5\' 4"  (1.626 m)   Body mass index is 31.24 kg/m. Wt Readings from Last 3 Encounters:  02/14/19 182 lb (82.6 kg)  02/14/19 182 lb (82.6 kg)  04/23/18 179 lb (81.2 kg)    Physical Exam Constitutional:      General: She is not in acute distress.    Appearance: She is well-developed. She is not diaphoretic.  HENT:     Head: Normocephalic and atraumatic.  Eyes:     Conjunctiva/sclera: Conjunctivae normal.     Pupils: Pupils are equal, round, and reactive to light.  Neck:     Musculoskeletal: Normal range of motion and neck supple.  Cardiovascular:     Rate and Rhythm: Normal rate and regular rhythm.     Heart sounds: Normal heart sounds.  Pulmonary:     Effort: Pulmonary effort is normal.     Breath sounds: Normal breath sounds.  Abdominal:     General: Bowel sounds are normal.     Palpations: Abdomen is soft.  Musculoskeletal:        General: No tenderness.  Skin:    General: Skin is warm and dry.  Neurological:     Mental Status: She is alert and oriented to person, place, and time.     Labs reviewed: Basic Metabolic Panel: Recent Labs    04/19/18 0854  NA 142  K 4.3  CL 106  CO2 31  GLUCOSE 89  BUN 11  CREATININE 0.64  CALCIUM 9.3   Liver Function Tests: Recent Labs    04/19/18 0854  AST 19  ALT 18  BILITOT 0.6  PROT 6.6   No results for input(s): LIPASE, AMYLASE in the last 8760 hours. No results for input(s): AMMONIA in the last 8760 hours. CBC: Recent Labs    04/19/18 0854  WBC 7.2  NEUTROABS 3,586  HGB 14.2  HCT 42.3  MCV 93.6  PLT 274    Lipid Panel: Recent Labs    04/19/18 0854  CHOL 119  HDL 39*  LDLCALC 56  TRIG 159*  CHOLHDL 3.1   TSH: No results for input(s): TSH in the last 8760 hours. A1C: Lab Results  Component Value Date   HGBA1C  5.7 (H) 04/19/2018     Assessment/Plan 1. Need for influenza vaccination - Flu Vaccine QUAD High Dose(Fluad)  2. Gastroesophageal reflux disease without esophagitis Symptoms controlled on omeprazole with dietary modifications  3. Essential hypertension -controlled on norvasc and losartanwith dietary modifications.  4. Hyperglycemia Will follow up A1c with scheduled labs  5. Hx of small bowel obstruction Using bowel regimen to prevent constipation which has been going well.   6. Hyperlipidemia, unspecified hyperlipidemia type Continues on Lipitor. LDL at goal on last labs, continue dietary modifications.   Next appt: 6 months  [facility] [place], [facility] [phone]

## 2019-02-22 ENCOUNTER — Encounter: Payer: Self-pay | Admitting: Nurse Practitioner

## 2019-03-03 HISTORY — PX: MELANOMA EXCISION: SHX5266

## 2019-03-21 ENCOUNTER — Other Ambulatory Visit: Payer: Medicare Other

## 2019-03-21 ENCOUNTER — Other Ambulatory Visit: Payer: Self-pay

## 2019-03-21 DIAGNOSIS — I10 Essential (primary) hypertension: Secondary | ICD-10-CM | POA: Diagnosis not present

## 2019-03-21 DIAGNOSIS — E785 Hyperlipidemia, unspecified: Secondary | ICD-10-CM

## 2019-03-21 DIAGNOSIS — R739 Hyperglycemia, unspecified: Secondary | ICD-10-CM

## 2019-03-22 LAB — CBC WITH DIFFERENTIAL/PLATELET
Absolute Monocytes: 722 cells/uL (ref 200–950)
Basophils Absolute: 99 cells/uL (ref 0–200)
Basophils Relative: 1.3 %
Eosinophils Absolute: 220 cells/uL (ref 15–500)
Eosinophils Relative: 2.9 %
HCT: 42.2 % (ref 35.0–45.0)
Hemoglobin: 14.3 g/dL (ref 11.7–15.5)
Lymphs Abs: 2440 cells/uL (ref 850–3900)
MCH: 31.6 pg (ref 27.0–33.0)
MCHC: 33.9 g/dL (ref 32.0–36.0)
MCV: 93.4 fL (ref 80.0–100.0)
MPV: 12.5 fL (ref 7.5–12.5)
Monocytes Relative: 9.5 %
Neutro Abs: 4119 cells/uL (ref 1500–7800)
Neutrophils Relative %: 54.2 %
Platelets: 258 10*3/uL (ref 140–400)
RBC: 4.52 10*6/uL (ref 3.80–5.10)
RDW: 12.2 % (ref 11.0–15.0)
Total Lymphocyte: 32.1 %
WBC: 7.6 10*3/uL (ref 3.8–10.8)

## 2019-03-22 LAB — COMPLETE METABOLIC PANEL WITH GFR
AG Ratio: 1.6 (calc) (ref 1.0–2.5)
ALT: 30 U/L — ABNORMAL HIGH (ref 6–29)
AST: 26 U/L (ref 10–35)
Albumin: 3.9 g/dL (ref 3.6–5.1)
Alkaline phosphatase (APISO): 69 U/L (ref 37–153)
BUN: 13 mg/dL (ref 7–25)
CO2: 30 mmol/L (ref 20–32)
Calcium: 9.2 mg/dL (ref 8.6–10.4)
Chloride: 107 mmol/L (ref 98–110)
Creat: 0.6 mg/dL (ref 0.60–0.93)
GFR, Est African American: 106 mL/min/{1.73_m2} (ref >=60)
GFR, Est Non African American: 91 mL/min/{1.73_m2} (ref >=60)
Globulin: 2.4 g/dL (calc) (ref 1.9–3.7)
Glucose, Bld: 91 mg/dL (ref 65–99)
Potassium: 4.4 mmol/L (ref 3.5–5.3)
Sodium: 143 mmol/L (ref 135–146)
Total Bilirubin: 0.7 mg/dL (ref 0.2–1.2)
Total Protein: 6.3 g/dL (ref 6.1–8.1)

## 2019-03-22 LAB — HEMOGLOBIN A1C
Hgb A1c MFr Bld: 5.6 % of total Hgb (ref <5.7)
Mean Plasma Glucose: 114 (calc)
eAG (mmol/L): 6.3 (calc)

## 2019-03-22 LAB — LIPID PANEL
Cholesterol: 120 mg/dL (ref <200)
HDL: 40 mg/dL — ABNORMAL LOW (ref >=50)
LDL Cholesterol (Calc): 53 mg/dL (calc)
Non-HDL Cholesterol (Calc): 80 mg/dL (calc) (ref <130)
Total CHOL/HDL Ratio: 3 (calc) (ref <5.0)
Triglycerides: 199 mg/dL — ABNORMAL HIGH (ref <150)

## 2019-03-25 ENCOUNTER — Ambulatory Visit: Payer: Medicare Other | Admitting: Nurse Practitioner

## 2019-04-15 ENCOUNTER — Ambulatory Visit
Admission: EM | Admit: 2019-04-15 | Discharge: 2019-04-15 | Disposition: A | Discharge status: Home / Self Care | Payer: Medicare Other | Attending: Emergency Medicine | Admitting: Emergency Medicine

## 2019-04-15 ENCOUNTER — Other Ambulatory Visit: Payer: Self-pay

## 2019-04-15 ENCOUNTER — Ambulatory Visit (INDEPENDENT_AMBULATORY_CARE_PROVIDER_SITE_OTHER): Payer: Medicare Other

## 2019-04-15 DIAGNOSIS — S92155A Nondisplaced avulsion fracture (chip fracture) of left talus, initial encounter for closed fracture: Secondary | ICD-10-CM | POA: Diagnosis not present

## 2019-04-15 DIAGNOSIS — R2242 Localized swelling, mass and lump, left lower limb: Secondary | ICD-10-CM

## 2019-04-15 DIAGNOSIS — M25572 Pain in left ankle and joints of left foot: Secondary | ICD-10-CM | POA: Diagnosis not present

## 2019-04-15 MED ORDER — IBUPROFEN 600 MG PO TABS: 600.0000 mg | ORAL_TABLET | Freq: Three times a day (TID) | ORAL | 0 refills | Status: DC | PRN

## 2019-04-15 NOTE — ED Provider Notes (Signed)
[facility]    CSN: [id number] Arrival date & time: 04/15/19  1003      History   Chief Complaint Chief Complaint  Patient presents with  . left ankle injury    HPI [name] [name] is a 73 y.o. female.   The history is provided by the patient. No language interpreter was used.  Ankle Pain Location:  Ankle Time since incident:  14 hours Injury: yes   Mechanism of injury: fall   Fall:    Fall occurred:  Down stairs (Was coming down the stairs and missed 1 stair step and her left ankle twisted)   Height of fall:  1step   Impact surface:  Concrete   Point of impact: Outstretched ankle.   Entrapped after fall: no   Ankle location:  L ankle Pain details:    Quality:  Aching   Radiates to:  Does not radiate   Severity:  Moderate   Onset quality:  Sudden   Duration:  14 hours   Timing:  Constant   Progression:  Worsening Chronicity:  New Dislocation: no   Foreign body present:  No foreign bodies Tetanus status:  Unknown Prior injury to area:  No Relieved by:  Acetaminophen Worsened by:  Bearing weight Ineffective treatments:  None tried Associated symptoms: decreased ROM and swelling   Associated symptoms: no back pain, no fatigue, no fever, no neck pain and no numbness     Past Medical History:  Diagnosis Date  . Acid reflux   . Bowel obstruction ([place])   . H/O hiatal hernia   . Hyperlipidemia   . Hypertension     Patient Active Problem List   Diagnosis Date Noted  . Hx of small bowel obstruction 03/23/2018  . GERD (gastroesophageal reflux disease) 03/24/2015  . Hypokalemia 06/21/2014  . Hyperglycemia 06/20/2014  . Hypertension   . Hyperlipidemia   . Bowel obstruction ([place]) 10/01/2013  . Fatty liver 02/12/2013  . Transaminitis 02/12/2013    Past Surgical History:  Procedure Laterality Date  . ABDOMINAL HYSTERECTOMY    . APPENDECTOMY    . COLONOSCOPY  03/18/2012   Procedure: COLONOSCOPY;  Surgeon: Rogene Houston, MD;   [facility];  Service: Endoscopy;  Laterality: N/A;  930  . LAPAROTOMY N/A 02/14/2013   Procedure: EXPLORATORY LAPAROTOMY;  Surgeon: Jamesetta So, MD;  Location: [facility];  Service: General;  Laterality: N/A;  . 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 Dr. Nevada Crane, [facility]mann     OB History   No obstetric history on file.      Home Medications    Prior to Admission medications   Medication Sig Start Date End Date Taking? Authorizing Provider  amLODipine (NORVASC) 5 MG tablet Take 1 tablet (5 mg total) by mouth daily. 12/10/18   [name], NP  aspirin EC 81 MG tablet Take 81 mg by mouth daily.    [provider]  atorvastatin (LIPITOR) 10 MG tablet Take 1 tablet (10 mg total) by mouth daily. 12/10/18   [name], NP  beta carotene w/minerals (OCUVITE) tablet Take 1 tablet by mouth daily.    [provider]  Calcium Carbonate-Vitamin D (CALCIUM 600 + D PO) Take 1 tablet by mouth 2 (two) times daily.    [provider]  cholecalciferol (VITAMIN D) 1000 UNITS tablet Take 5,000 Units by mouth daily.     [provider]  docusate sodium (COLACE) 100 MG capsule Take  100 mg by mouth 2 (two) times daily.    [provider]  Fish Oil-Cholecalciferol (FISH OIL + D3) 1000-1000 MG-UNIT CAPS Take 1 capsule by mouth 2 (two) times daily.    [provider]  Lactobacillus-Inulin (CULTURELLE DIGESTIVE HEALTH PO) Take 1 capsule by mouth daily.     [provider]  losartan (COZAAR) 50 MG tablet Take 50 mg by mouth daily.    [provider]  Misc Natural Products (OSTEO BI-FLEX ADV DOUBLE ST PO) Take 1 tablet by mouth 2 (two) times daily.    [provider]  Multiple Vitamin (MULTIVITAMIN WITH MINERALS) TABS Take 1 tablet by mouth daily.    [provider]  omeprazole (PRILOSEC) 40 MG capsule Take 1 capsule (40 mg total) by mouth daily. 12/10/18   [name], NP  polyethylene  glycol powder (GLYCOLAX/MIRALAX) powder Take 17 g by mouth daily.    [provider]    Family History Family History  Problem Relation Age of Onset  . Cancer - Other Mother        Kidney  . Fibromyalgia Mother   . Arthritis Mother   . Cancer - Lung Father 5  . Heart disease Brother 35       had heart transplant then died 3 years later after restarting smoking    Social History Social History   Tobacco Use  . Smoking status: Never Smoker  . Smokeless tobacco: Never Used  Substance Use Topics  . Alcohol use: No  . Drug use: No     Allergies   Patient has no known allergies.   Review of Systems Review of Systems  Constitutional: Negative for activity change, chills, fatigue and fever.  Respiratory: Negative for apnea, cough, shortness of breath and wheezing.   Cardiovascular: Negative for chest pain and palpitations.  Musculoskeletal: Positive for joint swelling. Negative for back pain and neck pain.       Left ankle swelling  Skin: Negative for color change and rash.  Neurological: Negative for numbness.       Negative tingling  ROS: All other are negatives  Physical Exam Triage Vital Signs ED Triage Vitals  Enc Vitals Group     BP 04/15/19 1015 (!) 187/97     Pulse Rate 04/15/19 1015 68     Resp 04/15/19 1015 16     Temp 04/15/19 1015 98.1 F (36.7 C)     Temp Source 04/15/19 1015 Oral     SpO2 04/15/19 1015 96 %     Weight --      Height --      Head Circumference --      Peak Flow --      Pain Score 04/15/19 1017 8     Pain Loc --      Pain Edu? --      Excl. in [place]? --    No data found.  Updated Vital Signs BP (!) 187/97 (BP Location: Right Arm)   Pulse 68   Temp 98.1 F (36.7 C) (Oral)   Resp 16   LMP 09/10/2014 (Exact Date)   SpO2 96%   Visual Acuity Right Eye Distance:   Left Eye Distance:   Bilateral Distance:    Right Eye Near:   Left Eye Near:    Bilateral Near:     Physical Exam Constitutional:      General: She  is not in acute distress.    Appearance: Normal appearance. She is obese. She  is not ill-appearing or toxic-appearing.  Cardiovascular:     Rate and Rhythm: Normal rate and regular rhythm.     Pulses: Normal pulses.     Heart sounds: Normal heart sounds. No murmur. No gallop.   Pulmonary:     Effort: Pulmonary effort is normal. No respiratory distress.     Breath sounds: Normal breath sounds. No wheezing.  Chest:     Chest wall: No tenderness.  Musculoskeletal:        General: No swelling, deformity or signs of injury.     Comments: Left Ankle: decrease range of motion, Left ankle swelling and tenderness  Skin:    General: Skin is warm.     Capillary Refill: Capillary refill takes less than 2 seconds.     Coloration: Skin is not pale.     Findings: No rash.  Neurological:     Mental Status: She is alert and oriented to person, place, and time.     Gait: Gait abnormal.     Deep Tendon Reflexes: Reflexes normal.      UC Treatments / Results  Labs (all labs ordered are listed, but only abnormal results are displayed) Labs Reviewed - No data to display  EKG   Radiology No results found.  Procedures Procedures (including critical care time)  Medications Ordered in UC Medications - No data to display  Initial Impression / Assessment and Plan / UC Course  I have reviewed the triage vital signs and the nursing notes.  Pertinent labs & imaging results that were available during my care of the patient were reviewed by me and considered in my medical decision making (see chart for details).   X ray result show soft tissue swelling and no acute bone abnormality of the ankle joint. Patient was advised to use RICE and to follow up with ortho if symptom did not improve. Ace wrap was applied. Final Clinical Impressions(s) / UC Diagnoses   Final diagnoses:  Acute left ankle pain     Discharge Instructions     Please rest, ice and elevate the affected extremity. You may take  Ibuprofen 600mg  every 8 hours, as needed, for pain (take with food). Follow up with Orthopaedic Surgery for further evaluation. Please call for any appointment. Do not remove your splint. You may use a garbage bag while showering to keep your splint dry. Please return here if you are experiencing increased pain, tingling/numbness, swelling, redness, or fever.     ED Prescriptions    None     PDMP not reviewed this encounter.   Emerson Monte, [place] 04/15/19 1135

## 2019-04-15 NOTE — ED Triage Notes (Signed)
Pt presents to UC w/ c/o left ankle pain since yesterday. Pt states she rolled her left ankle stepping down from a step yesterday afternoon. Ankle is swollen and bruised.

## 2019-04-15 NOTE — Discharge Instructions (Addendum)
Your X-ray show soft tissue swelling.  No acute bone abnormality of the ankle joint. Please rest, ice and elevate the affected extremity. You may take Ibuprofen 600mg  every 8 hours, as needed, for pain (take with food).  Gentle Range of motion and bear weigh as tolerated Follow up with Orthopaedic Surgery for further evaluation. Please call for any appointment. Please return here if you are experiencing increased pain, tingling/numbness, swelling, redness, or fever.

## 2019-06-08 ENCOUNTER — Encounter: Payer: Self-pay | Admitting: Nurse Practitioner

## 2019-06-09 DIAGNOSIS — Z08 Encounter for follow-up examination after completed treatment for malignant neoplasm: Secondary | ICD-10-CM | POA: Diagnosis not present

## 2019-06-09 DIAGNOSIS — C44519 Basal cell carcinoma of skin of other part of trunk: Secondary | ICD-10-CM | POA: Diagnosis not present

## 2019-06-09 DIAGNOSIS — Z85820 Personal history of malignant melanoma of skin: Secondary | ICD-10-CM | POA: Diagnosis not present

## 2019-06-09 DIAGNOSIS — D225 Melanocytic nevi of trunk: Secondary | ICD-10-CM | POA: Diagnosis not present

## 2019-06-09 DIAGNOSIS — Z1283 Encounter for screening for malignant neoplasm of skin: Secondary | ICD-10-CM | POA: Diagnosis not present

## 2019-06-12 ENCOUNTER — Other Ambulatory Visit: Payer: Self-pay | Admitting: Nurse Practitioner

## 2019-06-17 ENCOUNTER — Encounter: Payer: Self-pay | Admitting: Nurse Practitioner

## 2019-06-20 ENCOUNTER — Encounter: Payer: Self-pay | Admitting: Nurse Practitioner

## 2019-06-24 ENCOUNTER — Ambulatory Visit: Payer: Medicare Other | Attending: Internal Medicine

## 2019-06-24 DIAGNOSIS — Z23 Encounter for immunization: Secondary | ICD-10-CM | POA: Insufficient documentation

## 2019-06-24 NOTE — Progress Notes (Signed)
   Covid-19 Vaccination Clinic  Name:  [name] [name]    MRN: [id number] DOB: 1945-06-06  06/24/2019  [name] [name] was observed post Covid-19 immunization for 15 minutes without incidence. She was provided with Vaccine Information Sheet and instruction to access the V-Safe system.   [name] [name] was instructed to call 911 with any severe reactions post vaccine: Mar[place] Kitchen Difficulty breathing  . Swelling of your face and throat  . A fast heartbeat  . A bad rash all over your body  . Dizziness and weakness    Immunizations Administered    Name Date Dose VIS Date Route   Pfizer COVID-19 Vaccine 06/24/2019  6:42 PM 0.3 mL 05/13/2019 Intramuscular   Manufacturer: [place]   Lot: [id number]   [place]: [id number]

## 2019-07-15 ENCOUNTER — Ambulatory Visit: Payer: Medicare Other | Attending: Internal Medicine

## 2019-07-15 DIAGNOSIS — Z23 Encounter for immunization: Secondary | ICD-10-CM | POA: Insufficient documentation

## 2019-07-15 NOTE — Progress Notes (Signed)
   Covid-19 Vaccination Clinic  Name:  [name] [name]    MRN: [id number] DOB: [birth year]  07/15/2019  [name] was observed post Covid-19 immunization for 15 minutes without incidence. She was provided with Vaccine Information Sheet and instruction to access the V-Safe system.   [name] was instructed to call 911 with any severe reactions post vaccine: Marland Kitchen Difficulty breathing  . Swelling of your face and throat  . A fast heartbeat  . A bad rash all over your body  . Dizziness and weakness    Immunizations Administered    Name Date Dose VIS Date Route   Pfizer COVID-19 Vaccine 07/15/2019  8:35 AM 0.3 mL 05/13/2019 Intramuscular   Manufacturer: [place]   Lot: [id number]   [place]: [id number]

## 2019-07-25 ENCOUNTER — Encounter: Payer: Self-pay | Admitting: Nurse Practitioner

## 2019-08-22 ENCOUNTER — Ambulatory Visit (INDEPENDENT_AMBULATORY_CARE_PROVIDER_SITE_OTHER): Payer: Medicare Other | Admitting: Nurse Practitioner

## 2019-08-22 ENCOUNTER — Encounter: Payer: Self-pay | Admitting: Nurse Practitioner

## 2019-08-22 ENCOUNTER — Other Ambulatory Visit: Payer: Self-pay

## 2019-08-22 VITALS — BP 128/90 | HR 60 | Temp 97.1°F | Ht 64.0 in | Wt 183.0 lb

## 2019-08-22 DIAGNOSIS — Z1159 Encounter for screening for other viral diseases: Secondary | ICD-10-CM | POA: Diagnosis not present

## 2019-08-22 DIAGNOSIS — I10 Essential (primary) hypertension: Secondary | ICD-10-CM

## 2019-08-22 DIAGNOSIS — F5101 Primary insomnia: Secondary | ICD-10-CM

## 2019-08-22 DIAGNOSIS — E785 Hyperlipidemia, unspecified: Secondary | ICD-10-CM | POA: Diagnosis not present

## 2019-08-22 DIAGNOSIS — R202 Paresthesia of skin: Secondary | ICD-10-CM

## 2019-08-22 DIAGNOSIS — K219 Gastro-esophageal reflux disease without esophagitis: Secondary | ICD-10-CM | POA: Diagnosis not present

## 2019-08-22 NOTE — Progress Notes (Deleted)
Careteam: Patient Care Team: [name], NP as PCP - General (Geriatric Medicine) Warden Fillers, MD as Consulting Physician (Ophthalmology)  PLACE OF SERVICE:  [place] Directive information Does Patient Have a Medical Advance Directive?: Yes, Type of Advance Directive: Living will, Does patient want to make changes to medical advance directive?: No - Patient declined  No Known Allergies  Chief Complaint  Patient presents with  . Medical Management of Chronic Issues    6 month follow-up   . Numbness    Patient c/o numbness in feet off/on and nubness in 3 fingers on left hand     HPI: Patient is a 74 y.o. female ***  Review of Systems:  ROS***  Past Medical History:  Diagnosis Date  . Acid reflux   . Bowel obstruction ([place])   . H/O hiatal hernia   . Hyperlipidemia   . Hypertension    Past Surgical History:  Procedure Laterality Date  . ABDOMINAL HYSTERECTOMY    . APPENDECTOMY    . COLONOSCOPY  03/18/2012   Procedure: COLONOSCOPY;  Surgeon: Rogene Houston, MD;  [facility];  Service: Endoscopy;  Laterality: N/A;  930  . LAPAROTOMY N/A 02/14/2013   Procedure: EXPLORATORY LAPAROTOMY;  Surgeon: Jamesetta So, MD;  Location: [facility];  Service: General;  Laterality: N/A;  . MELANOMA EXCISION  03/2019   Dr.Hall, removed from nack   . 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 Dr. Nevada Crane, [facility] Reichmann    Social History:   reports that she has never smoked. She has never used smokeless tobacco. She reports that she does not drink alcohol or use drugs.  Family History  Problem Relation Age of Onset  . Cancer - Other Mother        Kidney  . Fibromyalgia Mother   . Arthritis Mother   . Cancer - Lung Father 50  . Heart disease Brother 1       had heart transplant then died 3 years later after restarting smoking    Medications: Patient's Medications  New Prescriptions   No medications on file  Previous Medications   AMLODIPINE (NORVASC) 5 MG TABLET    TAKE 1 TABLET DAILY   ASPIRIN EC 81 MG TABLET    Take 81 mg by mouth daily.   ATORVASTATIN (LIPITOR) 10 MG TABLET    TAKE 1 TABLET DAILY   BETA CAROTENE W/MINERALS (OCUVITE) TABLET    Take 1 tablet by mouth daily.   CALCIUM CARBONATE-VITAMIN D (CALCIUM 600 + D PO)    Take 1 tablet by mouth 2 (two) times daily.   CHOLECALCIFEROL (VITAMIN D3) 250 MCG (10000 UT) CAPSULE    Take 10,000 Units by mouth daily.   DOCUSATE SODIUM (COLACE) 100 MG CAPSULE    Take 100 mg by mouth 2 (two) times daily.   FISH OIL-CHOLECALCIFEROL (FISH OIL + D3) 1000-1000 MG-UNIT CAPS    Take 1 capsule by mouth 2 (two) times daily.   IBUPROFEN (ADVIL) 600 MG TABLET    Take 1 tablet (600 mg total) by mouth every 8 (eight) hours as needed for moderate pain.   LACTOBACILLUS-INULIN (CULTURELLE DIGESTIVE HEALTH PO)    Take 1 capsule by mouth daily.    LOSARTAN (COZAAR) 50 MG TABLET    Take 50 mg by mouth daily.   MISC NATURAL PRODUCTS (OSTEO BI-FLEX ADV DOUBLE ST PO)    Take 1 tablet by mouth 2 (two) times daily.  MULTIPLE VITAMIN (MULTIVITAMIN WITH MINERALS) TABS    Take 1 tablet by mouth daily.   OMEPRAZOLE (PRILOSEC) 40 MG CAPSULE    TAKE 1 CAPSULE DAILY   POLYETHYLENE GLYCOL POWDER (GLYCOLAX/MIRALAX) POWDER    Take 17 g by mouth daily.   ZINC GLUCONATE 50 MG TABLET    Take 50 mg by mouth daily.  Modified Medications   No medications on file  Discontinued Medications   CHOLECALCIFEROL (VITAMIN D) 1000 UNITS TABLET    Take 5,000 Units by mouth daily.     Physical Exam:   Vitals:   08/22/19 1057  BP: 128/90  Pulse: 60  Temp: (!) 97.1 F (36.2 C)  TempSrc: Temporal  SpO2: 93%  Weight: 183 lb (83 kg)  Height: 5\' 4"  (1.626 m)   Body mass index is 31.41 kg/m. Wt Readings from Last 3 Encounters:  08/22/19 183 lb (83 kg)  02/14/19 182 lb (82.6 kg)  02/14/19 182 lb (82.6 kg)    Physical Exam***  Labs reviewed: Basic Metabolic Panel: Recent Labs    03/21/19 0806  NA 143  K  4.4  CL 107  CO2 30  GLUCOSE 91  BUN 13  CREATININE 0.60  CALCIUM 9.2   Liver Function Tests: Recent Labs    03/21/19 0806  AST 26  ALT 30*  BILITOT 0.7  PROT 6.3   No results for input(s): LIPASE, AMYLASE in the last 8760 hours. No results for input(s): AMMONIA in the last 8760 hours. CBC: Recent Labs    03/21/19 0806  WBC 7.6  NEUTROABS 4,119  HGB 14.3  HCT 42.2  MCV 93.4  PLT 258   Lipid Panel: Recent Labs    03/21/19 0806  CHOL 120  HDL 40*  LDLCALC 53  TRIG 199*  CHOLHDL 3.0   TSH: No results for input(s): TSH in the last 8760 hours. A1C: Lab Results  Component Value Date   HGBA1C 5.6 03/21/2019     Assessment/Plan There are no diagnoses linked to this encounter.  Next appt: *** Velvet [place], [facility] [phone]

## 2019-08-22 NOTE — Patient Instructions (Addendum)
Take fit bit off to sleep Wear brace to left hand  To schedule fasting lab work.  To schedule 1 month virtual visit for MOST form/advance direction  6 month routine follow up    Insomnia Insomnia is a sleep disorder that makes it difficult to fall asleep or stay asleep. Insomnia can cause fatigue, low energy, difficulty concentrating, mood swings, and poor performance at work or school. There are three different ways to classify insomnia:  Difficulty falling asleep.  Difficulty staying asleep.  Waking up too early in the morning. Any type of insomnia can be long-term (chronic) or short-term (acute). Both are common. Short-term insomnia usually lasts for three months or less. Chronic insomnia occurs at least three times a week for longer than three months. What are the causes? Insomnia may be caused by another condition, situation, or substance, such as:  Anxiety.  Certain medicines.  Gastroesophageal reflux disease (GERD) or other gastrointestinal conditions.  Asthma or other breathing conditions.  Restless legs syndrome, sleep apnea, or other sleep disorders.  Chronic pain.  Menopause.  Stroke.  Abuse of alcohol, tobacco, or illegal drugs.  Mental health conditions, such as depression.  Caffeine.  Neurological disorders, such as Alzheimer's disease.  An overactive thyroid (hyperthyroidism). Sometimes, the cause of insomnia may not be known. What increases the risk? Risk factors for insomnia include:  Gender. Women are affected more often than men.  Age. Insomnia is more common as you get older.  Stress.  Lack of exercise.  Irregular work schedule or working night shifts.  Traveling between different time zones.  Certain medical and mental health conditions. What are the signs or symptoms? If you have insomnia, the main symptom is having trouble falling asleep or having trouble staying asleep. This may lead to other symptoms, such as:  Feeling  fatigued or having low energy.  Feeling nervous about going to sleep.  Not feeling rested in the morning.  Having trouble concentrating.  Feeling irritable, anxious, or depressed. How is this diagnosed? This condition may be diagnosed based on:  Your symptoms and medical history. Your health care provider may ask about: ? Your sleep habits. ? Any medical conditions you have. ? Your mental health.  A physical exam. How is this treated? Treatment for insomnia depends on the cause. Treatment may focus on treating an underlying condition that is causing insomnia. Treatment may also include:  Medicines to help you sleep.  Counseling or therapy.  Lifestyle adjustments to help you sleep better. Follow these instructions at home: Eating and drinking   Limit or avoid alcohol, caffeinated beverages, and cigarettes, especially close to bedtime. These can disrupt your sleep.  Do not eat a large meal or eat spicy foods right before bedtime. This can lead to digestive discomfort that can make it hard for you to sleep. Sleep habits   Keep a sleep diary to help you and your health care provider figure out what could be causing your insomnia. Write down: ? When you sleep. ? When you wake up during the night. ? How well you sleep. ? How rested you feel the next day. ? Any side effects of medicines you are taking. ? What you eat and drink.  Make your bedroom a dark, comfortable place where it is easy to fall asleep. ? Put up shades or blackout curtains to block light from outside. ? Use a white noise machine to block noise. ? Keep the temperature cool.  Limit screen use before bedtime. This includes: ? Watching  TV. ? Using your smartphone, tablet, or computer.  Stick to a routine that includes going to bed and waking up at the same times every day and night. This can help you fall asleep faster. Consider making a quiet activity, such as reading, part of your nighttime  routine.  Try to avoid taking naps during the day so that you sleep better at night.  Get out of bed if you are still awake after 15 minutes of trying to sleep. Keep the lights down, but try reading or doing a quiet activity. When you feel sleepy, go back to bed. General instructions  Take over-the-counter and prescription medicines only as told by your health care provider.  Exercise regularly, as told by your health care provider. Avoid exercise starting several hours before bedtime.  Use relaxation techniques to manage stress. Ask your health care provider to suggest some techniques that may work well for you. These may include: ? Breathing exercises. ? Routines to release muscle tension. ? Visualizing peaceful scenes.  Make sure that you drive carefully. Avoid driving if you feel very sleepy.  Keep all follow-up visits as told by your health care provider. This is important. Contact a health care provider if:  You are tired throughout the day.  You have trouble in your daily routine due to sleepiness.  You continue to have sleep problems, or your sleep problems get worse. Get help right away if:  You have serious thoughts about hurting yourself or someone else. If you ever feel like you may hurt yourself or others, or have thoughts about taking your own life, get help right away. You can go to your nearest emergency department or call:  Your local emergency services (911 in the [place]  A suicide crisis helpline, such as the [facility] at (216)091-0678. This is open 24 hours a day. Summary  Insomnia is a sleep disorder that makes it difficult to fall asleep or stay asleep.  Insomnia can be long-term (chronic) or short-term (acute).  Treatment for insomnia depends on the cause. Treatment may focus on treating an underlying condition that is causing insomnia.  Keep a sleep diary to help you and your health care provider figure out what could be  causing your insomnia. This information is not intended to replace advice given to you by your health care provider. Make sure you discuss any questions you have with your health care provider. Document Revised: 05/01/2017 Document Reviewed: 02/26/2017 Elsevier Patient Education  2020 [employer].

## 2019-08-22 NOTE — Progress Notes (Signed)
Careteam: Patient Care Team: [name], NP as PCP - General (Geriatric Medicine) Warden Fillers, MD as Consulting Physician (Ophthalmology)  PLACE OF SERVICE:  [place] Directive information Does Patient Have a Medical Advance Directive?: Yes, Type of Advance Directive: Living will, Does patient want to make changes to medical advance directive?: No - Patient declined  No Known Allergies  Chief Complaint  Patient presents with  . Medical Management of Chronic Issues    6 month follow-up   . Numbness    Patient c/o numbness in feet off/on and nubness in 3 fingers on left hand     HPI: Patient is a 74 y.o. female is here for routine follow-up.  she states that she has some numbness and tingling in her left middle three fingertips. Denies burning, pain, or weakness. 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  Does not have any trouble picking things up or holding on to things. States that she sleeps with her wrist bent in an odd angle (to hold her pillow) and wears a fitbit on her left wrist. Notices more at night. Denies back, elbow or wrist pain.  Bilateral numbness and tingling on the bottom of her feet that is on and off. No trouble walking or weakness. No changes in sensation. Will get worse in the evening as well. Denies back pain.   Hypertension - taking norvasc and cozaar daily. Does not follow a low sodium diet.   She states that her bowels have been moving good on her current regimen.   Trouble sleeping - has difficulty staying asleep. Taking melatonin which helps some but she has been waking up at 2 am without being able to go back to sleep. States she watches TV in bed and puts it on a timer to go off. She avoids caffeine in the afternoon. Advised on importance of sleep hygiene    GERD - controlled on omeprazole and dietary modifications.   Review of Systems:    Review of Systems  Constitutional: Negative for chills and fever.  HENT: Negative for ear discharge, ear pain and hearing loss.   Eyes: Negative.  Negative for blurred vision, double vision and pain.  Respiratory: Negative for cough and shortness of breath.   Cardiovascular: Negative for chest pain and leg swelling.  Gastrointestinal: Negative for abdominal pain, constipation, diarrhea, nausea and vomiting.  Genitourinary: Negative.  Negative for dysuria, frequency and urgency.  Musculoskeletal: Negative.  Negative for myalgias.  Skin: Negative.   Neurological: Positive for tingling. Negative for dizziness, sensory change, weakness and headaches.  Endo/Heme/Allergies: Negative.   Psychiatric/Behavioral: Negative.     Past Medical History:  Diagnosis Date  . Acid reflux   . Bowel obstruction ([place])   . H/O hiatal hernia   . Hyperlipidemia   . Hypertension    Past Surgical History:  Procedure Laterality Date  . ABDOMINAL HYSTERECTOMY    . APPENDECTOMY    . COLONOSCOPY  03/18/2012   Procedure: COLONOSCOPY;  Surgeon: Rogene Houston, MD;  [facility];  Service: Endoscopy;  Laterality: N/A;  930  . LAPAROTOMY N/A 02/14/2013   Procedure: EXPLORATORY LAPAROTOMY;  Surgeon: Jamesetta So, MD;  Location: [facility];  Service: General;  Laterality: N/A;  . MELANOMA EXCISION  03/2019   Dr.Hall, removed from nack   . SKIN SURGERY  02/2019   Melanoma removed from upper  back. Dr. Nevada Crane, [facility]    Social History:   reports that she has never smoked. She has never used smokeless tobacco. She reports that she does not drink alcohol or use drugs.  Family History  Problem Relation Age of Onset  . Cancer - Other Mother        Kidney  . Fibromyalgia Mother   . Arthritis Mother   . Cancer - Lung Father 11  . Heart disease Brother 103       had heart transplant then died 3 years later after restarting smoking    Medications: Patient's Medications  New Prescriptions   No medications  on file  Previous Medications   AMLODIPINE (NORVASC) 5 MG TABLET    TAKE 1 TABLET DAILY   ASPIRIN EC 81 MG TABLET    Take 81 mg by mouth daily.   ATORVASTATIN (LIPITOR) 10 MG TABLET    TAKE 1 TABLET DAILY   BETA CAROTENE W/MINERALS (OCUVITE) TABLET    Take 1 tablet by mouth daily.   CALCIUM CARBONATE-VITAMIN D (CALCIUM 600 + D PO)    Take 1 tablet by mouth 2 (two) times daily.   CHOLECALCIFEROL (VITAMIN D3) 250 MCG (10000 UT) CAPSULE    Take 10,000 Units by mouth daily.   DOCUSATE SODIUM (COLACE) 100 MG CAPSULE    Take 100 mg by mouth 2 (two) times daily.   FISH OIL-CHOLECALCIFEROL (FISH OIL + D3) 1000-1000 MG-UNIT CAPS    Take 1 capsule by mouth 2 (two) times daily.   IBUPROFEN (ADVIL) 600 MG TABLET    Take 1 tablet (600 mg total) by mouth every 8 (eight) hours as needed for moderate pain.   LACTOBACILLUS-INULIN (CULTURELLE DIGESTIVE HEALTH PO)    Take 1 capsule by mouth daily.    LOSARTAN (COZAAR) 50 MG TABLET    Take 50 mg by mouth daily.   MISC NATURAL PRODUCTS (OSTEO BI-FLEX ADV DOUBLE ST PO)    Take 1 tablet by mouth 2 (two) times daily.   MULTIPLE VITAMIN (MULTIVITAMIN WITH MINERALS) TABS    Take 1 tablet by mouth daily.   OMEPRAZOLE (PRILOSEC) 40 MG CAPSULE    TAKE 1 CAPSULE DAILY   POLYETHYLENE GLYCOL POWDER (GLYCOLAX/MIRALAX) POWDER    Take 17 g by mouth daily.   ZINC GLUCONATE 50 MG TABLET    Take 50 mg by mouth daily.  Modified Medications   No medications on file  Discontinued Medications   CHOLECALCIFEROL (VITAMIN D) 1000 UNITS TABLET    Take 5,000 Units by mouth daily.     Physical Exam:  Vitals:   08/22/19 1057  BP: 128/90  Pulse: 60  Temp: (!) 97.1 F (36.2 C)  TempSrc: Temporal  SpO2: 93%  Weight: 183 lb (83 kg)  Height: 5\' 4"  (1.626 m)   Body mass index is 31.41 kg/m. Wt Readings from Last 3 Encounters:  08/22/19 183 lb (83 kg)  02/14/19 182 lb (82.6 kg)  02/14/19 182 lb (82.6 kg)    Physical Exam Constitutional:      Appearance: Normal appearance.    HENT:     Head: Normocephalic and atraumatic.  Eyes:     Pupils: Pupils are equal, round, and reactive to light.  Cardiovascular:     Rate and Rhythm: Normal rate and regular rhythm.     Pulses: Normal pulses.     Heart sounds: Normal heart sounds.  Pulmonary:     Effort: Pulmonary effort is normal.     Breath sounds: Normal breath  sounds.  Abdominal:     General: Abdomen is flat. Bowel sounds are normal.     Palpations: Abdomen is soft.  Musculoskeletal:        General: No tenderness. Normal range of motion.     Cervical back: Normal range of motion and neck supple.  Skin:    General: Skin is warm and dry.     Capillary Refill: Capillary refill takes less than 2 seconds.  Neurological:     Mental Status: She is alert and oriented to person, place, and time.     Cranial Nerves: No cranial nerve deficit.     Sensory: Sensation is intact. No sensory deficit.     Motor: No weakness.     Coordination: Coordination normal.  Psychiatric:        Mood and Affect: Mood normal.        Behavior: Behavior normal.     Labs reviewed: Basic Metabolic Panel: Recent Labs    03/21/19 0806  NA 143  K 4.4  CL 107  CO2 30  GLUCOSE 91  BUN 13  CREATININE 0.60  CALCIUM 9.2   Liver Function Tests: Recent Labs    03/21/19 0806  AST 26  ALT 30*  BILITOT 0.7  PROT 6.3   No results for input(s): LIPASE, AMYLASE in the last 8760 hours. No results for input(s): AMMONIA in the last 8760 hours. CBC: Recent Labs    03/21/19 0806  WBC 7.6  NEUTROABS 4,119  HGB 14.3  HCT 42.2  MCV 93.4  PLT 258   Lipid Panel: Recent Labs    03/21/19 0806  CHOL 120  HDL 40*  LDLCALC 53  TRIG 199*  CHOLHDL 3.0   TSH: No results for input(s): TSH in the last 8760 hours. A1C: Lab Results  Component Value Date   HGBA1C 5.6 03/21/2019    Assessment/Plan 1. Essential hypertension -stable, dbp slightly high, Continue on amlodipine and cozaar -Encouraged to follow a low sodium diet    2. Hyperlipidemia, unspecified hyperlipidemia type -Continue atorvastatin 10 mg daily -pt will come back tomorrow for fasting labs, noted to have elevated triglycerides on last labs. Dietary modifications encouraged.  3. Gastroesophageal reflux disease without esophagitis -stable, Continue omeprazole 40 mg daily  4. Paresthesia of both feet -possibly due to elevated blood sugars, will follow up labs at this time. B12, tsh, cbc, cmp. Consider starting gabapentin qhs  5. Paresthesia of finger -discussed better posture for sleep and wearing a brace. Possible early carpel tunnel. Will obtain blood work -also to stop wearing fitbit on wrist at bedtime.  6. Insomnia -sleep hygiene discussed and information provided -to continue melatonin qhs  7. Will obatin hep c screen with next labs  Next appt: Carlos American. Buelah Rennie, [facility] [phone]  I personally was present during the history, physical exam and medical decision-making activities of this service and have verified that the service and findings are accurately documented in the student's note

## 2019-08-23 ENCOUNTER — Other Ambulatory Visit: Payer: Medicare Other

## 2019-08-23 DIAGNOSIS — E785 Hyperlipidemia, unspecified: Secondary | ICD-10-CM

## 2019-08-23 DIAGNOSIS — R202 Paresthesia of skin: Secondary | ICD-10-CM | POA: Diagnosis not present

## 2019-08-23 DIAGNOSIS — I10 Essential (primary) hypertension: Secondary | ICD-10-CM

## 2019-08-23 DIAGNOSIS — Z1159 Encounter for screening for other viral diseases: Secondary | ICD-10-CM

## 2019-08-24 ENCOUNTER — Other Ambulatory Visit: Payer: Self-pay

## 2019-08-24 LAB — HEPATITIS C ANTIBODY
Hepatitis C Ab: NONREACTIVE
SIGNAL TO CUT-OFF: 0.01 (ref <1.00)

## 2019-08-24 LAB — COMPLETE METABOLIC PANEL WITH GFR
AG Ratio: 1.6 (calc) (ref 1.0–2.5)
ALT: 28 U/L (ref 6–29)
AST: 26 U/L (ref 10–35)
Albumin: 4.1 g/dL (ref 3.6–5.1)
Alkaline phosphatase (APISO): 77 U/L (ref 37–153)
BUN: 12 mg/dL (ref 7–25)
CO2: 28 mmol/L (ref 20–32)
Calcium: 9.6 mg/dL (ref 8.6–10.4)
Chloride: 105 mmol/L (ref 98–110)
Creat: 0.74 mg/dL (ref 0.60–0.93)
GFR, Est African American: 94 mL/min/{1.73_m2} (ref >=60)
GFR, Est Non African American: 81 mL/min/{1.73_m2} (ref >=60)
Globulin: 2.6 g/dL (calc) (ref 1.9–3.7)
Glucose, Bld: 89 mg/dL (ref 65–99)
Potassium: 4.1 mmol/L (ref 3.5–5.3)
Sodium: 142 mmol/L (ref 135–146)
Total Bilirubin: 0.6 mg/dL (ref 0.2–1.2)
Total Protein: 6.7 g/dL (ref 6.1–8.1)

## 2019-08-24 LAB — TSH: TSH: 1.87 mIU/L (ref 0.40–4.50)

## 2019-08-24 LAB — CBC WITH DIFFERENTIAL/PLATELET
Absolute Monocytes: 549 cells/uL (ref 200–950)
Basophils Absolute: 110 cells/uL (ref 0–200)
Basophils Relative: 1.8 %
Eosinophils Absolute: 189 cells/uL (ref 15–500)
Eosinophils Relative: 3.1 %
HCT: 43.4 % (ref 35.0–45.0)
Hemoglobin: 14.7 g/dL (ref 11.7–15.5)
Lymphs Abs: 2214 cells/uL (ref 850–3900)
MCH: 32.1 pg (ref 27.0–33.0)
MCHC: 33.9 g/dL (ref 32.0–36.0)
MCV: 94.8 fL (ref 80.0–100.0)
MPV: 12.6 fL — ABNORMAL HIGH (ref 7.5–12.5)
Monocytes Relative: 9 %
Neutro Abs: 3038 cells/uL (ref 1500–7800)
Neutrophils Relative %: 49.8 %
Platelets: 271 10*3/uL (ref 140–400)
RBC: 4.58 10*6/uL (ref 3.80–5.10)
RDW: 12.1 % (ref 11.0–15.0)
Total Lymphocyte: 36.3 %
WBC: 6.1 10*3/uL (ref 3.8–10.8)

## 2019-08-24 LAB — LIPID PANEL
Cholesterol: 99 mg/dL (ref <200)
HDL: 39 mg/dL — ABNORMAL LOW (ref >=50)
LDL Cholesterol (Calc): 38 mg/dL (calc)
Non-HDL Cholesterol (Calc): 60 mg/dL (calc) (ref <130)
Total CHOL/HDL Ratio: 2.5 (calc) (ref <5.0)
Triglycerides: 135 mg/dL (ref <150)

## 2019-08-24 LAB — VITAMIN B12: Vitamin B-12: 445 pg/mL (ref 200–1100)

## 2019-08-24 MED ORDER — VITAMIN B-12 1000 MCG PO TABS: 1000.0000 ug | ORAL_TABLET | Freq: Every day | ORAL | 11 refills | Status: AC

## 2019-09-12 DIAGNOSIS — Z1283 Encounter for screening for malignant neoplasm of skin: Secondary | ICD-10-CM | POA: Diagnosis not present

## 2019-09-12 DIAGNOSIS — D225 Melanocytic nevi of trunk: Secondary | ICD-10-CM | POA: Diagnosis not present

## 2019-09-12 DIAGNOSIS — Z08 Encounter for follow-up examination after completed treatment for malignant neoplasm: Secondary | ICD-10-CM | POA: Diagnosis not present

## 2019-09-12 DIAGNOSIS — Z85828 Personal history of other malignant neoplasm of skin: Secondary | ICD-10-CM | POA: Diagnosis not present

## 2019-09-12 DIAGNOSIS — Z85820 Personal history of malignant melanoma of skin: Secondary | ICD-10-CM | POA: Diagnosis not present

## 2019-09-19 ENCOUNTER — Encounter: Payer: Self-pay | Admitting: Nurse Practitioner

## 2019-09-19 ENCOUNTER — Telehealth: Payer: Self-pay

## 2019-09-19 ENCOUNTER — Other Ambulatory Visit: Payer: Self-pay

## 2019-09-19 ENCOUNTER — Telehealth (INDEPENDENT_AMBULATORY_CARE_PROVIDER_SITE_OTHER): Payer: Medicare Other | Admitting: Nurse Practitioner

## 2019-09-19 VITALS — Wt 179.0 lb

## 2019-09-19 DIAGNOSIS — Z7189 Other specified counseling: Secondary | ICD-10-CM

## 2019-09-19 NOTE — Telephone Encounter (Signed)
Ms. marai, maros are scheduled for a virtual visit with your provider today.    Just as we do with appointments in the office, we must obtain your consent to participate.  Your consent will be active for this visit and any virtual visit you may have with one of our providers in the next 365 days.    If you have a MyChart account, I can also send a copy of this consent to you electronically.  All virtual visits are billed to your insurance company just like a traditional visit in the office.  As this is a virtual visit, video technology does not allow for your provider to perform a traditional examination.  This may limit your provider's ability to fully assess your condition.  If your provider identifies any concerns that need to be evaluated in person or the need to arrange testing such as labs, EKG, etc, we will make arrangements to do so.    Although advances in technology are sophisticated, we cannot ensure that it will always work on either your end or our end.  If the connection with a video visit is poor, we may have to switch to a telephone visit.  With either a video or telephone visit, we are not always able to ensure that we have a secure connection.   I need to obtain your verbal consent now.   Are you willing to proceed with your visit today?   Nyelle Mykeia Wingerter has provided verbal consent on 09/19/2019 for a virtual visit (video or telephone).   Leigh Aurora Radisson, [place] 09/19/2019  9:01 AM

## 2019-09-19 NOTE — Progress Notes (Signed)
This service is provided via telemedicine  No vital signs collected/recorded due to the encounter was a telemedicine visit. Patient  checked recorded weight at home.  Location of patient (ex: home, work):  Home  Patient consents to a telephone visit:  Yes, see telephone encounter for annual consent dated 09/19/2019.   Location of the provider (ex: office, home):  [facility], Office   Name of any referring provider:  N/A  Names of all persons participating in the telemedicine service and their role in the encounter:  S.Chrae B/CMA, Sherrie Mustache, NP, and Patient   Time spent on call:  5 min with medical assistant       Careteam: Patient Care Team: [name], NP as PCP - General (Geriatric Medicine) Warden Fillers, MD as Consulting Physician (Ophthalmology)  PLACE OF SERVICE:  [place] Directive information Does Patient Have a Medical Advance Directive?: Yes, Type of Advance Directive: [place];Living will, Does patient want to make changes to medical advance directive?: No - Patient declined  No Known Allergies  Chief Complaint  Patient presents with  . Advanced Directive    Advance Care Planning, discuss MOST form. Telehealth/Video visit.      HPI: Patient is a 74 y.o. female for virtual visit to complete MOST form.  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   Reports she does want to be resuscitated if she has no pulse or not breathing but would like an advanced airway for a short time if needed.   Review of Systems:  Review of Systems  Constitutional: Negative for chills, fever and malaise/fatigue.  All other systems reviewed and are negative.   Past Medical History:  Diagnosis Date  . Acid reflux   . Bowel obstruction ([place])   . H/O hiatal hernia   . Hyperlipidemia   . Hypertension    Past Surgical History:  Procedure Laterality Date  .  ABDOMINAL HYSTERECTOMY    . APPENDECTOMY    . COLONOSCOPY  03/18/2012   Procedure: COLONOSCOPY;  Surgeon: Rogene Houston, MD;  [facility];  Service: Endoscopy;  Laterality: N/A;  930  . LAPAROTOMY N/A 02/14/2013   Procedure: EXPLORATORY LAPAROTOMY;  Surgeon: Jamesetta So, MD;  Location: [facility];  Service: General;  Laterality: N/A;  . MELANOMA EXCISION  03/2019   Dr.Hall, removed from nack   . 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 Dr. Nevada Crane, [facility]    Social History:   reports that she has never smoked. She has never used smokeless tobacco. She reports that she does not drink alcohol or use drugs.  Family History  Problem Relation Age of Onset  . Cancer - Other Mother        Kidney  . Fibromyalgia Mother   . Arthritis Mother   . Cancer - Lung Father 60  . Heart disease Brother 53       had heart transplant then died 3 years later after restarting smoking    Medications: Patient's Medications  New Prescriptions   No medications on file  Previous Medications   AMLODIPINE (NORVASC) 5 MG TABLET    TAKE 1 TABLET DAILY   ASPIRIN EC 81 MG TABLET    Take 81 mg by mouth daily.   ATORVASTATIN (LIPITOR) 10 MG TABLET    TAKE 1 TABLET DAILY   BETA CAROTENE W/MINERALS (OCUVITE) TABLET    Take 1  tablet by mouth daily.   CALCIUM CARBONATE-VITAMIN D (CALCIUM 600 + D PO)    Take 1 tablet by mouth 2 (two) times daily.   CHOLECALCIFEROL (VITAMIN D3) 250 MCG (10000 UT) CAPSULE    Take 10,000 Units by mouth daily.   DOCUSATE SODIUM (COLACE) 100 MG CAPSULE    Take 100 mg by mouth 2 (two) times daily.   FISH OIL-CHOLECALCIFEROL (FISH OIL + D3) 1000-1000 MG-UNIT CAPS    Take 1 capsule by mouth 2 (two) times daily.   IBUPROFEN (ADVIL) 600 MG TABLET    Take 1 tablet (600 mg total) by mouth every 8 (eight) hours as needed for moderate pain.   LACTOBACILLUS-INULIN (CULTURELLE DIGESTIVE HEALTH PO)    Take 1 capsule by mouth daily.    LOSARTAN (COZAAR) 50 MG TABLET    Take 50  mg by mouth daily.   MISC NATURAL PRODUCTS (OSTEO BI-FLEX ADV DOUBLE ST PO)    Take 1 tablet by mouth 2 (two) times daily.   MULTIPLE VITAMIN (MULTIVITAMIN WITH MINERALS) TABS    Take 1 tablet by mouth daily.   OMEPRAZOLE (PRILOSEC) 40 MG CAPSULE    TAKE 1 CAPSULE DAILY   POLYETHYLENE GLYCOL POWDER (GLYCOLAX/MIRALAX) POWDER    Take 17 g by mouth daily.   VITAMIN B-12 (CYANOCOBALAMIN) 1000 MCG TABLET    Take 1 tablet (1,000 mcg total) by mouth daily.   ZINC GLUCONATE 50 MG TABLET    Take 50 mg by mouth daily.  Modified Medications   No medications on file  Discontinued Medications   No medications on file    Physical Exam:  Vitals:   [name] 0858  Weight: 179 lb (81.2 kg)   Body mass index is 30.73 kg/m. Wt Readings from Last 3 Encounters:  [name] 179 lb (81.2 kg)  08/22/19 183 lb (83 kg)  02/14/19 182 lb (82.6 kg)    Labs reviewed: Basic Metabolic Panel: Recent Labs    03/21/19 0806 08/23/19 0833  NA 143 142  K 4.4 4.1  CL 107 105  CO2 30 28  GLUCOSE 91 89  BUN 13 12  CREATININE 0.60 0.74  CALCIUM 9.2 9.6  TSH  --  1.87   Liver Function Tests: Recent Labs    03/21/19 0806 08/23/19 0833  AST 26 26  ALT 30* 28  BILITOT 0.7 0.6  PROT 6.3 6.7   No results for input(s): LIPASE, AMYLASE in the last 8760 hours. No results for input(s): AMMONIA in the last 8760 hours. CBC: Recent Labs    03/21/19 0806 08/23/19 0833  WBC 7.6 6.1  NEUTROABS 4,119 3,038  HGB 14.3 14.7  HCT 42.2 43.4  MCV 93.4 94.8  PLT 258 271   Lipid Panel: Recent Labs    03/21/19 0806 08/23/19 0833  CHOL 120 99  HDL 40* 39*  LDLCALC 53 38  TRIG 199* 135  CHOLHDL 3.0 2.5   TSH: Recent Labs    08/23/19 0833  TSH 1.87   A1C: Lab Results  Component Value Date   HGBA1C 5.6 03/21/2019     Assessment/Plan 1. Advance care planning -MOST form completed and all questions answered - DNR (Do Not Resuscitate) -will print out and mail copies of document.    Carlos American.  Harle Battiest  [facility] [phone]   Virtual Visit via Video Note  I connected with [name] [name] at  9:00 AM EDT by a video enabled telemedicine application and verified that I  am speaking with the correct person using two identifiers.  Location: Patient: home Provider: office    I discussed the limitations of evaluation and management by telemedicine and the availability of in person appointments. The patient expressed understanding and agreed to proceed.    I discussed the assessment and treatment plan with the patient. The patient was provided an opportunity to ask questions and all were answered. The patient agreed with the plan and demonstrated an understanding of the instructions.   The patient was advised to call back or seek an in-person evaluation if the symptoms worsen or if the condition fails to improve as anticipated.  I provided 12 minutes of non-face-to-face time during this encounter.  Carlos American. Dewaine Oats, AGNP Avs printed and mailed.

## 2019-10-03 ENCOUNTER — Encounter: Payer: Self-pay | Admitting: Nurse Practitioner

## 2019-10-04 ENCOUNTER — Ambulatory Visit (INDEPENDENT_AMBULATORY_CARE_PROVIDER_SITE_OTHER): Payer: Medicare Other | Admitting: Family

## 2019-10-04 ENCOUNTER — Other Ambulatory Visit: Payer: Self-pay

## 2019-10-04 ENCOUNTER — Ambulatory Visit: Payer: Medicare Other | Admitting: Family

## 2019-10-04 ENCOUNTER — Encounter: Payer: Self-pay | Admitting: Family

## 2019-10-04 VITALS — BP 110/80 | HR 68 | Temp 97.8°F | Resp 16 | Ht 64.0 in | Wt 181.5 lb

## 2019-10-04 DIAGNOSIS — K219 Gastro-esophageal reflux disease without esophagitis: Secondary | ICD-10-CM | POA: Diagnosis not present

## 2019-10-04 DIAGNOSIS — R0989 Other specified symptoms and signs involving the circulatory and respiratory systems: Secondary | ICD-10-CM | POA: Diagnosis not present

## 2019-10-04 MED ORDER — FAMOTIDINE 20 MG PO TABS: 20.0000 mg | ORAL_TABLET | Freq: Every day | ORAL | 0 refills | Status: DC

## 2019-10-04 NOTE — Patient Instructions (Addendum)
-   Take Famotidine 20 mg tablet one by mouth at bedtime. - Notify provider or go to Ed if symptoms worst or fail to improve  - Referral placed for Gastroenterology specialist office will call

## 2019-10-04 NOTE — Progress Notes (Signed)
Provider: Cheney Gosch FNP-C  [name], NP  Patient Care Team: [name], NP as PCP - General (Geriatric Medicine) Warden Fillers, MD as Consulting Physician (Ophthalmology)  Extended Emergency Contact Information Primary Emergency Contact: [facility] Address: [address]          [place], [place] [address] [place] Work Phone: [phone] Mobile Phone: [phone] Relation: Sister Secondary Emergency Contact: Tia Masker Address: [address]          [place], [place] [address] Johnnette Litter of [place] Phone: [phone] Mobile Phone: [phone] Relation: Niece  Code Status:  DNR Goals of care: Advanced Directive information Advanced Directives 10/04/2019  Does Patient Have a Medical Advance Directive? Yes  Type of Paramedic of [place];Out of facility DNR (pink MOST or yellow form)  Does patient want to make changes to medical advance directive? No - Patient declined  Copy of [place] in Chart? Yes - validated most recent copy scanned in chart (See row information)  Would patient like information on creating a medical advance directive? -  Pre-existing out of facility DNR order (yellow form or pink MOST form) -     Chief Complaint  Patient presents with  . Acute Visit    Complaints of Choking Feeling.    HPI:  Pt is a 74 y.o. female seen today for an acute visit for evaluation of choking Feeling in the throat x 10 days.choking feeling is described as intermittent .recalls had increased anxiety/stressed level when symptoms first started 10 days ago due to caring for her loved one.Symptoms tends to occur when seated and none when lying down.Acid reflux under control on Omeprazole 40 mg capsule daily.she denies any difficulties swallowing,hoarseness,frequent clearing of throat or cough.States no issues with sinus drainage.she denies any fever,chills or weight loss.No recent  new medication.    Past Medical History:  Diagnosis Date  . Acid reflux   . Bowel obstruction ([place])   . H/O hiatal hernia   . Hyperlipidemia   . Hypertension    Past Surgical History:  Procedure Laterality Date  . ABDOMINAL HYSTERECTOMY    . APPENDECTOMY    . COLONOSCOPY  03/18/2012   Procedure: COLONOSCOPY;  Surgeon: Rogene Houston, MD;  [facility];  Service: Endoscopy;  Laterality: N/A;  930  . LAPAROTOMY N/A 02/14/2013   Procedure: EXPLORATORY LAPAROTOMY;  Surgeon: Jamesetta So, MD;  Location: [facility];  Service: General;  Laterality: N/A;  . MELANOMA EXCISION  03/2019   Dr.Hall, removed from nack   . 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 Dr. Nevada Crane, [place]     No Known Allergies  Outpatient Encounter Medications as of 10/04/2019  Medication Sig  . amLODipine (NORVASC) 5 MG tablet TAKE 1 TABLET DAILY  . aspirin EC 81 MG tablet Take 81 mg by mouth daily.  Marland Kitchen atorvastatin (LIPITOR) 10 MG tablet TAKE 1 TABLET DAILY  . beta carotene w/minerals (OCUVITE) tablet Take 1 tablet by mouth daily.  . Calcium Carbonate-Vitamin D (CALCIUM 600 + D PO) Take 1 tablet by mouth 2 (two) times daily.  . Cholecalciferol (VITAMIN D3) 250 MCG (10000 UT) capsule Take 10,000 Units by mouth daily.  Marland Kitchen docusate sodium (COLACE) 100 MG capsule Take 100 mg by mouth 2 (two) times daily.  . Fish Oil-Cholecalciferol (FISH OIL + D3) 1000-1000 MG-UNIT CAPS Take 1 capsule by mouth 2 (two) times daily.  Marland Kitchen ibuprofen (ADVIL) 600 MG tablet Take  1 tablet (600 mg total) by mouth every 8 (eight) hours as needed for moderate pain.  . Lactobacillus-Inulin (CULTURELLE DIGESTIVE HEALTH PO) Take 1 capsule by mouth daily.   Marland Kitchen losartan (COZAAR) 50 MG tablet Take 50 mg by mouth daily.  . Misc Natural Products (OSTEO BI-FLEX ADV DOUBLE ST PO) Take 1 tablet by mouth 2 (two) times daily.  . Multiple Vitamin (MULTIVITAMIN WITH MINERALS) TABS Take 1 tablet by mouth daily.  Marland Kitchen omeprazole (PRILOSEC) 40 MG  capsule TAKE 1 CAPSULE DAILY  . polyethylene glycol powder (GLYCOLAX/MIRALAX) powder Take 17 g by mouth daily.  . vitamin B-12 (CYANOCOBALAMIN) 1000 MCG tablet Take 1 tablet (1,000 mcg total) by mouth daily.  Marland Kitchen zinc gluconate 50 MG tablet Take 50 mg by mouth daily.   No facility-administered encounter medications on file as of 10/04/2019.    Review of Systems  Constitutional: Negative for appetite change, chills and fatigue.  HENT: Negative for congestion, postnasal drip, rhinorrhea, sinus pressure, sinus pain, sneezing, sore throat and trouble swallowing.        Choking sensation intermittently in the throat   Respiratory: Negative for cough, chest tightness, shortness of breath and wheezing.   Cardiovascular: Negative for chest pain, palpitations and leg swelling.  Gastrointestinal: Negative for abdominal distention, abdominal pain, constipation, diarrhea, nausea and vomiting.  Skin: Negative for color change, pallor and rash.  Neurological: Negative for dizziness, speech difficulty, weakness, light-headedness and headaches.  Psychiatric/Behavioral: Negative for agitation and sleep disturbance.       Increased stress level taking care of loved one.    Immunization History  Administered Date(s) Administered  . Fluad Quad(high Dose 65+) 02/14/2019  . Influenza, High Dose Seasonal PF 03/26/2017, 03/23/2018  . Influenza,inj,Quad PF,6+ Mos 02/13/2013, 03/26/2015  . Influenza-Unspecified 06/02/2016  . PFIZER SARS-COV-2 Vaccination 06/24/2019, 07/15/2019  . PPD Test 08/10/2018  . Pneumococcal Conjugate-13 03/24/2014  . Pneumococcal Polysaccharide-23 04/23/2018  . Tdap 03/24/2018  . Tetanus 06/02/2001  . Zoster 06/02/2012  . Zoster Recombinat (Shingrix) 03/24/2018, 06/01/2018   Pertinent  Health Maintenance Due  Topic Date Due  . INFLUENZA VACCINE  01/01/2020  . MAMMOGRAM  01/17/2021  . COLONOSCOPY  03/19/2022  . DEXA SCAN  Completed  . PNA vac Low Risk Adult  Completed   Fall  Risk  10/04/2019 09/19/2019 02/14/2019 09/23/2018 04/23/2018  Falls in the past year? 0 1 0 0 0  Number falls in past yr: 0 0 0 0 -  Injury with Fall? 0 1 0 0 -  Comment - [place] ankle - - -  Risk for fall due to : - Impaired balance/gait - - -     Vitals:   10/04/19 1545  BP: 110/80  Pulse: 68  Resp: 16  Temp: 97.8 F (36.6 C)  SpO2: 93%  Weight: 181 lb 8 oz (82.3 kg)  Height: 5\' 4"  (1.626 m)   Body mass index is 31.15 kg/m. Physical Exam Vitals reviewed.  Constitutional:      General: She is not in acute distress.    Appearance: She is obese. She is not ill-appearing.  HENT:     Head: Normocephalic.     Mouth/Throat:     Mouth: Mucous membranes are moist.     Pharynx: Oropharynx is clear. No oropharyngeal exudate or posterior oropharyngeal erythema.  Eyes:     General: No scleral icterus.       Right eye: No discharge.        Left eye: No discharge.  Conjunctiva/sclera: Conjunctivae normal.     Pupils: Pupils are equal, round, and reactive to light.  Neck:     Vascular: No carotid bruit.  Cardiovascular:     Rate and Rhythm: Normal rate and regular rhythm.     Pulses: Normal pulses.     Heart sounds: Normal heart sounds. No murmur. No friction rub. No gallop.   Pulmonary:     Effort: Pulmonary effort is normal. No respiratory distress.     Breath sounds: Normal breath sounds. No wheezing, rhonchi or rales.  Chest:     Chest wall: No tenderness.  Abdominal:     General: Bowel sounds are normal. There is no distension.     Palpations: Abdomen is soft. There is no mass.     Tenderness: There is no abdominal tenderness. There is no right CVA tenderness, left CVA tenderness, guarding or rebound.  Musculoskeletal:     Cervical back: Normal range of motion. No rigidity or tenderness.  Lymphadenopathy:     Cervical: No cervical adenopathy.  Skin:    General: Skin is warm.     Coloration: Skin is not pale.     Findings: No bruising, erythema or rash.  Neurological:       Mental Status: She is alert and oriented to person, place, and time.     Cranial Nerves: No cranial nerve deficit.     Sensory: No sensory deficit.     Motor: No weakness.     Coordination: Coordination normal.     Gait: Gait normal.  Psychiatric:        Mood and Affect: Mood normal.        Behavior: Behavior normal.        Thought Content: Thought content normal.        Judgment: Judgment normal.    Labs reviewed: Recent Labs    03/21/19 0806 08/23/19 0833  NA 143 142  K 4.4 4.1  CL 107 105  CO2 30 28  GLUCOSE 91 89  BUN 13 12  CREATININE 0.60 0.74  CALCIUM 9.2 9.6   Recent Labs    03/21/19 0806 08/23/19 0833  AST 26 26  ALT 30* 28  BILITOT 0.7 0.6  PROT 6.3 6.7   Recent Labs    03/21/19 0806 08/23/19 0833  WBC 7.6 6.1  NEUTROABS 4,119 3,038  HGB 14.3 14.7  HCT 42.2 43.4  MCV 93.4 94.8  PLT 258 271   Lab Results  Component Value Date   TSH 1.87 08/23/2019   Lab Results  Component Value Date   HGBA1C 5.6 03/21/2019   Lab Results  Component Value Date   CHOL 99 08/23/2019   HDL 39 (L) 08/23/2019   LDLCALC 38 08/23/2019   TRIG 135 08/23/2019   CHOLHDL 2.5 08/23/2019    Significant Diagnostic Results in last 30 days:  No results found.  Assessment/Plan 1. Gastroesophageal reflux disease without esophagitis Symptoms under control. - continue on Omeprazole 40 mg capsule daily. - Avoid aggravation foods   - famotidine (PEPCID) 20 MG tablet; Take 1 tablet (20 mg total) by mouth at bedtime.  Dispense: 30 tablet; Refill: 0 - Ambulatory referral to Gastroenterology  2. Choking sensation - Unclear etiology since her Acid reflux is under control on current PPI.symptoms comes when in an upright position.No shortness of breath ,cough,hoarseness or dysphagia. - will add Famotidine 20 mg tablet daily at bedtime - Ambulatory referral to Gastroenterology for further evaluation to rule out other abnormalities.   Family/ staff  Communication: Reviewed  plan of care with patient verbalized understanding.   Labs/tests ordered: None   Next Appointment: As needed if symptoms worsen or fail to improve.   Sandrea Hughs, NP

## 2019-10-12 ENCOUNTER — Encounter: Payer: Self-pay | Admitting: Nurse Practitioner

## 2019-10-12 ENCOUNTER — Other Ambulatory Visit: Payer: Self-pay

## 2019-10-12 ENCOUNTER — Ambulatory Visit (INDEPENDENT_AMBULATORY_CARE_PROVIDER_SITE_OTHER): Payer: Medicare Other | Admitting: Nurse Practitioner

## 2019-10-12 VITALS — BP 124/78 | HR 84 | Temp 98.7°F | Ht 64.0 in | Wt 183.6 lb

## 2019-10-12 DIAGNOSIS — R0989 Other specified symptoms and signs involving the circulatory and respiratory systems: Secondary | ICD-10-CM

## 2019-10-12 DIAGNOSIS — R1310 Dysphagia, unspecified: Secondary | ICD-10-CM | POA: Diagnosis not present

## 2019-10-12 NOTE — Progress Notes (Signed)
Reviewed and agree with management plans. ? ?Marveline Profeta L. Dorris Vangorder, MD, MPH  ?

## 2019-10-12 NOTE — Progress Notes (Addendum)
ASSESSMENT / PLAN:   74 year old female with PMH significant for hyperlipidemia, hypertension, melanoma, SBO, hiatal hernia, GERD, hysterectomy appendectomy  # Choking sensation / ? dysphagia --Constant sensation similar to having someone's hands encircling her throat --Additionally, sometimes after eating she feels like food may be stuck in her esophagus though no actual problems swallowing during the meal.  --Symptoms not typical for primary esophageal problem though but will proceed with further work-up to be sure.  --Start with a barium swallow with tablet.  Depending on results she may EGD.  Will call her with results --In the interim, continue daily PPI.  Okay to continue newly added Pepcid at bedtime for now.  --If barium swallow with tablet is normal consider ENT referral  # Colon cancer screening --Reportedly normal colonoscopy with Dr. Laural Golden in 2013  ADDENDUM: Colonoscopy report received from Dr. 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 10-year follow-up colonoscopy recommended.  -I will put in for a 10-year recall colonoscopy.    HPI:     Chief Complaint: Choking sensation   [name] [name] is a 74 year old female here for evaluation of choking.  She was evaluated by PCP 10/04/2019 for the symptoms.  Per that note she had noted some increased anxiety/stress levels when symptoms first started several days prior.  Her GERD symptoms seem to be under control with daily omeprazole but Pepcid was added at bedtime  Patient describes constant choking sensation like someone's hands are around her neck.  In addition, she has no problem eating but sometimes right afterwards feels like food may be stuck in her esophagus.  She has  not had to make herself vomit or extract any food.  Her symptoms started not too long ago after eating cashews.  She developed abdominal bloating and was concerned about recurrent small bowel obstruction.  That symptom past but then she was left with this choking sensation.  She has a history of GERD but symptoms have been well controlled on daily PPI.  She has minor sinus drainage takes an allergy pill every day.   No other GI symptoms.   Last colonoscopy was in 2013 with Dr. Laural Golden  Data Reviewed:  08/23/19 BMP, CBC unremarkable.   Past Medical History:  Diagnosis Date  . Acid reflux   . Bowel obstruction ([place])   . H/O hiatal hernia   . Hyperlipidemia   . Hypertension      Past Surgical History:  Procedure Laterality Date  . ABDOMINAL HYSTERECTOMY    . APPENDECTOMY    . COLONOSCOPY  03/18/2012   Procedure: COLONOSCOPY;  Surgeon: Rogene Houston, MD;  [facility];  Service: Endoscopy;  Laterality: N/A;  930  . LAPAROTOMY N/A 02/14/2013   Procedure: EXPLORATORY LAPAROTOMY;  Surgeon: Jamesetta So, MD;  Location: [facility];  Service: General;  Laterality: N/A;  . MELANOMA EXCISION  03/2019   Dr.Hall, removed from nack   . 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 Dr. Nevada Crane, Jenny [facility]mann    Family History  Problem Relation Age of Onset  . Cancer - Other Mother        Kidney  . Fibromyalgia Mother   . Arthritis Mother   . Cancer - Lung Father 32  .  Heart disease Brother 104       had heart transplant then died 3 years later after restarting smoking   Social History   Tobacco Use  . Smoking status: Never Smoker  . Smokeless tobacco: Never Used  Substance Use Topics  . Alcohol use: No  . Drug use: No   Current Outpatient Medications  Medication Sig Dispense Refill  . amLODipine (NORVASC) 5 MG tablet TAKE 1 TABLET DAILY 90 tablet 1  . aspirin EC 81 MG tablet Take 81 mg by mouth daily.    Marland Kitchen atorvastatin (LIPITOR) 10 MG tablet TAKE 1 TABLET DAILY 90  tablet 1  . beta carotene w/minerals (OCUVITE) tablet Take 1 tablet by mouth daily.    . Calcium Carbonate-Vitamin D (CALCIUM 600 + D PO) Take 1 tablet by mouth 2 (two) times daily.    . Cholecalciferol (VITAMIN D3) 250 MCG (10000 UT) capsule Take 10,000 Units by mouth daily.    Marland Kitchen docusate sodium (COLACE) 100 MG capsule Take 100 mg by mouth 2 (two) times daily.    . famotidine (PEPCID) 20 MG tablet Take 1 tablet (20 mg total) by mouth at bedtime. 30 tablet 0  . Fish Oil-Cholecalciferol (FISH OIL + D3) 1000-1000 MG-UNIT CAPS Take 1 capsule by mouth 2 (two) times daily.    Marland Kitchen ibuprofen (ADVIL) 600 MG tablet Take 1 tablet (600 mg total) by mouth every 8 (eight) hours as needed for moderate pain. 30 tablet 0  . Lactobacillus-Inulin (CULTURELLE DIGESTIVE HEALTH PO) Take 1 capsule by mouth daily.     Marland Kitchen losartan (COZAAR) 50 MG tablet Take 50 mg by mouth daily.    . Misc Natural Products (OSTEO BI-FLEX ADV DOUBLE ST PO) Take 1 tablet by mouth 2 (two) times daily.    . Multiple Vitamin (MULTIVITAMIN WITH MINERALS) TABS Take 1 tablet by mouth daily.    Marland Kitchen omeprazole (PRILOSEC) 40 MG capsule TAKE 1 CAPSULE DAILY 90 capsule 1  . polyethylene glycol powder (GLYCOLAX/MIRALAX) powder Take 17 g by mouth daily.    . vitamin B-12 (CYANOCOBALAMIN) 1000 MCG tablet Take 1 tablet (1,000 mcg total) by mouth daily. 30 tablet 11  . zinc gluconate 50 MG tablet Take 50 mg by mouth daily.     No current facility-administered medications for this visit.   No Known Allergies   Review of Systems: All systems reviewed and negative except where noted in HPI.   Creatinine clearance cannot be calculated (Patient's most recent lab result is older than the maximum 21 days allowed.)   Physical Exam:    Wt Readings from Last 3 Encounters:  10/04/19 181 lb 8 oz (82.3 kg)  09/19/19 179 lb (81.2 kg)  08/22/19 183 lb (83 kg)    LMP 09/10/2014 (Exact Date)  Constitutional:  Pleasant female in no acute distress. Psychiatric:  Normal mood and affect. Behavior is normal. EENT: Pupils normal.  Conjunctivae are normal. No scleral icterus. Neck supple.  Cardiovascular: Normal rate, regular rhythm. No edema Pulmonary/chest: Effort normal and breath sounds normal. No wheezing, rales or rhonchi. Abdominal: Soft, nondistended, nontender. Bowel sounds active throughout. There are no masses palpable. No hepatomegaly. Neurological: Alert and oriented to person place and time. Skin: Skin is warm and dry. No rashes noted.  Tye Savoy, NP  10/12/2019, 1:26 PM  Cc:  Referring Provider Marlowe Sax, NP

## 2019-10-12 NOTE — Patient Instructions (Signed)
If you are age 73 or older, your body mass index should be between 23-30. Your Body mass index is 31.51 kg/m. If this is out of the aforementioned range listed, please consider follow up with your Primary Care Provider.  If you are age 22 or younger, your body mass index should be between 19-25. Your Body mass index is 31.51 kg/m. If this is out of the aformentioned range listed, please consider follow up with your Primary Care Provider.   You have been scheduled for a Barium Esophogram at [facility] (1st floor of the hospital) on 10/17/19 at 9:30 am. Please arrive 15 minutes prior to your appointment for registration. Make certain not to have anything to eat or drink 3 hours prior to your test. If you need to reschedule for any reason, please contact radiology at [phone] to do so. __________________________________________________________________ A barium swallow is an examination that concentrates on views of the esophagus. This tends to be a double contrast exam (barium and two liquids which, when combined, create a gas to distend the wall of the oesophagus) or single contrast (non-ionic iodine based). The study is usually tailored to your symptoms so a good history is essential. Attention is paid during the study to the form, structure and configuration of the esophagus, looking for functional disorders (such as aspiration, dysphagia, achalasia, motility and reflux) EXAMINATION You may be asked to change into a gown, depending on the type of swallow being performed. A radiologist and radiographer will perform the procedure. The radiologist will advise you of the type of contrast selected for your procedure and direct you during the exam. You will be asked to stand, sit or lie in several different positions and to hold a small amount of fluid in your mouth before being asked to swallow while the imaging is performed .In some instances you may be asked to swallow barium coated marshmallows  to assess the motility of a solid food bolus. The exam can be recorded as a digital or video fluoroscopy procedure. POST PROCEDURE It will take 1-2 days for the barium to pass through your system. To facilitate this, it is important, unless otherwise directed, to increase your fluids for the next 24-48hrs and to resume your normal diet.  This test typically takes about 30 minutes to perform. __________________________________________________________________________________  We will call you with the results of your testing and schedule a follow up at that time.

## 2019-10-17 ENCOUNTER — Ambulatory Visit (HOSPITAL_COMMUNITY)
Admission: RE | Admit: 2019-10-17 | Discharge: 2019-10-17 | Disposition: A | Discharge status: Home / Self Care | Payer: Medicare Other | Source: Ambulatory Visit | Attending: Nurse Practitioner | Admitting: Nurse Practitioner

## 2019-10-17 ENCOUNTER — Other Ambulatory Visit: Payer: Self-pay

## 2019-10-17 DIAGNOSIS — K219 Gastro-esophageal reflux disease without esophagitis: Secondary | ICD-10-CM | POA: Diagnosis not present

## 2019-10-17 DIAGNOSIS — R1310 Dysphagia, unspecified: Secondary | ICD-10-CM

## 2019-10-17 DIAGNOSIS — R0989 Other specified symptoms and signs involving the circulatory and respiratory systems: Secondary | ICD-10-CM | POA: Diagnosis not present

## 2019-11-02 ENCOUNTER — Telehealth: Payer: Self-pay

## 2019-11-02 NOTE — Telephone Encounter (Addendum)
-----   Message from Willia Craze, NP sent at 11/01/2019  2:06 PM EDT ----- Luanna Salk,  Thank you for reviewing the chart and thank you for the additional information regarding procedure candidacy.  This is Kimberly's patient and she agrees with referral to you.  I am including my nurse Beth in this message to please call the patient and set up a consultation with you  ( I spoke to patient over the phone last week and she was interested at that time). Thanks    ----- Message ----- From: Lavena Bullion, DO Sent: 11/01/2019  11:08 AM EDT To: Willia Craze, NP  Nevin Bloodgood,  Thanks for reaching out.  That is a great question.  I looked through her chart, and she would seemingly be an appropriate candidate for TIF based on the positive esophagram and history of GERD.  Based on her description, I agree that her symptoms may be from globus sensation.Some of the common exclusion criteria include:-BMI >35-Eosinophilic esophagitis-Esophageal motility disorder-Esophageal hypersensitivity or functional heartburn-Patients with hiatal hernia <2 cm can be fixed with TIF alone.  Hiatal hernia 2-5 cm can be fixed with concomitant laparoscopic hiatal hernia repair and TIF.  Hiatal hernia >5 centimeters should be fixed surgically only.-Some insurers are more of a barrier than others, but should be universally approved with Medicare ----- Message ----- From: Willia Craze, NP Sent: 10/27/2019   3:34 PM EDT To: Lavena Bullion, DO    This lady has some ongoing globus type symptoms with GERD demonstrated on esophagram as well as a small hiatal hernia.  I just do not know other criteria that would include or exclude her? Thanks

## 2019-11-02 NOTE — Telephone Encounter (Signed)
-----   Message from Willia Craze, NP sent at 11/01/2019  2:14 PM EDT ----- [name] [name], would you please put patient on for a 10-year recolonoscopy September 2023.  Thank you

## 2019-11-02 NOTE — Telephone Encounter (Signed)
Called the patient's mobile number. No answer. Left a message with some information. Appointment tentatively scheduled for 12/08/19 at 9:20 am with Dr Bryan Lemma if the patient wants to go forward with the plan. Asked she call me back to confirm, cancel or reschedule.

## 2019-11-04 NOTE — Telephone Encounter (Signed)
Confirmed the appointment with this very kind patient. She states she has already put the date on her calendar.

## 2019-12-08 ENCOUNTER — Ambulatory Visit: Payer: Medicare Other | Admitting: Gastroenterology

## 2019-12-27 ENCOUNTER — Encounter: Payer: Self-pay | Admitting: Emergency Medicine

## 2019-12-27 ENCOUNTER — Ambulatory Visit
Admission: EM | Admit: 2019-12-27 | Discharge: 2019-12-27 | Disposition: A | Discharge status: Home / Self Care | Payer: Medicare Other | Attending: Family Medicine | Admitting: Family Medicine

## 2019-12-27 ENCOUNTER — Other Ambulatory Visit: Payer: Self-pay

## 2019-12-27 DIAGNOSIS — W19XXXA Unspecified fall, initial encounter: Secondary | ICD-10-CM

## 2019-12-27 DIAGNOSIS — M62838 Other muscle spasm: Secondary | ICD-10-CM

## 2019-12-27 MED ORDER — TIZANIDINE HCL 4 MG PO CAPS
4.0000 mg | ORAL_CAPSULE | Freq: Three times a day (TID) | ORAL | 0 refills | Status: DC
Start: 2019-12-27 — End: 2020-08-10

## 2019-12-27 NOTE — ED Triage Notes (Signed)
Pt fell in the garden last Tuesday after tripping over a plant.  Pt reports pain in her RT scapula with breathing and when trying to lay on her side.

## 2019-12-27 NOTE — Discharge Instructions (Signed)
Take the muscle relaxer up to 3 times a day as needed for muscle relaxation.  Recommend taking at bedtime due to drowsiness. Keep doing heat to the area or hot shower.  Recommend stretching Follow up as needed for continued or worsening symptoms

## 2019-12-27 NOTE — ED Provider Notes (Signed)
[facility]    CSN: [id number] Arrival date & time: 12/27/19  1512      History   Chief Complaint No chief complaint on file.   HPI [name] [name] is a 74 y.o. female.   Patient is a 74 year old female presents today for injury from fall.  Reporting last Tuesday she tripped over plan outside fell on the left breast since that area.  Today she is here for complaints of right upper back, scapular pain.  Worse with deep breathing and certain movements.  Worse at bedtime.  Feels like sharp pain at times and squeezing.  Does not member falling on the side.  She does have large healing bruise to left breast area.  Has taken Tylenol and doing heat for the discomfort which helps some.  No cough, problems, shortness of breath.  ROS per HPI      Past Medical History:  Diagnosis Date  . Acid reflux   . Bowel obstruction ([place])   . H/O hiatal hernia   . Hyperlipidemia   . Hypertension     Patient Active Problem List   Diagnosis Date Noted  . Hx of small bowel obstruction 03/23/2018  . GERD (gastroesophageal reflux disease) 03/24/2015  . Hypokalemia 06/21/2014  . Hyperglycemia 06/20/2014  . Hypertension   . Hyperlipidemia   . Bowel obstruction ([place]) 10/01/2013  . Fatty liver 02/12/2013  . Transaminitis 02/12/2013    Past Surgical History:  Procedure Laterality Date  . ABDOMINAL HYSTERECTOMY    . APPENDECTOMY    . COLONOSCOPY  03/18/2012   Procedure: COLONOSCOPY;  Surgeon: Rogene Houston, MD;  Location: [facility];  Service: Endoscopy;  Laterality: N/A;  930  . LAPAROTOMY N/A 02/14/2013   Procedure: EXPLORATORY LAPAROTOMY;  Surgeon: Jamesetta So, MD;  Location: [facility];  Service: General;  Laterality: N/A;  . MELANOMA EXCISION  03/2019   Dr.Hall, removed from nack   . 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 Dr. Nevada Crane, Jenny Reichmann     OB History   No obstetric history on file.      Home Medications    Prior to Admission  medications   Medication Sig Start Date End Date Taking? Authorizing Provider  amLODipine (NORVASC) 5 MG tablet TAKE 1 TABLET DAILY 06/13/19   [name], NP  aspirin EC 81 MG tablet Take 81 mg by mouth daily.    [provider]  atorvastatin (LIPITOR) 10 MG tablet TAKE 1 TABLET DAILY 06/13/19   [name], NP  beta carotene w/minerals (OCUVITE) tablet Take 1 tablet by mouth daily.    [provider]  Calcium Carbonate-Vitamin D (CALCIUM 600 + D PO) Take 1 tablet by mouth 2 (two) times daily.    [provider]  Cholecalciferol (VITAMIN D3) 250 MCG (10000 UT) capsule Take 10,000 Units by mouth daily.    [provider]  docusate sodium (COLACE) 100 MG capsule Take 100 mg by mouth 2 (two) times daily.    [provider]  famotidine (PEPCID) 20 MG tablet Take 1 tablet (20 mg total) by mouth at bedtime. 10/04/19 11/03/19  Ngetich, Dinah C, NP  Fish Oil-Cholecalciferol (FISH OIL + D3) 1000-1000 MG-UNIT CAPS Take 1 capsule by mouth 2 (two) times daily.    [provider]  ibuprofen (ADVIL) 600 MG tablet Take 1 tablet (600 mg total) by mouth every 8 (eight) hours as needed for moderate pain. 04/15/19   Emerson Monte, FNP  Lactobacillus-Inulin (CULTURELLE DIGESTIVE HEALTH PO) Take 1 capsule by mouth daily.     [provider]  losartan (COZAAR) 50 MG tablet Take 50 mg by mouth daily.    [provider]  Misc Natural Products (OSTEO BI-FLEX ADV DOUBLE ST PO) Take 1 tablet by mouth 2 (two) times daily.    [provider]  Multiple Vitamin (MULTIVITAMIN WITH MINERALS) TABS Take 1 tablet by mouth daily.    [provider]  omeprazole (PRILOSEC) 40 MG capsule TAKE 1 CAPSULE DAILY 06/13/19   [name], NP  polyethylene glycol powder (GLYCOLAX/MIRALAX) powder Take 17 g by mouth daily.    [provider]  tiZANidine (ZANAFLEX) 4 MG capsule Take 1 capsule (4 mg total) by mouth 3 (three) times  daily. 12/27/19   Loura Halt A, NP  vitamin B-12 (CYANOCOBALAMIN) 1000 MCG tablet Take 1 tablet (1,000 mcg total) by mouth daily. 08/24/19   [name], NP  zinc gluconate 50 MG tablet Take 50 mg by mouth daily.    [provider]    Family History Family History  Problem Relation Age of Onset  . Cancer - Other Mother        Kidney  . Fibromyalgia Mother   . Arthritis Mother   . Cancer - Lung Father 23  . Heart disease Brother 65       had heart transplant then died 3 years later after restarting smoking  . Stomach cancer Neg Hx   . Esophageal cancer Neg Hx   . Pancreatic cancer Neg Hx   . Rectal cancer Neg Hx     Social History Social History   Tobacco Use  . Smoking status: Never Smoker  . Smokeless tobacco: Never Used  Vaping Use  . Vaping Use: Never used  Substance Use Topics  . Alcohol use: No  . Drug use: No     Allergies   Patient has no known allergies.   Review of Systems Review of Systems   Physical Exam Triage Vital Signs ED Triage Vitals  Enc Vitals Group     BP 12/27/19 1521 (!) 170/88     Pulse Rate 12/27/19 1521 84     Resp 12/27/19 1521 18     Temp 12/27/19 1521 98.1 F (36.7 C)     Temp Source 12/27/19 1521 Oral     SpO2 12/27/19 1521 94 %     Weight 12/27/19 1520 182 lb (82.6 kg)     Height 12/27/19 1520 5\' 4"  (1.626 m)     Head Circumference --      Peak Flow --      Pain Score 12/27/19 1520 0     Pain Loc --      Pain Edu? --      Excl. in [place]? --    No data found.  Updated Vital Signs BP (!) 170/88 (BP Location: Right Arm)   Pulse 84   Temp 98.1 F (36.7 C) (Oral)   Resp 18   Ht 5\' 4"  (1.626 m)   Wt 182 lb (82.6 kg)   LMP 09/10/2014 (Exact Date)   SpO2 94%   BMI 31.24 kg/m   Visual Acuity Right Eye Distance:   Left Eye Distance:   Bilateral Distance:    Right Eye Near:   Left Eye Near:    Bilateral Near:     Physical Exam Vitals and nursing note reviewed.  Constitutional:      General: She is  not  in acute distress.    Appearance: Normal appearance. She is not ill-appearing, toxic-appearing or diaphoretic.  HENT:     Head: Normocephalic.     Nose: Nose normal.     Mouth/Throat:     Pharynx: Oropharynx is clear.  Eyes:     Conjunctiva/sclera: Conjunctivae normal.  Pulmonary:     Effort: Pulmonary effort is normal.  Chest:       Comments: Healing bruise  Abdominal:     Palpations: Abdomen is soft.     Tenderness: There is no abdominal tenderness.  Musculoskeletal:        General: Normal range of motion.       Arms:     Cervical back: Normal range of motion.     Comments: Tender to deep palpation No spinal tenderness Nontender to palpation of entire shoulder.  Normal range of motion of shoulder.   Skin:    General: Skin is warm and dry.     Findings: No rash.  Neurological:     Mental Status: She is alert.  Psychiatric:        Mood and Affect: Mood normal.      UC Treatments / Results  Labs (all labs ordered are listed, but only abnormal results are displayed) Labs Reviewed - No data to display  EKG   Radiology No results found.  Procedures Procedures (including critical care time)  Medications Ordered in UC Medications - No data to display  Initial Impression / Assessment and Plan / UC Course  I have reviewed the triage vital signs and the nursing notes.  Pertinent labs & imaging results that were available during my care of the patient were reviewed by me and considered in my medical decision making (see chart for details).     Muscle spasm and strain. No concerns on exam or indications for x-ray today. We will have her continue heat, gentle stretching.  Low-dose muscle relaxer to use as needed mostly at bedtime.  Keep taking Tylenol as needed. Follow up as needed for continued or worsening symptoms  Final Clinical Impressions(s) / UC Diagnoses   Final diagnoses:  Muscle spasm  Fall, initial encounter     Discharge Instructions      Take the muscle relaxer up to 3 times a day as needed for muscle relaxation.  Recommend taking at bedtime due to drowsiness. Keep doing heat to the area or hot shower.  Recommend stretching Follow up as needed for continued or worsening symptoms     ED Prescriptions    Medication Sig Dispense Auth. Provider   tiZANidine (ZANAFLEX) 4 MG capsule Take 1 capsule (4 mg total) by mouth 3 (three) times daily. 21 capsule Fleurette Woolbright A, NP     PDMP not reviewed this encounter.   Orvan July, NP 12/27/19 1549

## 2019-12-30 ENCOUNTER — Other Ambulatory Visit: Payer: Self-pay | Admitting: Nurse Practitioner

## 2020-01-04 ENCOUNTER — Other Ambulatory Visit: Payer: Self-pay | Admitting: Nurse Practitioner

## 2020-01-09 ENCOUNTER — Other Ambulatory Visit: Payer: Self-pay | Admitting: Nurse Practitioner

## 2020-01-19 ENCOUNTER — Encounter: Payer: Self-pay | Admitting: Gastroenterology

## 2020-01-19 ENCOUNTER — Ambulatory Visit (INDEPENDENT_AMBULATORY_CARE_PROVIDER_SITE_OTHER): Payer: Medicare Other | Admitting: Gastroenterology

## 2020-01-19 VITALS — BP 126/82 | HR 94 | Ht 64.0 in | Wt 183.5 lb

## 2020-01-19 DIAGNOSIS — K219 Gastro-esophageal reflux disease without esophagitis: Secondary | ICD-10-CM

## 2020-01-19 DIAGNOSIS — R0989 Other specified symptoms and signs involving the circulatory and respiratory systems: Secondary | ICD-10-CM

## 2020-01-19 DIAGNOSIS — K449 Diaphragmatic hernia without obstruction or gangrene: Secondary | ICD-10-CM | POA: Diagnosis not present

## 2020-01-19 NOTE — Patient Instructions (Addendum)
If you are age 72 or older, your body mass index should be between 23-30. Your Body mass index is 31.5 kg/m. If this is out of the aforementioned range listed, please consider follow up with your Primary Care Provider.  If you are age 97 or younger, your body mass index should be between 19-25. Your Body mass index is 31.5 kg/m. If this is out of the aformentioned range listed, please consider follow up with your Primary Care Provider.   Follow up as needed.  It was a pleasure to see you today!  Vito Cirigliano, D.O.

## 2020-01-19 NOTE — Progress Notes (Signed)
P  Chief Complaint:    GERD, hiatal hernia, globus sensation  GI History: [name] [name] is a 74 year old female with a history of HTN, HLD, melanoma, SBO, GERD, hiatal hernia, referred to me by Tye Savoy and Dr. Tarri Glenn for evaluation of possible antireflux intervention with Transoral Incisionless Fundoplication (TIF).  GERD history: -Index symptoms: Heartburn, regurgitation, globus sensation -Current medications: Prilosec 40 mg/day and Pepcid 20 qhs -Complications: Small hiatal hernia  GERD evaluation: -Last EGD: None -Barium esophagram: 10/2019: Normal motility, small HH with moderate gastroesophageal reflux to the level of the aortic arch.  13 mm barium tablet passed without issue. -Esophageal Manometry: None -pH/Impedance: None -Bravo: None  Endoscopic History: -EGD: None -Colonoscopy (Dr. Laural Golden, 03/18/2012): 6-7-7 mm lipoma, LA, internal hemorrhoids, otherwise normal.  Repeat 10 years   GERD-HRQL Questionnaire Score: 6/50 (on PPI and H2 RA)    HPI:     Patient is a 74 y.o. female presenting to the [facility] for further evaluation longstanding history of reflux as above.  Was last seen by Tye Savoy on 10/12/2019.  At that time had been c/o choking sensation, that she reports was worse with increased anxiety/stress. Added Pepcid with complete resolution of the globus sensation. Will flare with times of anxiety, but that is quite rare now.   She was evaluated with barium esophagram on 10/17/2019: Normal motility, small HH with moderate gastroesophageal reflux to the level of the aortic arch.  13 mm barium tablet passed without issue.  Reflux otherwise controlled on current therapy.  No issue tolerating p.o. intake.  Weight stable, no nausea, vomiting, fever, chills.  She does not wish to change therapy at this time nor seek further evaluation for antireflux surgical options.  Review of systems:     No chest pain, no SOB, no fevers, no urinary sx    Past Medical History:  Diagnosis Date  . Acid reflux   . Bowel obstruction ([place])   . H/O hiatal hernia   . Hyperlipidemia   . Hypertension     Patient's surgical history, family medical history, social history, medications and allergies were all reviewed in Epic    Current Outpatient Medications  Medication Sig Dispense Refill  . amLODipine (NORVASC) 5 MG tablet TAKE 1 TABLET DAILY 90 tablet 1  . aspirin EC 81 MG tablet Take 81 mg by mouth daily.    Marland Kitchen atorvastatin (LIPITOR) 10 MG tablet TAKE 1 TABLET DAILY 90 tablet 1  . beta carotene w/minerals (OCUVITE) tablet Take 1 tablet by mouth daily.    . Calcium Carbonate-Vitamin D (CALCIUM 600 + D PO) Take 1 tablet by mouth 2 (two) times daily.    . Cholecalciferol (VITAMIN D3) 250 MCG (10000 UT) capsule Take 10,000 Units by mouth daily.    Marland Kitchen docusate sodium (COLACE) 100 MG capsule Take 100 mg by mouth 2 (two) times daily.    . famotidine (PEPCID) 20 MG tablet Take 1 tablet (20 mg total) by mouth at bedtime. 30 tablet 0  . Fish Oil-Cholecalciferol (FISH OIL + D3) 1000-1000 MG-UNIT CAPS Take 1 capsule by mouth 2 (two) times daily.    Marland Kitchen ibuprofen (ADVIL) 600 MG tablet Take 1 tablet (600 mg total) by mouth every 8 (eight) hours as needed for moderate pain. 30 tablet 0  . Lactobacillus-Inulin (CULTURELLE DIGESTIVE HEALTH PO) Take 1 capsule by mouth daily.     Marland Kitchen losartan (COZAAR) 50 MG tablet Take 50 mg by mouth daily.    . Misc Natural Products (OSTEO BI-FLEX  ADV DOUBLE ST PO) Take 1 tablet by mouth 2 (two) times daily.    . Multiple Vitamin (MULTIVITAMIN WITH MINERALS) TABS Take 1 tablet by mouth daily.    Marland Kitchen omeprazole (PRILOSEC) 40 MG capsule TAKE 1 CAPSULE DAILY 90 capsule 1  . polyethylene glycol powder (GLYCOLAX/MIRALAX) powder Take 17 g by mouth daily.    Marland Kitchen tiZANidine (ZANAFLEX) 4 MG capsule Take 1 capsule (4 mg total) by mouth 3 (three) times daily. 21 capsule 0  . vitamin B-12 (CYANOCOBALAMIN) 1000 MCG tablet Take 1 tablet (1,000 mcg  total) by mouth daily. 30 tablet 11  . zinc gluconate 50 MG tablet Take 50 mg by mouth daily.     No current facility-administered medications for this visit.    Physical Exam:     BP 126/82   Ht 5\' 4"  (1.626 m)   Wt 183 lb 8 oz (83.2 kg)   LMP 09/10/2014 (Exact Date)   BMI 31.50 kg/m   GENERAL:  Pleasant female in NAD PSYCH: : Cooperative, normal affect EENT:  conjunctiva pink, mucous membranes moist, neck supple without masses CARDIAC:  RRR, no murmur heard, no peripheral edema PULM: Normal respiratory effort, lungs CTA bilaterally, no wheezing ABDOMEN:  Nondistended, soft, nontender. No obvious masses, no hepatomegaly,  normal bowel sounds SKIN:  turgor, no lesions seen Musculoskeletal:  Normal muscle tone, normal strength NEURO: Alert and oriented x 3, no focal neurologic deficits   IMPRESSION and PLAN:    1) GERD 2) Hiatal hernia 3) Globus sensation-resolved  74 year old female with history of reflux that was well controlled with Prilosec 40 mg/day with recent development of globus sensation/choking sensation, which resolved with the addition of Pepcid 20 mg at bedtime.  Given good clinical control with medical therapy, she does not wish to proceed with antireflux surgical options/hiatal hernia repair at this time. -Continue current therapy -If symptoms still well controlled, can consider trial off Pepcid in 4-6 months -Has appointment with her PCM next month.  Recommend yearly BMP calcium, magnesium, iron, B12 while on chronic PPI therapy  If she decides to change her mind and wants to be evaluated for hiatal hernia repair/antireflux surgery, can follow-up with me anytime.  Would plan for EGD for preoperative assessment.  Otherwise can follow-up with Tye Savoy and/or Dr. Tarri Glenn as needed.           [place] ,DO, FACG 01/19/2020, 1:40 PM

## 2020-01-31 ENCOUNTER — Other Ambulatory Visit: Payer: Self-pay | Admitting: Nurse Practitioner

## 2020-01-31 DIAGNOSIS — Z0000 Encounter for general adult medical examination without abnormal findings: Secondary | ICD-10-CM

## 2020-02-08 ENCOUNTER — Encounter: Payer: Self-pay | Admitting: Nurse Practitioner

## 2020-02-14 ENCOUNTER — Ambulatory Visit
Admission: RE | Admit: 2020-02-14 | Discharge: 2020-02-14 | Disposition: A | Discharge status: Home / Self Care | Payer: Medicare Other | Source: Ambulatory Visit | Attending: Nurse Practitioner | Admitting: Nurse Practitioner

## 2020-02-14 ENCOUNTER — Other Ambulatory Visit: Payer: Self-pay

## 2020-02-14 DIAGNOSIS — Z0000 Encounter for general adult medical examination without abnormal findings: Secondary | ICD-10-CM

## 2020-02-14 DIAGNOSIS — Z1231 Encounter for screening mammogram for malignant neoplasm of breast: Secondary | ICD-10-CM | POA: Diagnosis not present

## 2020-02-16 ENCOUNTER — Other Ambulatory Visit: Payer: Self-pay

## 2020-02-16 ENCOUNTER — Encounter: Payer: Self-pay | Admitting: Nurse Practitioner

## 2020-02-16 ENCOUNTER — Ambulatory Visit (INDEPENDENT_AMBULATORY_CARE_PROVIDER_SITE_OTHER): Payer: Medicare Other | Admitting: Nurse Practitioner

## 2020-02-16 DIAGNOSIS — Z0000 Encounter for general adult medical examination without abnormal findings: Secondary | ICD-10-CM

## 2020-02-16 NOTE — Progress Notes (Signed)
Subjective:   [name] [name] is a 75 y.o. female who presents for Medicare Annual (Subsequent) preventive examination.  Review of Systems     Cardiac Risk Factors include: obesity (BMI >30kg/m2);advanced age (>73men, >31 women);hypertension;dyslipidemia;family history of premature cardiovascular disease     Objective:    There were no vitals filed for this visit. There is no height or weight on file to calculate BMI.  Advanced Directives 02/16/2020 10/04/2019 09/19/2019 08/22/2019 02/14/2019 09/23/2018 04/23/2018  Does Patient Have a Medical Advance Directive? Yes Yes Yes Yes Yes Yes No  Type of Advance Directive Out of facility DNR (pink MOST or yellow form) [place];Out of facility DNR (pink MOST or yellow form) [place];Living will Living will [place];Living will [place] -  Does patient want to make changes to medical advance directive? No - Patient declined No - Patient declined No - Patient declined No - Patient declined No - Patient declined No - Patient declined -  Copy of [place] in Chart? - Yes - validated most recent copy scanned in chart (See row information) Yes - validated most recent copy scanned in chart (See row information) - Yes - validated most recent copy scanned in chart (See row information) Yes - validated most recent copy scanned in chart (See row information) -  Would patient like information on creating a medical advance directive? - - - - - - -  Pre-existing out of facility DNR order (yellow form or pink MOST form) - - - - - - -    Current Medications (verified) Outpatient Encounter Medications as of 02/16/2020  Medication Sig   amLODipine (NORVASC) 5 MG tablet TAKE 1 TABLET DAILY   aspirin EC 81 MG tablet Take 81 mg by mouth daily.   atorvastatin (LIPITOR) 10 MG tablet TAKE 1 TABLET DAILY   beta carotene w/minerals (OCUVITE) tablet Take 1 tablet by  mouth daily.   Calcium Carbonate-Vitamin D (CALCIUM 600 + D PO) Take 1 tablet by mouth 2 (two) times daily.   Cholecalciferol (VITAMIN D3) 250 MCG (10000 UT) capsule Take 10,000 Units by mouth daily.   docusate sodium (COLACE) 100 MG capsule Take 100 mg by mouth 2 (two) times daily.   Fish Oil-Cholecalciferol (FISH OIL + D3) 1000-1000 MG-UNIT CAPS Take 1 capsule by mouth 2 (two) times daily.   ibuprofen (ADVIL) 600 MG tablet Take 1 tablet (600 mg total) by mouth every 8 (eight) hours as needed for moderate pain.   Lactobacillus-Inulin (CULTURELLE DIGESTIVE HEALTH PO) Take 1 capsule by mouth daily.    losartan (COZAAR) 50 MG tablet Take 50 mg by mouth daily.   Misc Natural Products (OSTEO BI-FLEX ADV DOUBLE ST PO) Take 1 tablet by mouth 2 (two) times daily.   Multiple Vitamin (MULTIVITAMIN WITH MINERALS) TABS Take 1 tablet by mouth daily.   omeprazole (PRILOSEC) 40 MG capsule TAKE 1 CAPSULE DAILY   polyethylene glycol powder (GLYCOLAX/MIRALAX) powder Take 17 g by mouth daily.   tiZANidine (ZANAFLEX) 4 MG capsule Take 1 capsule (4 mg total) by mouth 3 (three) times daily.   vitamin B-12 (CYANOCOBALAMIN) 1000 MCG tablet Take 1 tablet (1,000 mcg total) by mouth daily.   zinc gluconate 50 MG tablet Take 50 mg by mouth daily.   famotidine (PEPCID) 20 MG tablet Take 1 tablet (20 mg total) by mouth at bedtime.   No facility-administered encounter medications on file as of 02/16/2020.    Allergies (verified)  Patient has no known allergies.   History: Past Medical History:  Diagnosis Date   Acid reflux    Bowel obstruction (HCC)    H/O hiatal hernia    Hyperlipidemia    Hypertension    Past Surgical History:  Procedure Laterality Date   ABDOMINAL HYSTERECTOMY     APPENDECTOMY     BREAST CYST EXCISION Right    [phone]? 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 Dr. [place] Crane, Jenny Reichmann    Family History  Problem Relation Age of Onset   Cancer - Other Mother        Kidney   Fibromyalgia Mother    Arthritis Mother    Cancer - Lung Father 28   Heart disease Brother 96       had heart transplant then died 3 years later after restarting smoking   Stomach cancer Neg Hx    Esophageal cancer Neg Hx    Pancreatic cancer Neg Hx    Rectal cancer Neg Hx    Social History   Socioeconomic History   Marital status: Single    Spouse name: Not on file   Number of children: Not on file   Years of education: Not on file   Highest education level: Not on file  Occupational History   Not on file  Tobacco Use   Smoking status: Never Smoker   Smokeless tobacco: Never Used  Vaping Use   Vaping Use: Never used  Substance and Sexual Activity   Alcohol use: Yes    Comment: occassionally   Drug use: No   Sexual activity: Not Currently  Other Topics Concern   Not on file  Social History Narrative   Social History      Diet?       Do you drink/eat things with caffeine? yes      Marital status?            single                        What year were you married?      Do you live in a house, apartment, assisted living, condo, trailer, etc.? house      Is it one or more stories? Yes but seldom go upstairs      How many persons live in your home? 1       Do you have any pets in your home? (please list) no       Highest level of education completed? 12- business college      Current or past profession: retired      Do you exercise?                  yes                    Type & how often? 5 days a week- 1 hour per day      Advanced Directives       Do you have a living will? yes  Do you have a DNR form?                                  If not, do you want to discuss one? yes      Do you have signed POA/HPOA for forms?  yes      Functional Status      Do you have difficulty bathing or dressing yourself? no      Do you have difficulty preparing food or eating? no      Do you have difficulty managing your medications? no      Do you have difficulty managing your finances?  no      Do you have difficulty affording your medications?  no      Social Determinants of Health   Financial Resource Strain:    Difficulty of Paying Living Expenses: Not on file  Food Insecurity:    Worried About Running Out of Food in the Last Year: Not on file   Ran Out of Food in the Last Year: Not on file  Transportation Needs:    Lack of Transportation (Medical): Not on file   Lack of Transportation (Non-Medical): Not on file  Physical Activity:    Days of Exercise per Week: Not on file   Minutes of Exercise per Session: Not on file  Stress:    Feeling of Stress : Not on file  Social Connections:    Frequency of Communication with Friends and Family: Not on file   Frequency of Social Gatherings with Friends and Family: Not on file   Attends Religious Services: Not on file   Active Member of Clubs or Organizations: Not on file   Attends Archivist Meetings: Not on file   Marital Status: Not on file    Tobacco Counseling Counseling given: Not Answered   Clinical Intake:  Pre-visit preparation completed: Yes  Pain : No/denies pain     BMI - recorded: 31 Nutritional Status: BMI > 30  Obese Diabetes: No  How often do you need to have someone help you when you read instructions, pamphlets, or other written materials from your doctor or pharmacy?: 1 - Never  Diabetic?no         Activities of Daily Living In your present state of health, do you have any difficulty performing the following  activities: 02/16/2020  Hearing? N  Vision? Y  Difficulty concentrating or making decisions? N  Walking or climbing stairs? N  Dressing or bathing? N  Doing errands, shopping? N  Preparing Food and eating ? N  Using the Toilet? N  In the past six months, have you accidently leaked urine? N  Do you have problems with loss of bowel control? N  Managing your Medications? N  Managing your Finances? N  Housekeeping or managing your Housekeeping? N  Some recent data might be hidden    Patient Care Team: [name], NP as PCP - General (Geriatric Medicine) Warden Fillers, MD as Consulting Physician (Ophthalmology)  Indicate any recent Medical Services you may have received from other than [facility] providers in the past year (date may be approximate).     Assessment:   This is a routine wellness examination for Samiya.  Hearing/Vision screen  Hearing Screening   125Hz  250Hz  500Hz  1000Hz  2000Hz  3000Hz  4000Hz  6000Hz  8000Hz   Right ear:           Left ear:  Comments: Patient has no hearing problems.  Vision Screening Comments: Patient has no vision problems and is scheduled for eye exam next month  Dietary issues and exercise activities discussed: Current Exercise Habits: Home exercise routine, Type of exercise: calisthenics;walking, Time (Minutes): 30, Frequency (Times/Week): 5, Weekly Exercise (Minutes/Week): 150  Goals     Weight (lb) < 175 lb (79.4 kg)     Weight loss with healthy eating and exercise      Depression Screen PHQ 2/9 Scores 02/16/2020 02/14/2019 04/23/2018 03/23/2018  PHQ - 2 Score 0 0 0 0    Fall Risk Fall Risk  02/16/2020 10/04/2019 09/19/2019 02/14/2019 09/23/2018  Falls in the past year? 1 0 1 0 0  Number falls in past yr: 1 0 0 0 0  Injury with Fall? 0 0 1 0 0  Comment - - [place] ankle - -  Risk for fall due to : - - Impaired balance/gait - -    Any stairs in or around the home? Yes  If so, are there any without handrails? No  Home free of  loose throw rugs in walkways, pet beds, electrical cords, etc? Yes  Adequate lighting in your home to reduce risk of falls? Yes   ASSISTIVE DEVICES UTILIZED TO PREVENT FALLS:  Life alert? Yes  Use of a cane, walker or w/c? No  Grab bars in the bathroom? No  Shower chair or bench in shower? No  Elevated toilet seat or a handicapped toilet? No   TIMED UP AND GO:  na  Cognitive Function: MMSE - Mini Mental State Exam 02/14/2019  Orientation to time 4  Orientation to Place 5  Registration 3  Attention/ Calculation 5  Recall 3  Language- name 2 objects 2  Language- repeat 1  Language- follow 3 step command 3  Language- read & follow direction 1  Write a sentence 1  Copy design 1  Total score 29     6CIT Screen 02/16/2020  What Year? 0 points  What month? 0 points  What time? 0 points  Count back from 20 0 points  Months in reverse 0 points  Repeat phrase 2 points  Total Score 2    Immunizations Immunization History  Administered Date(s) Administered   Fluad Quad(high Dose 65+) 02/14/2019   Influenza, High Dose Seasonal PF 03/26/2017, 03/23/2018   Influenza,inj,Quad PF,6+ Mos 02/13/2013, 03/26/2015   Influenza-Unspecified 06/02/2016   PFIZER SARS-COV-2 Vaccination 06/24/2019, 07/15/2019   PPD Test 08/10/2018   Pneumococcal Conjugate-13 03/24/2014   Pneumococcal Polysaccharide-23 04/23/2018   Tdap 03/24/2018   Tetanus 06/02/2001   Zoster 06/02/2012   Zoster Recombinat (Shingrix) 03/24/2018, 06/01/2018    TDAP status: Up to date Flu vaccine due at this time Pneumococcal vaccine status: Up to date Covid-19 vaccine status: Completed vaccines  Qualifies for Shingles Vaccine? Yes   Zostavax completed No   Shingrix Completed?: Yes  Screening Tests Health Maintenance  Topic Date Due   INFLUENZA VACCINE  01/01/2020   MAMMOGRAM  02/13/2022   COLONOSCOPY  03/19/2022   TETANUS/TDAP  03/24/2028   DEXA SCAN  Completed   COVID-19 Vaccine   Completed   Hepatitis C Screening  Completed   PNA vac Low Risk Adult  Completed    Health Maintenance  Health Maintenance Due  Topic Date Due   INFLUENZA VACCINE  01/01/2020    Colorectal cancer screening: Completed 2013. Repeat every 10  years Mammogram status: Completed 02/14/2020. Repeat every year Bone Density status: Completed 2019. Results reflect: Bone density  results: NORMAL. Repeat every 2 years.  Lung Cancer Screening: (Low Dose CT Chest recommended if Age 27-80 years, 30 pack-year currently smoking OR have quit w/in 15years.) does not qualify.   Lung Cancer Screening Referral: na  Additional Screening:  Hepatitis C Screening: does not qualify; Completed 2021   Vision Screening: Recommended annual ophthalmology exams for early detection of glaucoma and other disorders of the eye. Is the patient up to date with their annual eye exam?  No  scheduled in 3 weeks Who is the provider or what is the name of the office in which the patient attends annual eye exams? Dr Katy Fitch If pt is not established with a provider, would they like to be referred to a provider to establish care? No .   Dental Screening: Recommended annual dental exams for proper oral hygiene  Community Resource Referral / Chronic Care Management: CRR required this visit?  No   CCM required this visit?  No      Plan:     I have personally reviewed and noted the following in the patients chart:    Medical and social history  Use of alcohol, tobacco or illicit drugs   Current medications and supplements  Functional ability and status  Nutritional status  Physical activity  Advanced directives  List of other physicians  Hospitalizations, surgeries, and ER visits in previous 12 months  Vitals  Screenings to include cognitive, depression, and falls  Referrals and appointments  In addition, I have reviewed and discussed with patient certain preventive protocols, quality metrics, and  best practice recommendations. A written personalized care plan for preventive services as well as general preventive health recommendations were provided to patient.     [name], NP   02/16/2020    Virtual Visit via Telephone Note  I connected with@ on 02/16/20 at  9:00 AM EDT by telephone and verified that I am speaking with the correct person using two identifiers.  Location: Patient: home Provider: [facility]   I discussed the limitations, risks, security and privacy concerns of performing an evaluation and management service by telephone and the availability of in person appointments. I also discussed with the patient that there may be a patient responsible charge related to this service. The patient expressed understanding and agreed to proceed.   I discussed the assessment and treatment plan with the patient. The patient was provided an opportunity to ask questions and all were answered. The patient agreed with the plan and demonstrated an understanding of the instructions.   The patient was advised to call back or seek an in-person evaluation if the symptoms worsen or if the condition fails to improve as anticipated.  I provided 18 minutes of non-face-to-face time during this encounter.  Carlos American. Harle Battiest Avs printed and mailed

## 2020-02-16 NOTE — Progress Notes (Signed)
This service is provided via telemedicine  No vital signs collected/recorded due to the encounter was a telemedicine visit.   Location of patient (ex: home, work):  Home  Patient consents to a telephone visit:  Yes, see encounter dated 09/19/2019  Location of the provider (ex: office, home):  [place]  Name of any referring provider:  N/A  Names of all persons participating in the telemedicine service and their role in the encounter:  Sherrie Mustache, Nurse Practitioner, Carroll Kinds, CMA, and patient.   Time spent on call:  8 minutes with medical assistant.

## 2020-02-16 NOTE — Patient Instructions (Signed)
[name] , Thank you for taking time to come for your Medicare Wellness Visit. I appreciate your ongoing commitment to your health goals. Please review the following plan we discussed and let me know if I can assist you in the future.   Screening recommendations/referrals: Colonoscopy up to date Mammogram up to date Bone Density Due this year after Nov 11 Recommended yearly ophthalmology/optometry visit for glaucoma screening and checkup Recommended yearly dental visit for hygiene and checkup  Vaccinations: Influenza vaccine RECOMMENDED at next OV Pneumococcal vaccine up to date Tdap vaccine up to date Shingles vaccine up to date    Advanced directives: to bring healthcare POA and living to office so we can place on file.   Conditions/risks identified: advanced age, obesity, family hx of premature cardiovascular disease, hypertension, hyperlipidemia  Next appointment: 1 year   Preventive Care 43 Years and Older, Female Preventive care refers to lifestyle choices and visits with your health care provider that can promote health and wellness. What does preventive care include?  A yearly physical exam. This is also called an annual well check.  Dental exams once or twice a year.  Routine eye exams. Ask your health care provider how often you should have your eyes checked.  Personal lifestyle choices, including:  Daily care of your teeth and gums.  Regular physical activity.  Eating a healthy diet.  Avoiding tobacco and drug use.  Limiting alcohol use.  Practicing safe sex.  Taking low-dose aspirin every day.  Taking vitamin and mineral supplements as recommended by your health care provider. What happens during an annual well check? The services and screenings done by your health care provider during your annual well check will depend on your age, overall health, lifestyle risk factors, and family history of disease. Counseling  Your health care provider may  ask you questions about your:  Alcohol use.  Tobacco use.  Drug use.  Emotional well-being.  Home and relationship well-being.  Sexual activity.  Eating habits.  History of falls.  Memory and ability to understand (cognition).  Work and work Statistician.  Reproductive health. Screening  You may have the following tests or measurements:  Height, weight, and BMI.  Blood pressure.  Lipid and cholesterol levels. These may be checked every 5 years, or more frequently if you are over 10 years old.  Skin check.  Lung cancer screening. You may have this screening every year starting at age 19 if you have a 30-pack-year history of smoking and currently smoke or have quit within the past 15 years.  Fecal occult blood test (FOBT) of the stool. You may have this test every year starting at age 22.  Flexible sigmoidoscopy or colonoscopy. You may have a sigmoidoscopy every 5 years or a colonoscopy every 10 years starting at age 11.  Hepatitis C blood test.  Hepatitis B blood test.  Sexually transmitted disease (STD) testing.  Diabetes screening. This is done by checking your blood sugar (glucose) after you have not eaten for a while (fasting). You may have this done every 1-3 years.  Bone density scan. This is done to screen for osteoporosis. You may have this done starting at age 58.  Mammogram. This may be done every 1-2 years. Talk to your health care provider about how often you should have regular mammograms. Talk with your health care provider about your test results, treatment options, and if necessary, the need for more tests. Vaccines  Your health care provider may recommend certain vaccines, such as:  Influenza vaccine. This is recommended every year.  Tetanus, diphtheria, and acellular pertussis (Tdap, Td) vaccine. You may need a Td booster every 10 years.  Zoster vaccine. You may need this after age 53.  Pneumococcal 13-valent conjugate (PCV13) vaccine. One  dose is recommended after age 15.  Pneumococcal polysaccharide (PPSV23) vaccine. One dose is recommended after age 21. Talk to your health care provider about which screenings and vaccines you need and how often you need them. This information is not intended to replace advice given to you by your health care provider. Make sure you discuss any questions you have with your health care provider. Document Released: 06/15/2015 Document Revised: 02/06/2016 Document Reviewed: 03/20/2015 Elsevier Interactive Patient Education  2017 [employer] Prevention in the Home Falls can cause injuries. They can happen to people of all ages. There are many things you can do to make your home safe and to help prevent falls. What can I do on the outside of my home?  Regularly fix the edges of walkways and driveways and fix any cracks.  Remove anything that might make you trip as you walk through a door, such as a raised step or threshold.  Trim any bushes or trees on the path to your home.  Use bright outdoor lighting.  Clear any walking paths of anything that might make someone trip, such as rocks or tools.  Regularly check to see if handrails are loose or broken. Make sure that both sides of any steps have handrails.  Any raised decks and porches should have guardrails on the edges.  Have any leaves, snow, or ice cleared regularly.  Use sand or salt on walking paths during winter.  Clean up any spills in your garage right away. This includes oil or grease spills. What can I do in the bathroom?  Use night lights.  Install grab bars by the toilet and in the tub and shower. Do not use towel bars as grab bars.  Use non-skid mats or decals in the tub or shower.  If you need to sit down in the shower, use a plastic, non-slip stool.  Keep the floor dry. Clean up any water that spills on the floor as soon as it happens.  Remove soap buildup in the tub or shower regularly.  Attach bath  mats securely with double-sided non-slip rug tape.  Do not have throw rugs and other things on the floor that can make you trip. What can I do in the bedroom?  Use night lights.  Make sure that you have a light by your bed that is easy to reach.  Do not use any sheets or blankets that are too big for your bed. They should not hang down onto the floor.  Have a firm chair that has side arms. You can use this for support while you get dressed.  Do not have throw rugs and other things on the floor that can make you trip. What can I do in the kitchen?  Clean up any spills right away.  Avoid walking on wet floors.  Keep items that you use a lot in easy-to-reach places.  If you need to reach something above you, use a strong step stool that has a grab bar.  Keep electrical cords out of the way.  Do not use floor polish or wax that makes floors slippery. If you must use wax, use non-skid floor wax.  Do not have throw rugs and other things on the floor that  can make you trip. What can I do with my stairs?  Do not leave any items on the stairs.  Make sure that there are handrails on both sides of the stairs and use them. Fix handrails that are broken or loose. Make sure that handrails are as long as the stairways.  Check any carpeting to make sure that it is firmly attached to the stairs. Fix any carpet that is loose or worn.  Avoid having throw rugs at the top or bottom of the stairs. If you do have throw rugs, attach them to the floor with carpet tape.  Make sure that you have a light switch at the top of the stairs and the bottom of the stairs. If you do not have them, ask someone to add them for you. What else can I do to help prevent falls?  Wear shoes that:  Do not have high heels.  Have rubber bottoms.  Are comfortable and fit you well.  Are closed at the toe. Do not wear sandals.  If you use a stepladder:  Make sure that it is fully opened. Do not climb a closed  stepladder.  Make sure that both sides of the stepladder are locked into place.  Ask someone to hold it for you, if possible.  Clearly mark and make sure that you can see:  Any grab bars or handrails.  First and last steps.  Where the edge of each step is.  Use tools that help you move around (mobility aids) if they are needed. These include:  Canes.  Walkers.  Scooters.  Crutches.  Turn on the lights when you go into a dark area. Replace any light bulbs as soon as they burn out.  Set up your furniture so you have a clear path. Avoid moving your furniture around.  If any of your floors are uneven, fix them.  If there are any pets around you, be aware of where they are.  Review your medicines with your doctor. Some medicines can make you feel dizzy. This can increase your chance of falling. Ask your doctor what other things that you can do to help prevent falls. This information is not intended to replace advice given to you by your health care provider. Make sure you discuss any questions you have with your health care provider. Document Released: 03/15/2009 Document Revised: 10/25/2015 Document Reviewed: 06/23/2014 Elsevier Interactive Patient Education  2017 [employer].

## 2020-02-22 ENCOUNTER — Ambulatory Visit: Payer: Medicare Other | Admitting: Nurse Practitioner

## 2020-02-24 ENCOUNTER — Encounter: Payer: Self-pay | Admitting: Nurse Practitioner

## 2020-02-27 DIAGNOSIS — Z23 Encounter for immunization: Secondary | ICD-10-CM | POA: Diagnosis not present

## 2020-02-29 ENCOUNTER — Encounter: Payer: Self-pay | Admitting: Nurse Practitioner

## 2020-03-09 ENCOUNTER — Ambulatory Visit: Payer: Medicare Other | Admitting: Nurse Practitioner

## 2020-03-14 DIAGNOSIS — Z961 Presence of intraocular lens: Secondary | ICD-10-CM | POA: Diagnosis not present

## 2020-03-14 DIAGNOSIS — Z9889 Other specified postprocedural states: Secondary | ICD-10-CM | POA: Diagnosis not present

## 2020-03-14 DIAGNOSIS — H04123 Dry eye syndrome of bilateral lacrimal glands: Secondary | ICD-10-CM | POA: Diagnosis not present

## 2020-03-14 DIAGNOSIS — H31092 Other chorioretinal scars, left eye: Secondary | ICD-10-CM | POA: Diagnosis not present

## 2020-03-14 DIAGNOSIS — H43811 Vitreous degeneration, right eye: Secondary | ICD-10-CM | POA: Diagnosis not present

## 2020-03-18 ENCOUNTER — Encounter: Payer: Self-pay | Admitting: Nurse Practitioner

## 2020-03-19 ENCOUNTER — Ambulatory Visit: Payer: Medicare Other | Admitting: Nurse Practitioner

## 2020-03-22 DIAGNOSIS — D225 Melanocytic nevi of trunk: Secondary | ICD-10-CM | POA: Diagnosis not present

## 2020-03-22 DIAGNOSIS — Z08 Encounter for follow-up examination after completed treatment for malignant neoplasm: Secondary | ICD-10-CM | POA: Diagnosis not present

## 2020-03-22 DIAGNOSIS — Z1283 Encounter for screening for malignant neoplasm of skin: Secondary | ICD-10-CM | POA: Diagnosis not present

## 2020-03-22 DIAGNOSIS — Z85820 Personal history of malignant melanoma of skin: Secondary | ICD-10-CM | POA: Diagnosis not present

## 2020-04-03 ENCOUNTER — Encounter: Payer: Self-pay | Admitting: Nurse Practitioner

## 2020-04-03 DIAGNOSIS — Z23 Encounter for immunization: Secondary | ICD-10-CM | POA: Diagnosis not present

## 2020-04-11 ENCOUNTER — Ambulatory Visit (INDEPENDENT_AMBULATORY_CARE_PROVIDER_SITE_OTHER): Payer: Medicare Other | Admitting: Nurse Practitioner

## 2020-04-11 ENCOUNTER — Other Ambulatory Visit: Payer: Self-pay

## 2020-04-11 ENCOUNTER — Encounter: Payer: Self-pay | Admitting: Nurse Practitioner

## 2020-04-11 VITALS — BP 140/82 | HR 57 | Temp 97.8°F | Ht 64.0 in | Wt 183.4 lb

## 2020-04-11 DIAGNOSIS — M25561 Pain in right knee: Secondary | ICD-10-CM | POA: Diagnosis not present

## 2020-04-11 DIAGNOSIS — E2839 Other primary ovarian failure: Secondary | ICD-10-CM | POA: Diagnosis not present

## 2020-04-11 DIAGNOSIS — R0989 Other specified symptoms and signs involving the circulatory and respiratory systems: Secondary | ICD-10-CM

## 2020-04-11 DIAGNOSIS — K219 Gastro-esophageal reflux disease without esophagitis: Secondary | ICD-10-CM | POA: Diagnosis not present

## 2020-04-11 DIAGNOSIS — E785 Hyperlipidemia, unspecified: Secondary | ICD-10-CM | POA: Diagnosis not present

## 2020-04-11 DIAGNOSIS — I10 Essential (primary) hypertension: Secondary | ICD-10-CM | POA: Diagnosis not present

## 2020-04-11 DIAGNOSIS — N644 Mastodynia: Secondary | ICD-10-CM

## 2020-04-11 NOTE — Progress Notes (Signed)
Careteam: Patient Care Team: [name], NP as PCP - General (Geriatric Medicine) Warden Fillers, MD as Consulting Physician (Ophthalmology)  PLACE OF SERVICE:  [place] Directive information Does Patient Have a Medical Advance Directive?: Yes, Type of Advance Directive: Living will;Out of facility DNR (pink MOST or yellow form), Does patient want to make changes to medical advance directive?: No - Patient declined  No Known Allergies  Chief Complaint  Patient presents with  . Medical Management of Chronic Issues    6 month follow-up. Examine left breast, patient with a raised area of concenr (noticed on Sunday), area feels itchy at time. Examine right knee, pateint feels like she has a cramp in right buttock and feels its all related. Patient questions if she is due for a bone density.      HPI: Patient is a 74 y.o. female for routine follow up.   Area to left breast that is sore, started itching after she noticed it.   Right knee- has gotten worse over the last month. Better since she has been taking aleve over the last 4 days. No swelling or redness noted.  Using an ace banadage.   Feels like she has a cramp in her right buttock at night, puts deep blue which helps the pain.   Has had choking cessation but has found that it is related and had worsening symptoms when consuming peanuts - she is now avoiding this.   GERD- controlled at this time.   Osteopenia- continues on cal and vit d   Review of Systems:  Review of Systems  Constitutional: Negative for chills, fever and weight loss.  HENT: Negative for tinnitus.   Respiratory: Negative for cough, sputum production and shortness of breath.   Cardiovascular: Negative for chest pain, palpitations and leg swelling.  Gastrointestinal: Negative for abdominal pain, constipation, diarrhea and heartburn.  Genitourinary: Negative for dysuria, frequency and urgency.  Musculoskeletal: Positive for joint  pain and myalgias. Negative for back pain and falls.  Skin: Negative.   Neurological: Negative for dizziness and headaches.  Psychiatric/Behavioral: Negative for depression and memory loss. The patient does not have insomnia.     Past Medical History:  Diagnosis Date  . Acid reflux   . Bowel obstruction ([place])   . H/O hiatal hernia   . Hyperlipidemia   . Hypertension    Past Surgical History:  Procedure Laterality Date  . ABDOMINAL HYSTERECTOMY    . APPENDECTOMY    . BREAST CYST EXCISION Right    [phone]? benign  . COLONOSCOPY  03/18/2012   Procedure: COLONOSCOPY;  Surgeon: Rogene Houston, MD;  [facility];  Service: Endoscopy;  Laterality: N/A;  930  . LAPAROTOMY N/A 02/14/2013   Procedure: EXPLORATORY LAPAROTOMY;  Surgeon: Jamesetta So, MD;  Location: [facility];  Service: General;  Laterality: N/A;  . MELANOMA EXCISION  03/2019   Dr.Hall, removed from nack   . 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 Dr. Nevada Crane, Jenny Reichmann    Social History:   reports that she has never smoked. She has never used smokeless tobacco. She reports current alcohol use. She reports that she does not use drugs.  Family History  Problem Relation Age of Onset  . Cancer - Other Mother        Kidney  . Fibromyalgia Mother   . Arthritis Mother   . Cancer - Lung Father 30  . Heart disease Brother 60  had heart transplant then died 3 years later after restarting smoking  . Lung disease Paternal Aunt        Lung Mass  . COPD Paternal Uncle   . Leukemia Paternal Uncle   . Stomach cancer Neg Hx   . Esophageal cancer Neg Hx   . Pancreatic cancer Neg Hx   . Rectal cancer Neg Hx     Medications: Patient's Medications  New Prescriptions   No medications on file  Previous Medications   AMLODIPINE (NORVASC) 5 MG TABLET    TAKE 1 TABLET DAILY   ASPIRIN EC 81 MG TABLET    Take 81 mg by mouth daily.   ATORVASTATIN (LIPITOR) 10 MG TABLET    TAKE 1 TABLET DAILY   BETA  CAROTENE W/MINERALS (OCUVITE) TABLET    Take 1 tablet by mouth daily.   CALCIUM CARBONATE-VITAMIN D (CALCIUM 600 + D PO)    Take 1 tablet by mouth 2 (two) times daily.   CHOLECALCIFEROL (VITAMIN D3) 250 MCG (10000 UT) CAPSULE    Take 10,000 Units by mouth daily.   DOCUSATE SODIUM (COLACE) 100 MG CAPSULE    Take 100 mg by mouth 2 (two) times daily.   FAMOTIDINE (PEPCID) 20 MG TABLET    Take 1 tablet (20 mg total) by mouth at bedtime.   FISH OIL-CHOLECALCIFEROL (FISH OIL + D3) 1000-1000 MG-UNIT CAPS    Take 1 capsule by mouth 2 (two) times daily.   IBUPROFEN (ADVIL) 600 MG TABLET    Take 1 tablet (600 mg total) by mouth every 8 (eight) hours as needed for moderate pain.   LACTOBACILLUS-INULIN (CULTURELLE DIGESTIVE HEALTH PO)    Take 1 capsule by mouth daily.    LOSARTAN (COZAAR) 50 MG TABLET    Take 50 mg by mouth daily.   MISC NATURAL PRODUCTS (OSTEO BI-FLEX ADV DOUBLE ST PO)    Take 1 tablet by mouth 2 (two) times daily.   MULTIPLE VITAMIN (MULTIVITAMIN WITH MINERALS) TABS    Take 1 tablet by mouth daily.   OMEPRAZOLE (PRILOSEC) 40 MG CAPSULE    TAKE 1 CAPSULE DAILY   POLYETHYLENE GLYCOL POWDER (GLYCOLAX/MIRALAX) POWDER    Take 17 g by mouth daily.   TIZANIDINE (ZANAFLEX) 4 MG CAPSULE    Take 1 capsule (4 mg total) by mouth 3 (three) times daily.   VITAMIN B-12 (CYANOCOBALAMIN) 1000 MCG TABLET    Take 1 tablet (1,000 mcg total) by mouth daily.   ZINC GLUCONATE 50 MG TABLET    Take 50 mg by mouth daily.  Modified Medications   No medications on file  Discontinued Medications   No medications on file    Physical Exam:  Vitals:   04/11/20 1514  BP: 140/82  Pulse: (!) 57  Temp: 97.8 F (36.6 C)  TempSrc: Temporal  SpO2: 95%  Weight: 183 lb 6.4 oz (83.2 kg)  Height: '5\' 4"'  (1.626 m)   Body mass index is 31.48 kg/m. Wt Readings from Last 3 Encounters:  04/11/20 183 lb 6.4 oz (83.2 kg)  01/19/20 183 lb 8 oz (83.2 kg)  12/27/19 182 lb (82.6 kg)    Physical Exam Constitutional:       General: She is not in acute distress.    Appearance: She is well-developed. She is not diaphoretic.  HENT:     Head: Normocephalic and atraumatic.     Mouth/Throat:     Pharynx: No oropharyngeal exudate.  Eyes:     Conjunctiva/sclera: Conjunctivae normal.  Pupils: Pupils are equal, round, and reactive to light.  Cardiovascular:     Rate and Rhythm: Normal rate and regular rhythm.     Heart sounds: Normal heart sounds.  Pulmonary:     Effort: Pulmonary effort is normal.     Breath sounds: Normal breath sounds.  Chest:     Breasts:        Left: Mass and tenderness present.       Comments: Tender mass noted at 11oclock on left breast Abdominal:     General: Bowel sounds are normal.     Palpations: Abdomen is soft.  Musculoskeletal:     Cervical back: Normal range of motion and neck supple.     Right knee: Crepitus present. Normal range of motion. Tenderness present over the medial joint line.     Right lower leg: No edema.     Left lower leg: No edema.  Skin:    General: Skin is warm and dry.  Neurological:     Mental Status: She is alert and oriented to person, place, and time.     Labs reviewed: Basic Metabolic Panel: Recent Labs    08/23/19 0833  NA 142  K 4.1  CL 105  CO2 28  GLUCOSE 89  BUN 12  CREATININE 0.74  CALCIUM 9.6  TSH 1.87   Liver Function Tests: Recent Labs    08/23/19 0833  AST 26  ALT 28  BILITOT 0.6  PROT 6.7   No results for input(s): LIPASE, AMYLASE in the last 8760 hours. No results for input(s): AMMONIA in the last 8760 hours. CBC: Recent Labs    08/23/19 0833  WBC 6.1  NEUTROABS 3,038  HGB 14.7  HCT 43.4  MCV 94.8  PLT 271   Lipid Panel: Recent Labs    08/23/19 0833  CHOL 99  HDL 39*  LDLCALC 38  TRIG 135  CHOLHDL 2.5   TSH: Recent Labs    08/23/19 0833  TSH 1.87   A1C: Lab Results  Component Value Date   HGBA1C 5.6 03/21/2019     Assessment/Plan .1. Estrogen deficiency - DG Bone Density;  Future  2. Breast pain, left With mass noted, very tender on exam. Will send for ultrasound of left breast for further evaluation. - US BREAST ASPIRATION LEFT; Future - US BREAST COMPLETE UNI LEFT INC AXILLA; Future  3. Choking sensation Has since related this to eating peanuts. Educated to avoid all nuts at this time.   4. Essential hypertension Controlled on norvasc and losartan, continue dietary modifications.  - CMP with eGFR([facility]) - CBC with Differential/Platelet  5. Hyperlipidemia, unspecified hyperlipidemia type -continues on lipitor with diet modifications.  - Lipid Panel - CMP with eGFR([facility])  6. Acute pain of right knee Will continue aleve BID for 3 additional days then to stop Can use tylenol 500 mg every 8 hours as needed for pain -to ice knee tid -can apply muscle rub after ice -discussed PT but will wait to see if rest/ice/compression improves symptoms.  - CMP with eGFR([facility])  7. GERD Stable. Will stop Pepcid as she does not feel like it is providing any additional benefit. Continues on omeprazole.  Next appt: follow up in 4 months, sooner if needed  [name] [name] K. [place], [place] [phone]

## 2020-04-11 NOTE — Patient Instructions (Signed)
To ice knee three times daily To apply Voltaren gel up to 4 times daily as needed for pain- not to use before icing  To use tylenol 500 mg by mouth 2 tablets every 8 hours as needed for pain To notify if knee pain does not improve/worsens Would recommend PT  Ultrasound ordered for breast, to be done at [place] [facility]- you can call to schedule if you do not hear from them.

## 2020-04-12 LAB — CBC WITH DIFFERENTIAL/PLATELET
Absolute Monocytes: 689 cells/uL (ref 200–950)
Basophils Absolute: 98 cells/uL (ref 0–200)
Basophils Relative: 1.2 %
Eosinophils Absolute: 189 cells/uL (ref 15–500)
Eosinophils Relative: 2.3 %
HCT: 42.2 % (ref 35.0–45.0)
Hemoglobin: 14.5 g/dL (ref 11.7–15.5)
Lymphs Abs: 3198 cells/uL (ref 850–3900)
MCH: 32.2 pg (ref 27.0–33.0)
MCHC: 34.4 g/dL (ref 32.0–36.0)
MCV: 93.6 fL (ref 80.0–100.0)
MPV: 12.3 fL (ref 7.5–12.5)
Monocytes Relative: 8.4 %
Neutro Abs: 4026 cells/uL (ref 1500–7800)
Neutrophils Relative %: 49.1 %
Platelets: 263 10*3/uL (ref 140–400)
RBC: 4.51 10*6/uL (ref 3.80–5.10)
RDW: 12.3 % (ref 11.0–15.0)
Total Lymphocyte: 39 %
WBC: 8.2 10*3/uL (ref 3.8–10.8)

## 2020-04-12 LAB — LIPID PANEL
Cholesterol: 118 mg/dL (ref <200)
HDL: 45 mg/dL — ABNORMAL LOW (ref >=50)
LDL Cholesterol (Calc): 47 mg/dL (calc)
Non-HDL Cholesterol (Calc): 73 mg/dL (calc) (ref <130)
Total CHOL/HDL Ratio: 2.6 (calc) (ref <5.0)
Triglycerides: 193 mg/dL — ABNORMAL HIGH (ref <150)

## 2020-04-12 LAB — COMPLETE METABOLIC PANEL WITH GFR
AG Ratio: 1.8 (calc) (ref 1.0–2.5)
ALT: 39 U/L — ABNORMAL HIGH (ref 6–29)
AST: 36 U/L — ABNORMAL HIGH (ref 10–35)
Albumin: 4.4 g/dL (ref 3.6–5.1)
Alkaline phosphatase (APISO): 74 U/L (ref 37–153)
BUN: 12 mg/dL (ref 7–25)
CO2: 26 mmol/L (ref 20–32)
Calcium: 9.4 mg/dL (ref 8.6–10.4)
Chloride: 104 mmol/L (ref 98–110)
Creat: 0.61 mg/dL (ref 0.60–0.93)
GFR, Est African American: 104 mL/min/{1.73_m2} (ref >=60)
GFR, Est Non African American: 90 mL/min/{1.73_m2} (ref >=60)
Globulin: 2.5 g/dL (calc) (ref 1.9–3.7)
Glucose, Bld: 85 mg/dL (ref 65–139)
Potassium: 3.9 mmol/L (ref 3.5–5.3)
Sodium: 140 mmol/L (ref 135–146)
Total Bilirubin: 0.9 mg/dL (ref 0.2–1.2)
Total Protein: 6.9 g/dL (ref 6.1–8.1)

## 2020-04-13 ENCOUNTER — Ambulatory Visit
Admission: RE | Admit: 2020-04-13 | Discharge: 2020-04-13 | Disposition: A | Discharge status: Home / Self Care | Payer: Medicare Other | Source: Ambulatory Visit | Attending: Nurse Practitioner | Admitting: Nurse Practitioner

## 2020-04-13 ENCOUNTER — Other Ambulatory Visit: Payer: Self-pay

## 2020-04-13 ENCOUNTER — Other Ambulatory Visit: Payer: Self-pay | Admitting: Nurse Practitioner

## 2020-04-13 ENCOUNTER — Ambulatory Visit: Payer: Medicare Other

## 2020-04-13 DIAGNOSIS — R928 Other abnormal and inconclusive findings on diagnostic imaging of breast: Secondary | ICD-10-CM | POA: Diagnosis not present

## 2020-04-13 DIAGNOSIS — N644 Mastodynia: Secondary | ICD-10-CM

## 2020-04-13 DIAGNOSIS — N6012 Diffuse cystic mastopathy of left breast: Secondary | ICD-10-CM | POA: Diagnosis not present

## 2020-04-16 ENCOUNTER — Other Ambulatory Visit: Payer: Self-pay | Admitting: Nurse Practitioner

## 2020-04-16 DIAGNOSIS — N641 Fat necrosis of breast: Secondary | ICD-10-CM

## 2020-04-19 ENCOUNTER — Encounter: Payer: Self-pay | Admitting: Nurse Practitioner

## 2020-04-20 ENCOUNTER — Other Ambulatory Visit: Payer: Self-pay | Admitting: Nurse Practitioner

## 2020-04-20 DIAGNOSIS — M25561 Pain in right knee: Secondary | ICD-10-CM

## 2020-04-21 ENCOUNTER — Encounter (HOSPITAL_COMMUNITY): Payer: Self-pay | Admitting: Emergency Medicine

## 2020-04-21 ENCOUNTER — Emergency Department (HOSPITAL_COMMUNITY)
Admission: EM | Admit: 2020-04-21 | Discharge: 2020-04-21 | Disposition: A | Discharge status: Home / Self Care | Payer: Medicare Other | Attending: Emergency Medicine | Admitting: Emergency Medicine

## 2020-04-21 ENCOUNTER — Other Ambulatory Visit: Payer: Self-pay

## 2020-04-21 ENCOUNTER — Emergency Department (HOSPITAL_COMMUNITY): Payer: Medicare Other

## 2020-04-21 DIAGNOSIS — Z79899 Other long term (current) drug therapy: Secondary | ICD-10-CM | POA: Diagnosis not present

## 2020-04-21 DIAGNOSIS — I10 Essential (primary) hypertension: Secondary | ICD-10-CM | POA: Insufficient documentation

## 2020-04-21 DIAGNOSIS — R1032 Left lower quadrant pain: Secondary | ICD-10-CM | POA: Diagnosis not present

## 2020-04-21 DIAGNOSIS — I700 Atherosclerosis of aorta: Secondary | ICD-10-CM | POA: Diagnosis not present

## 2020-04-21 DIAGNOSIS — Z90710 Acquired absence of both cervix and uterus: Secondary | ICD-10-CM | POA: Diagnosis not present

## 2020-04-21 DIAGNOSIS — Z7982 Long term (current) use of aspirin: Secondary | ICD-10-CM | POA: Diagnosis not present

## 2020-04-21 DIAGNOSIS — M47816 Spondylosis without myelopathy or radiculopathy, lumbar region: Secondary | ICD-10-CM | POA: Diagnosis not present

## 2020-04-21 DIAGNOSIS — R109 Unspecified abdominal pain: Secondary | ICD-10-CM

## 2020-04-21 LAB — URINALYSIS, ROUTINE W REFLEX MICROSCOPIC
Bacteria, UA: NONE SEEN
Bilirubin Urine: NEGATIVE
Glucose, UA: NEGATIVE mg/dL
Hgb urine dipstick: NEGATIVE
Ketones, ur: NEGATIVE mg/dL
Nitrite: NEGATIVE
Protein, ur: NEGATIVE mg/dL
Specific Gravity, Urine: 1.011 (ref 1.005–1.030)
pH: 5 (ref 5.0–8.0)

## 2020-04-21 MED ORDER — NAPROXEN 500 MG PO TABS
500.0000 mg | ORAL_TABLET | Freq: Two times a day (BID) | ORAL | 0 refills | Status: DC
Start: 2020-04-21 — End: 2020-06-05

## 2020-04-21 NOTE — ED Triage Notes (Signed)
Pt c/o left flank pain that comes and goes since about 1900 yesterday. Pt concerned for kidney stone.

## 2020-04-21 NOTE — Discharge Instructions (Signed)
Your tests are normal, there is no signs of kidney stones, the CT scan was unremarkable  This showed no signs of infection or bleeding  Please take naproxen twice a day as needed, see your doctor if symptoms are not improving but come back to the ER for worsening symptoms

## 2020-04-21 NOTE — ED Provider Notes (Signed)
[facility] EMERGENCY DEPARTMENT Provider Note   CSN: [id number] Arrival date & time: 04/21/20  0249     History Chief Complaint  Patient presents with  . Flank Pain    [name] [name] is a 74 y.o. female.  HPI   This patient is a very pleasant 74 year old female, she has a prior history of a bowel obstruction, history of appendectomy and hysterectomy, she has never had back surgery.  She reports having left-sided flank pain that is intermittent, coming and going since last night, she has had 2 episodes of watery diarrhea this morning but no urinary symptoms other than some frequency a couple of days ago.  She has no nausea or vomiting, she has not been diaphoretic, the pain last between 5 and 10 seconds and is typically in the left flank has radiated towards the midline of her back but does not radiate to the groin.  No history of kidney stones, no history of aneurysm, pain-free at this time.  Nothing seems to make the pain better or worse though she does recall last night taking a deep breath seem to make the left lower back pain worse  Past Medical History:  Diagnosis Date  . Acid reflux   . Bowel obstruction ([place])   . H/O hiatal hernia   . Hyperlipidemia   . Hypertension     Patient Active Problem List   Diagnosis Date Noted  . Hx of small bowel obstruction 03/23/2018  . GERD (gastroesophageal reflux disease) 03/24/2015  . Hypokalemia 06/21/2014  . Hyperglycemia 06/20/2014  . Hypertension   . Hyperlipidemia   . Bowel obstruction ([place]) 10/01/2013  . Fatty liver 02/12/2013  . Transaminitis 02/12/2013    Past Surgical History:  Procedure Laterality Date  . ABDOMINAL HYSTERECTOMY    . APPENDECTOMY    . BREAST CYST EXCISION Right    [phone]? benign  . COLONOSCOPY  03/18/2012   Procedure: COLONOSCOPY;  Surgeon: Rogene Houston, MD;  [facility];  Service: Endoscopy;  Laterality: N/A;  930  . LAPAROTOMY N/A 02/14/2013   Procedure: EXPLORATORY  LAPAROTOMY;  Surgeon: Jamesetta So, MD;  Location: [facility];  Service: General;  Laterality: N/A;  . MELANOMA EXCISION  03/2019   Dr.Hall, removed from nack   . 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 Dr. Nevada Crane, Jenny Reichmann      OB History   No obstetric history on file.     Family History  Problem Relation Age of Onset  . Cancer - Other Mother        Kidney  . Fibromyalgia Mother   . Arthritis Mother   . Cancer - Lung Father 49  . Heart disease Brother 72       had heart transplant then died 3 years later after restarting smoking  . Lung disease Paternal Aunt        Lung Mass  . COPD Paternal Uncle   . Leukemia Paternal Uncle   . Stomach cancer Neg Hx   . Esophageal cancer Neg Hx   . Pancreatic cancer Neg Hx   . Rectal cancer Neg Hx     Social History   Tobacco Use  . Smoking status: Never Smoker  . Smokeless tobacco: Never Used  Vaping Use  . Vaping Use: Never used  Substance Use Topics  . Alcohol use: Yes    Comment: occassionally  . Drug use: No    Home Medications Prior to Admission medications  Medication Sig Start Date End Date Taking? Authorizing Provider  amLODipine (NORVASC) 5 MG tablet TAKE 1 TABLET DAILY 01/02/20   [name], NP  aspirin EC 81 MG tablet Take 81 mg by mouth daily.    [provider]  atorvastatin (LIPITOR) 10 MG tablet TAKE 1 TABLET DAILY 01/05/20   [name], NP  beta carotene w/minerals (OCUVITE) tablet Take 1 tablet by mouth daily.    [provider]  Calcium Carbonate-Vitamin D (CALCIUM 600 + D PO) Take 1 tablet by mouth 2 (two) times daily.    [provider]  Cholecalciferol (VITAMIN D3) 250 MCG (10000 UT) capsule Take 10,000 Units by mouth daily.    [provider]  docusate sodium (COLACE) 100 MG capsule Take 100 mg by mouth 2 (two) times daily.    [provider]  famotidine (PEPCID) 20 MG tablet Take 1 tablet (20 mg total) by mouth at bedtime. 10/04/19  01/19/20  Ngetich, Dinah C, NP  Fish Oil-Cholecalciferol (FISH OIL + D3) 1000-1000 MG-UNIT CAPS Take 1 capsule by mouth 2 (two) times daily.    [provider]  ibuprofen (ADVIL) 600 MG tablet Take 1 tablet (600 mg total) by mouth every 8 (eight) hours as needed for moderate pain. 04/15/19   Avegno, Darrelyn Hillock, FNP  Lactobacillus-Inulin (CULTURELLE DIGESTIVE HEALTH PO) Take 1 capsule by mouth daily.     [provider]  losartan (COZAAR) 50 MG tablet Take 50 mg by mouth daily.    [provider]  Misc Natural Products (OSTEO BI-FLEX ADV DOUBLE ST PO) Take 1 tablet by mouth 2 (two) times daily.    [provider]  Multiple Vitamin (MULTIVITAMIN WITH MINERALS) TABS Take 1 tablet by mouth daily.    [provider]  naproxen (NAPROSYN) 500 MG tablet Take 1 tablet (500 mg total) by mouth 2 (two) times daily with a meal. 04/21/20   Noemi Chapel, MD  omeprazole (PRILOSEC) 40 MG capsule TAKE 1 CAPSULE DAILY 01/09/20   [name], NP  polyethylene glycol powder (GLYCOLAX/MIRALAX) powder Take 17 g by mouth daily.    [provider]  tiZANidine (ZANAFLEX) 4 MG capsule Take 1 capsule (4 mg total) by mouth 3 (three) times daily. 12/27/19   Loura Halt A, NP  vitamin B-12 (CYANOCOBALAMIN) 1000 MCG tablet Take 1 tablet (1,000 mcg total) by mouth daily. 08/24/19   [name], NP  zinc gluconate 50 MG tablet Take 50 mg by mouth daily.    [provider]    Allergies    Patient has no known allergies.  Review of Systems   Review of Systems  All other systems reviewed and are negative.   Physical Exam Updated Vital Signs BP 135/71   Pulse (!) 59   Temp 97.6 F (36.4 C)   Resp 18   Ht 1.626 m (5\' 4" )   Wt 84 kg   LMP 09/10/2014 (Exact Date)   SpO2 96%   BMI 31.79 kg/m   Physical Exam Vitals and nursing note reviewed.  Constitutional:      General: She is not in acute distress.    Appearance: She is well-developed.  HENT:      Head: Normocephalic and atraumatic.     Mouth/Throat:     Pharynx: No oropharyngeal exudate.  Eyes:     General: No scleral icterus.       Right eye: No discharge.        Left eye: No discharge.  Conjunctiva/sclera: Conjunctivae normal.     Pupils: Pupils are equal, round, and reactive to light.  Neck:     Thyroid: No thyromegaly.     Vascular: No JVD.  Cardiovascular:     Rate and Rhythm: Normal rate and regular rhythm.     Heart sounds: Normal heart sounds. No murmur heard.  No friction rub. No gallop.   Pulmonary:     Effort: Pulmonary effort is normal. No respiratory distress.     Breath sounds: Normal breath sounds. No wheezing or rales.  Abdominal:     General: Bowel sounds are normal. There is no distension.     Palpations: Abdomen is soft. There is no mass.     Tenderness: There is no abdominal tenderness.     Comments: No pulsating mass in the abdomen, very nontender  Musculoskeletal:        General: No tenderness. Normal range of motion.     Cervical back: Normal range of motion and neck supple.     Comments: On my exam the patient has no rash over the left flank, she has no tenderness over the bilateral lower back though she does have a small amount of midline tenderness in the lumbar spine though she states it is minimal.  Lymphadenopathy:     Cervical: No cervical adenopathy.  Skin:    General: Skin is warm and dry.     Findings: No erythema or rash.  Neurological:     Mental Status: She is alert.     Coordination: Coordination normal.     Comments: The patient is able to straight leg raise bilaterally, she has normal strength and sensation of the bilateral lower extremities.  Psychiatric:        Behavior: Behavior normal.     ED Results / Procedures / Treatments   Labs (all labs ordered are listed, but only abnormal results are displayed) Labs Reviewed  URINALYSIS, ROUTINE W REFLEX MICROSCOPIC - Abnormal; Notable for the following components:       Result Value   Leukocytes,Ua SMALL (*)    All other components within normal limits    EKG None  Radiology CT Renal Stone Study  Result Date: 04/21/2020 CLINICAL DATA:  74 year old female with acute LEFT flank and abdominal pain. EXAM: CT ABDOMEN AND PELVIS WITHOUT CONTRAST TECHNIQUE: Multidetector CT imaging of the abdomen and pelvis was performed following the standard protocol without IV contrast. COMPARISON:  05/17/2017 CT FINDINGS: Please note that parenchymal abnormalities may be missed without intravenous contrast. Lower chest: No acute abnormality. Hepatobiliary: The liver and gallbladder are unremarkable. No biliary dilatation. Pancreas: Mildly atrophic but otherwise unremarkable Spleen: Unremarkable Adrenals/Urinary Tract: The kidneys, adrenal glands and bladder are unremarkable. Stomach/Bowel: Stomach is within normal limits. No evidence of bowel wall thickening, distention, or inflammatory changes. Vascular/Lymphatic: Aortic atherosclerosis. No enlarged abdominal or pelvic lymph nodes. Reproductive: Status post hysterectomy. No adnexal masses. Other: Minimal mesenteric stranding is again noted. No ascites, pneumoperitoneum or focal collection identified. Musculoskeletal: No acute or suspicious bony abnormalities. Degenerative changes in the lumbar spine noted. IMPRESSION: 1. No evidence of acute abnormality. 2. Aortic Atherosclerosis (ICD10-I70.0). Electronically Signed   By: Margarette Canada M.D.   On: 04/21/2020 08:54    Procedures Procedures (including critical care time)  Medications Ordered in ED Medications - No data to display  ED Course  I have reviewed the triage vital signs and the nursing notes.  Pertinent labs & imaging results that were available during my care of the patient  were reviewed by me and considered in my medical decision making (see chart for details).    MDM Rules/Calculators/A&P                          Patient is well-appearing, she has evidence of  minimal tenderness over her back, her symptoms last 5 to 10 seconds, this is certainly atypical for kidney stones as well as any other acute process, will obtain a CT renal stone protocol to be sure as this would also pick up any enlarged aneurysm.  The patient is agreeable to the plan.  Vital signs are unremarkable, she declines pain medication at this time   Urinalysis negative for acute infection or hematuria  CT scan negative for any acute findings including kidney stones, or other acute structural abnormalities.  There is some aortic atherosclerosis though this would be expected given age  Patient informed of results, stable for discharge, NSAID prescribed for short-term  Final Clinical Impression(s) / ED Diagnoses Final diagnoses:  Left flank pain    Rx / DC Orders ED Discharge Orders         Ordered    naproxen (NAPROSYN) 500 MG tablet  2 times daily with meals        04/21/20 0911           Noemi Chapel, MD 04/21/20 [phone]

## 2020-04-22 ENCOUNTER — Encounter: Payer: Self-pay | Admitting: Nurse Practitioner

## 2020-05-09 ENCOUNTER — Encounter: Payer: Self-pay | Admitting: Physical Therapy

## 2020-05-09 ENCOUNTER — Ambulatory Visit (INDEPENDENT_AMBULATORY_CARE_PROVIDER_SITE_OTHER): Payer: Medicare Other | Admitting: Physical Therapy

## 2020-05-09 ENCOUNTER — Other Ambulatory Visit: Payer: Self-pay

## 2020-05-09 DIAGNOSIS — M25561 Pain in right knee: Secondary | ICD-10-CM | POA: Diagnosis not present

## 2020-05-09 DIAGNOSIS — M25661 Stiffness of right knee, not elsewhere classified: Secondary | ICD-10-CM

## 2020-05-09 DIAGNOSIS — M6281 Muscle weakness (generalized): Secondary | ICD-10-CM

## 2020-05-09 NOTE — Patient Instructions (Signed)
Access Code: [email or web address] URL: [email or web address] Date: 05/09/2020 Prepared by: Faustino Congress  Exercises Supine Bridge - 2 x daily - 7 x weekly - 1-2 sets - 10 reps - 5 sec hold Sidelying Hip Abduction - 2 x daily - 7 x weekly - 1-2 sets - 10 reps - 3 sec hold Seated Straight Leg Raise - 2 x daily - 7 x weekly - 1-2 sets - 10 reps - 3 sec hold Seated Long Arc Quad - 2 x daily - 7 x weekly - 1-2 sets - 10 reps - 3 sec hold

## 2020-05-09 NOTE — Therapy (Signed)
[facility] Physical Therapy [address] [place], [place], [address] Phone: [phone]   Fax:  [phone]  Physical Therapy Evaluation  Patient Details  Name: [name] [name] MRN: [id number] Date of Birth: 1946-04-20 Referring Provider (PT): [name], NP   Encounter Date: 05/09/2020   PT End of Session - 05/09/20 1341    Visit Number 1    Number of Visits 12    Date for PT Re-Evaluation 06/20/20    Progress Note Due on Visit 10    PT Start Time 1316   pt arrived late   PT Stop Time 1340    PT Time Calculation (min) 24 min    Activity Tolerance Patient tolerated treatment well    Behavior During Therapy [facility] for tasks assessed/performed           Past Medical History:  Diagnosis Date  . Acid reflux   . Bowel obstruction ([place])   . H/O hiatal hernia   . Hyperlipidemia   . Hypertension     Past Surgical History:  Procedure Laterality Date  . ABDOMINAL HYSTERECTOMY    . APPENDECTOMY    . BREAST CYST EXCISION Right    [id number]? benign  . COLONOSCOPY  03/18/2012   Procedure: COLONOSCOPY;  Surgeon: Rogene Houston, MD;  [facility]: [facility] ENDO SUITE;  Service: Endoscopy;  Laterality: N/A;  930  . LAPAROTOMY N/A 02/14/2013   Procedure: EXPLORATORY LAPAROTOMY;  Surgeon: Jamesetta So, MD;  Location: [facility] ORS;  Service: General;  Laterality: N/A;  . MELANOMA EXCISION  03/2019   Dr.Hall, removed from nack   . 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 Dr. Nevada Crane, John     There were no vitals filed for this visit.    Subjective Assessment - 05/09/20 1319    Subjective Pt is a 74 y/o female who presents to OPPT for Rt knee pain x 1 month without known injury, which PCP thinks it may be arthritis.   She reports difficulty with stairs.    How long can you walk comfortably? 30 min    Patient Stated Goals improve pain    Currently in Pain? Yes    Pain Score 0-No pain   up to 81/0   Pain Location Knee    Pain Orientation Right     Pain Descriptors / Indicators Aching;Sharp;Sore    Pain Type Acute pain    Pain Onset More than a month ago    Pain Frequency Intermittent    Aggravating Factors  yardwork, stairs    Pain Relieving Factors rest, advil, ice              [facility] PT Assessment - 05/09/20 1322      Assessment   Medical Diagnosis M25.561 (ICD-10-CM) - Acute pain of right knee    Referring Provider (PT) Dewaine Oats, Carlos American, NP    Onset Date/Surgical Date --   1 month ago   Hand Dominance Right    Next MD Visit 08/11/19    Prior Therapy many years ago      Precautions   Precautions None      Restrictions   Weight Bearing Restrictions No      Balance Screen   Has the patient fallen in the past 6 months Yes    How many times? 1    Has the patient had a decrease in activity level because of a fear of falling?  No    Is the patient reluctant to  leave their home because of a fear of falling?  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[place] -exercise class, bingo, reading    Leisure vacation, elliptical doing a few times a week, senior center (fitness center)      Cognition   Overall Cognitive Status Within Functional Limits for tasks assessed      Posture/Postural Control   Posture/Postural Control Postural limitations    Postural Limitations Rounded Shoulders;Forward head      ROM / Strength   AROM / PROM / Strength AROM;Strength      AROM   AROM Assessment Site Knee    Right/Left Knee Right;Left    Right Knee Flexion 118    Left Knee Flexion 131      Strength   Strength Assessment Site Hip;Knee     Right/Left Hip Right;Left    Right Hip Flexion 4/5    Right Hip ABduction 4/5    Left Hip Flexion 5/5    Left Hip ABduction 4/5    Right/Left Knee Right;Left    Right Knee Flexion 4/5    Right Knee Extension 5/5    Left Knee Flexion 5/5    Left Knee Extension 5/5      Flexibility   Soft Tissue Assessment /Muscle Length yes    Hamstrings tightness bil    Quadriceps tightness bil                      Objective measurements completed on examination: See above findings.               PT Education - 05/09/20 1340    Education Details HEP    Person(s) Educated Patient    Methods Explanation;Demonstration;Handout    Comprehension Verbalized understanding;Returned demonstration;Need further instruction            PT Short Term Goals - 05/09/20 1343      PT SHORT TERM GOAL #1   Title independent with initial HEP    Status New    Target Date 05/23/20      PT SHORT TERM GOAL #2   Title FOTO obtained with LTG written    Status New    Target Date 05/23/20             PT Long Term Goals - 05/09/20 1343      PT LONG TERM GOAL #1   Title Independent with HEP    Status New    Target Date 06/20/20      PT LONG TERM GOAL #2   Title RLE strength improved to 4+/5 for improved function    Status New    Target Date 06/20/20      PT LONG TERM GOAL #3   Title report pain < 3/10 with walking up to 1 hour for improved mobility    Status New    Target Date 06/20/20                  Plan - 05/09/20  76    Clinical Impression Statement Pt is a 74 y/o female who presents to OPPT for Rt knee pain.  She demonstrates decreased strength and ROM affecting functional mobility.  Pt will benefit from PT to address deficits listed.    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;Yard Work     Stability/Clinical Decision Making Stable/Uncomplicated    Clinical Decision Making Low    Rehab Potential Good    PT Frequency 2x / week    PT Duration 6 weeks    PT Treatment/Interventions ADLs/Self Care Home Management;Cryotherapy;Electrical Stimulation;Moist Heat;Therapeutic exercise;Therapeutic activities;Functional mobility training;Stair training;Gait training;Neuromuscular re-education;Patient/family education;Manual techniques;Taping;Dry needling;Passive range of motion    PT Next Visit Plan review HEP, establish gym program for strengthening    PT Home Exercise Plan Access Code: [email or web address]    Consulted and Agree with Plan of Care Patient           Patient will benefit from skilled therapeutic intervention in order to improve the following deficits and impairments:  Pain, Decreased strength, Decreased range of motion, Impaired flexibility  Visit Diagnosis: Acute pain of right knee - Plan: PT plan of care cert/re-cert  Muscle weakness (generalized) - Plan: PT plan of care cert/re-cert  Stiffness of right knee, not elsewhere classified - Plan: PT plan of care cert/re-cert     Problem List Patient Active Problem List   Diagnosis Date Noted  . Hx of small bowel obstruction 03/23/2018  . GERD (gastroesophageal reflux disease) 03/24/2015  . Hypokalemia 06/21/2014  . Hyperglycemia 06/20/2014  . Hypertension   . Hyperlipidemia   . Bowel obstruction ([place]) 10/01/2013  . Fatty liver 02/12/2013  . Transaminitis 02/12/2013     Laureen Abrahams, PT, DPT 05/09/20 2:01 PM    [facility] Physical Therapy [address] [place], [place], [address] Phone: [phone]   Fax:  [phone]  Name: [name] [name] MRN: [id number] Date of Birth: 1946/02/01

## 2020-05-17 ENCOUNTER — Ambulatory Visit
Admission: RE | Admit: 2020-05-17 | Discharge: 2020-05-17 | Disposition: A | Discharge status: Home / Self Care | Payer: Medicare Other | Source: Ambulatory Visit | Attending: Nurse Practitioner | Admitting: Nurse Practitioner

## 2020-05-17 ENCOUNTER — Other Ambulatory Visit: Payer: Self-pay

## 2020-05-17 DIAGNOSIS — N641 Fat necrosis of breast: Secondary | ICD-10-CM

## 2020-05-17 DIAGNOSIS — R928 Other abnormal and inconclusive findings on diagnostic imaging of breast: Secondary | ICD-10-CM | POA: Diagnosis not present

## 2020-05-21 ENCOUNTER — Ambulatory Visit (INDEPENDENT_AMBULATORY_CARE_PROVIDER_SITE_OTHER): Payer: Medicare Other | Admitting: Rehabilitative and Restorative Service Providers"

## 2020-05-21 ENCOUNTER — Encounter: Payer: Self-pay | Admitting: Rehabilitative and Restorative Service Providers"

## 2020-05-21 ENCOUNTER — Other Ambulatory Visit: Payer: Self-pay

## 2020-05-21 DIAGNOSIS — M6281 Muscle weakness (generalized): Secondary | ICD-10-CM | POA: Diagnosis not present

## 2020-05-21 DIAGNOSIS — M25661 Stiffness of right knee, not elsewhere classified: Secondary | ICD-10-CM | POA: Diagnosis not present

## 2020-05-21 DIAGNOSIS — M25561 Pain in right knee: Secondary | ICD-10-CM

## 2020-05-21 NOTE — Therapy (Signed)
[facility] Physical Therapy [address] [place], [place], [address] Phone: [phone]   Fax:  [phone]  Physical Therapy Treatment  Patient Details  Name: [name] [name] MRN: [id number] Date of Birth: [birth year] Referring Provider (PT): [name], NP   Encounter Date: 05/21/2020   PT End of Session - 05/21/20 0946    Visit Number 2    Number of Visits 12    Date for PT Re-Evaluation 06/20/20    Progress Note Due on Visit 10    PT Start Time 0930    PT Stop Time 1009    PT Time Calculation (min) 39 min    Activity Tolerance Patient tolerated treatment well    Behavior During Therapy [facility] for tasks assessed/performed           Past Medical History:  Diagnosis Date  . Acid reflux   . Bowel obstruction ([place])   . H/O hiatal hernia   . Hyperlipidemia   . Hypertension     Past Surgical History:  Procedure Laterality Date  . ABDOMINAL HYSTERECTOMY    . APPENDECTOMY    . BREAST CYST EXCISION Right    [phone]? benign  . COLONOSCOPY  03/18/2012   Procedure: COLONOSCOPY;  Surgeon: Rogene Houston, MD;  Location[facility];  Service: Endoscopy;  Laterality: N/A;  930  . LAPAROTOMY N/A 02/14/2013   Procedure: EXPLORATORY LAPAROTOMY;  Surgeon: Jamesetta So, MD;  Location: [facility] ORS;  Service: General;  Laterality: N/A;  . MELANOMA EXCISION  03/2019   Dr.Hall, removed from nack   . 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 Dr. Nevada Crane, [place]     There were no vitals filed for this visit.   Subjective Assessment - 05/21/20 0940    Subjective Pt. indicated doing fairly well with improved knee movement noted.  No specific pain noted today upon arrival.    How long can you walk comfortably? 30 min    Patient Stated Goals improve pain    Currently in Pain? No/denies    Pain Onset More than a month ago                             [facility] Adult PT Treatment/Exercise - 05/21/20 0001      Neuro Re-ed     Neuro Re-ed Details  SLS 30 sec x 4 bilateral c occasional HHA, tandem ambulation 15 ft x 6 fwd      Exercises   Exercises Knee/Hip      Knee/Hip Exercises: Aerobic   Nustep Lvl 6 10 mins UE/LE      Knee/Hip Exercises: Standing   Lateral Step Up Step Height: 6";20 reps;Both   eccentric focus     Knee/Hip Exercises: Seated   Other Seated Knee/Hip Exercises slr 2 x 10 Rt LE    Sit to Sand 20 reps;without UE support   18 inch table                   PT Short Term Goals - 05/21/20 0941      PT SHORT TERM GOAL #1   Title independent with initial HEP    Status Achieved    Target Date 05/23/20      PT SHORT TERM GOAL #2   Title FOTO obtained with LTG written    Baseline 05/21/2020: FOTO intake was closed out prior to 2nd visit    Status Deferred  Target Date 05/23/20             PT Long Term Goals - 05/09/20 1343      PT LONG TERM GOAL #1   Title Independent with HEP    Status New    Target Date 06/20/20      PT LONG TERM GOAL #2   Title RLE strength improved to 4+/5 for improved function    Status New    Target Date 06/20/20      PT LONG TERM GOAL #3   Title report pain < 3/10 with walking up to 1 hour for improved mobility    Status New    Target Date 06/20/20                 Plan - 05/21/20 0942    Clinical Impression Statement Overall knee mobility improved today c no complaints at end range.  Pt. demonstrated good replication of HEP at this time.  Recommend skilled PT c transitioning to HEP as symptoms resolve for continued self care going forward.    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;Yard Work    Stability/Clinical Decision Making Stable/Uncomplicated    Rehab Potential Good    PT Frequency 2x / week    PT Duration 6 weeks    PT Treatment/Interventions ADLs/Self  Care Home Management;Cryotherapy;Electrical Stimulation;Moist Heat;Therapeutic exercise;Therapeutic activities;Functional mobility training;Stair training;Gait training;Neuromuscular re-education;Patient/family education;Manual techniques;Taping;Dry needling;Passive range of motion    PT Next Visit Plan Continue to improve HEP for self care plan when appropriate consisting of strengthening/mobility    PT Home Exercise Plan Access Code: [email or web address]    Consulted and Agree with Plan of Care Patient           Patient will benefit from skilled therapeutic intervention in order to improve the following deficits and impairments:  Pain,Decreased strength,Decreased range of motion,Impaired flexibility  Visit Diagnosis: Acute pain of right knee  Muscle weakness (generalized)  Stiffness of right knee, not elsewhere classified     Problem List Patient Active Problem List   Diagnosis Date Noted  . Hx of small bowel obstruction 03/23/2018  . GERD (gastroesophageal reflux disease) 03/24/2015  . Hypokalemia 06/21/2014  . Hyperglycemia 06/20/2014  . Hypertension   . Hyperlipidemia   . Bowel obstruction ([place]) 10/01/2013  . Fatty liver 02/12/2013  . Transaminitis 02/12/2013    Scot Jun, PT, DPT, OCS, ATC 05/21/20  10:07 AM    [facility] Physical Therapy [address] [place], [place], [address] Phone: [phone]   Fax:  [phone]  Name: [name] [name] MRN: [id number] Date of Birth: 1945-08-14

## 2020-05-22 ENCOUNTER — Ambulatory Visit
Admission: RE | Admit: 2020-05-22 | Discharge: 2020-05-22 | Disposition: A | Discharge status: Home / Self Care | Payer: Medicare Other | Source: Ambulatory Visit | Attending: Family Medicine | Admitting: Family Medicine

## 2020-05-22 ENCOUNTER — Ambulatory Visit (INDEPENDENT_AMBULATORY_CARE_PROVIDER_SITE_OTHER): Payer: Medicare Other

## 2020-05-22 ENCOUNTER — Other Ambulatory Visit: Payer: Self-pay

## 2020-05-22 VITALS — BP 163/108 | HR 95 | Temp 98.1°F | Resp 16

## 2020-05-22 DIAGNOSIS — S99922A Unspecified injury of left foot, initial encounter: Secondary | ICD-10-CM

## 2020-05-22 DIAGNOSIS — M7989 Other specified soft tissue disorders: Secondary | ICD-10-CM | POA: Diagnosis not present

## 2020-05-22 DIAGNOSIS — M79672 Pain in left foot: Secondary | ICD-10-CM | POA: Diagnosis not present

## 2020-05-22 NOTE — ED Provider Notes (Signed)
[facility] CARE CENTER   [id number] 05/22/20 Arrival Time: 1354  OX:[id number] PAIN  SUBJECTIVE: History from: patient. [name] [name] is a 74 y.o. female complains of left foot pain that began about 3 days ago. Reports that a metal table fell onto her the top of her L foot. Localizes the pain to the top of the left foot. Reports that the left foot is swollen and bruised. Describes the pain as intermittent and achy in character. Has tried OTC medications without relief. Has been applying ice to the area. Symptoms are made worse with activity. Denies similar symptoms in the past. Denies fever, chills, effusion, weakness, numbness and tingling, saddle paresthesias, loss of bowel or bladder function.      ROS: As per HPI.  All other pertinent ROS negative.     Past Medical History:  Diagnosis Date   Acid reflux    Bowel obstruction ([facility])    H/O hiatal hernia    Hyperlipidemia    Hypertension    Past Surgical History:  Procedure Laterality Date   ABDOMINAL HYSTERECTOMY     APPENDECTOMY     BREAST CYST EXCISION Right    [phone]? benign   COLONOSCOPY  03/18/2012   Procedure: COLONOSCOPY;  Surgeon: Malissa Hippo, MD;  Location: [facility];  Service: Endoscopy;  Laterality: N/A;  930   LAPAROTOMY N/A 02/14/2013   Procedure: EXPLORATORY LAPAROTOMY;  Surgeon: Dalia Heading, MD;  Location: [facility] ORS;  Service: General;  Laterality: N/A;   MELANOMA EXCISION  03/2019   Dr.Hall, removed from [facility]    SKIN SURGERY  02/2019   Melanoma removed from upper back. Dr. Margo Aye, [place]    No Known Allergies No current facility-administered medications on file prior to encounter.   Current Outpatient Medications on File Prior to Encounter  Medication Sig Dispense Refill   amLODipine (NORVASC) 5 MG tablet TAKE 1 TABLET DAILY 90 tablet 1   aspirin EC 81 MG tablet Take 81 mg by mouth daily.     atorvastatin (LIPITOR) 10 MG tablet TAKE 1 TABLET DAILY 90 tablet 1   beta carotene  w/minerals (OCUVITE) tablet Take 1 tablet by mouth daily.     Calcium Carbonate-Vitamin D (CALCIUM 600 + D PO) Take 1 tablet by mouth 2 (two) times daily.     Cholecalciferol (VITAMIN D3) 250 MCG (10000 UT) capsule Take 10,000 Units by mouth daily.     docusate sodium (COLACE) 100 MG capsule Take 100 mg by mouth 2 (two) times daily.     famotidine (PEPCID) 20 MG tablet Take 1 tablet (20 mg total) by mouth at bedtime. 30 tablet 0   Fish Oil-Cholecalciferol (FISH OIL + D3) 1000-1000 MG-UNIT CAPS Take 1 capsule by mouth 2 (two) times daily.     ibuprofen (ADVIL) 600 MG tablet Take 1 tablet (600 mg total) by mouth every 8 (eight) hours as needed for moderate pain. 30 tablet 0   Lactobacillus-Inulin (CULTURELLE DIGESTIVE HEALTH PO) Take 1 capsule by mouth daily.      losartan (COZAAR) 50 MG tablet Take 50 mg by mouth daily.     Misc Natural Products (OSTEO BI-FLEX ADV DOUBLE ST PO) Take 1 tablet by mouth 2 (two) times daily.     Multiple Vitamin (MULTIVITAMIN WITH MINERALS) TABS Take 1 tablet by mouth daily.     naproxen (NAPROSYN) 500 MG tablet Take 1 tablet (500 mg total) by mouth 2 (two) times daily with a meal. (Patient not taking: Reported on 05/09/2020) 30 tablet 0  omeprazole (PRILOSEC) 40 MG capsule TAKE 1 CAPSULE DAILY 90 capsule 1   polyethylene glycol powder (GLYCOLAX/MIRALAX) powder Take 17 g by mouth daily.     tiZANidine (ZANAFLEX) 4 MG capsule Take 1 capsule (4 mg total) by mouth 3 (three) times daily. 21 capsule 0   vitamin B-12 (CYANOCOBALAMIN) 1000 MCG tablet Take 1 tablet (1,000 mcg total) by mouth daily. 30 tablet 11   zinc gluconate 50 MG tablet Take 50 mg by mouth daily.     Social History   Socioeconomic History   Marital status: Single    Spouse name: Not on file   Number of children: Not on file   Years of education: Not on file   Highest education level: Not on file  Occupational History   Not on file  Tobacco Use   Smoking status: Never Smoker    Smokeless tobacco: Never Used  Vaping Use   Vaping Use: Never used  Substance and Sexual Activity   Alcohol use: Yes    Comment: occassionally   Drug use: No   Sexual activity: Not Currently  Other Topics Concern   Not on file  Social History Narrative   Social History      Diet?       Do you drink/eat things with caffeine? yes      Marital status?            single                        What year were you married?      Do you live in a house, apartment, assisted living, condo, trailer, etc.? house      Is it one or more stories? Yes but seldom go upstairs      How many persons live in your home? 1       Do you have any pets in your home? (please list) no       Highest level of education completed? 12- business college      Current or past profession: retired      Do you exercise?                  yes                    Type & how often? 5 days a week- 1 hour per day      Advanced Directives      Do you have a living will? yes      Do you have a DNR form?                                  If not, do you want to discuss one? yes      Do you have signed POA/HPOA for forms?  yes      Functional Status      Do you have difficulty bathing or dressing yourself? no      Do you have difficulty preparing food or eating? no      Do you have difficulty managing your medications? no      Do you have difficulty managing your finances?  no      Do you have difficulty affording your medications?  no      Social Determinants of Health   Financial Resource Strain: Not on file  Food Insecurity: Not on  file  Transportation Needs: Not on file  Physical Activity: Not on file  Stress: Not on file  Social Connections: Not on file  Intimate Partner Violence: Not on file   Family History  Problem Relation Age of Onset   Cancer - Other Mother        Kidney   Fibromyalgia Mother    Arthritis Mother    Cancer - Lung Father 62   Heart disease Brother 15        had heart transplant then died 3 years later after restarting smoking   Lung disease Paternal Aunt        Lung Mass   COPD Paternal Uncle    Leukemia Paternal Uncle    Stomach cancer Neg Hx    Esophageal cancer Neg Hx    Pancreatic cancer Neg Hx    Rectal cancer Neg Hx     OBJECTIVE:  Vitals:   05/22/20 1407  BP: (!) 163/108  Pulse: 95  Resp: 16  Temp: 98.1 F (36.7 C)  TempSrc: Oral  SpO2: 98%    General appearance: ALERT; in no acute distress.  Head: NCAT Lungs: Normal respiratory effort CV:  pulses 2+ bilaterally. Cap refill < 2 seconds Musculoskeletal:  Inspection: Skin warm, dry, clear and intact. Top pf foot and toes erythematous, swollen, bruised Palpation: Nontender to palpation ROM: limited ROM active and passive to L foot Skin: warm and dry Neurologic: Ambulates without difficulty; Sensation intact about the upper/ lower extremities Psychological: alert and cooperative; normal mood and affect  DIAGNOSTIC STUDIES:  DG Foot Complete Left  Result Date: 05/22/2020 CLINICAL DATA:  Injury.  Pain. EXAM: LEFT FOOT - COMPLETE 3+ VIEW COMPARISON:  Left ankle 04/15/2019. FINDINGS: Diffuse soft tissue swelling. Diffuse osteopenia and degenerative change. No evidence of acute fracture. No evidence of dislocation. Calcaneal spurring and Achilles tendinous calcification again noted without interim change. No radiopaque foreign body. IMPRESSION: Diffuse soft tissue swelling. Diffuse osteopenia and degenerative change. No acute bony abnormality. Electronically Signed   By: Marcello Moores  Register   On: 05/22/2020 14:35     ASSESSMENT & PLAN:  1. Left foot pain    Xray negative today Continue conservative management of rest, ice, and gentle stretches Take ibuprofen as needed for pain relief (may cause abdominal discomfort, ulcers, and GI bleeds avoid taking with other NSAIDs)  Follow up with PCP if symptoms persist Return or go to the ER if you have any new or worsening  symptoms (fever, chills, chest pain, abdominal pain, changes in bowel or bladder habits, pain radiating into lower legs)   Reviewed expectations re: course of current medical issues. Questions answered. Outlined signs and symptoms indicating need for more acute intervention. Patient verbalized understanding. After Visit Summary given.       Faustino Congress, NP 05/22/20 1448

## 2020-05-22 NOTE — ED Triage Notes (Signed)
Metal table fell on LT foot, pt is having pain and bruising

## 2020-05-22 NOTE — Discharge Instructions (Addendum)
Xray negative today  Continue with ice, tylenol and elevation  If symptoms are persisting, follow up with PCP

## 2020-05-30 ENCOUNTER — Other Ambulatory Visit: Payer: Self-pay

## 2020-05-30 ENCOUNTER — Ambulatory Visit (INDEPENDENT_AMBULATORY_CARE_PROVIDER_SITE_OTHER): Payer: Medicare Other | Admitting: Rehabilitative and Restorative Service Providers"

## 2020-05-30 ENCOUNTER — Encounter: Payer: Self-pay | Admitting: Rehabilitative and Restorative Service Providers"

## 2020-05-30 DIAGNOSIS — M6281 Muscle weakness (generalized): Secondary | ICD-10-CM

## 2020-05-30 DIAGNOSIS — M25661 Stiffness of right knee, not elsewhere classified: Secondary | ICD-10-CM

## 2020-05-30 DIAGNOSIS — M25561 Pain in right knee: Secondary | ICD-10-CM | POA: Diagnosis not present

## 2020-05-30 NOTE — Therapy (Signed)
[facility] [address] [place], [place], [address] Phone: [phone]   Fax:  [phone]  Physical Therapy Treatment/Discharge  Patient Details  Name: [name] [name] MRN: [id number] Date of Birth: 1945/10/29 Referring Provider (PT): [name], NP   Encounter Date: 05/30/2020   PT End of Session - 05/30/20 1342    Visit Number 3    Number of Visits 12    Date for PT Re-Evaluation 06/20/20    Progress Note Due on Visit 10    PT Start Time 6301    PT Stop Time 1409    PT Time Calculation (min) 24 min    Activity Tolerance Patient tolerated treatment well    Behavior During Therapy [facility] for tasks assessed/performed           Past Medical History:  Diagnosis Date  . Acid reflux   . Bowel obstruction ([place])   . H/O hiatal hernia   . Hyperlipidemia   . Hypertension     Past Surgical History:  Procedure Laterality Date  . ABDOMINAL HYSTERECTOMY    . APPENDECTOMY    . BREAST CYST EXCISION Right    [phone]? benign  . COLONOSCOPY  03/18/2012   Procedure: COLONOSCOPY;  Surgeon: Rogene Houston, MD;  Location: [facility];  Service: Endoscopy;  Laterality: N/A;  930  . LAPAROTOMY N/A 02/14/2013   Procedure: EXPLORATORY LAPAROTOMY;  Surgeon: Jamesetta So, MD;  Location: [facility] ORS;  Service: General;  Laterality: N/A;  . MELANOMA EXCISION  03/2019   Dr.Hall, removed from nack   . 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 Dr. Nevada Crane, John     There were no vitals filed for this visit.   Subjective Assessment - 05/30/20 1348    Subjective Pt. stated no pain c daily activity for Rt knee.  Pt. indicated unrelated foot injury since last visit that was negative on xray for fracture and feeling better each day.    How long can you walk comfortably? 30 min    Patient Stated Goals improve pain    Currently in Pain? No/denies    Pain Score 0-No pain    Pain Location Knee    Pain Onset More than a month ago               [facility] PT Assessment - 05/30/20 0001      Assessment   Medical Diagnosis M25.561 (ICD-10-CM) - Acute pain of right knee    Referring Provider (PT) [name], NP      AROM   Right Knee Flexion 125      Strength   Right Hip Flexion 5/5    Right Hip ABduction 5/5    Left Hip ABduction 5/5    Right Knee Flexion 5/5    Right Knee Extension 5/5                         [facility] Adult PT Treatment/Exercise - 05/30/20 0001      Exercises   Exercises Other Exercises    Other Exercises  HEP review      Knee/Hip Exercises: Aerobic   Nustep Lvl 6 12 mins UE/LE      Knee/Hip Exercises: Standing   Other Standing Knee Exercises reciprocal gait pattern up/down stairs 1 flight x 2 each way c cues      Knee/Hip Exercises: Seated   Sit to Sand without UE support;10 reps  PT Short Term Goals - 05/21/20 0941      PT SHORT TERM GOAL #1   Title independent with initial HEP    Status Achieved    Target Date 05/23/20      PT SHORT TERM GOAL #2   Title FOTO obtained with LTG written    Baseline 05/21/2020: FOTO intake was closed out prior to 2nd visit    Status Deferred    Target Date 05/23/20             PT Long Term Goals - 05/30/20 1349      PT LONG TERM GOAL #1   Title Independent with HEP    Status Achieved      PT LONG TERM GOAL #2   Title RLE strength improved to 4+/5 for improved function    Status Achieved      PT LONG TERM GOAL #3   Title report pain < 3/10 with walking up to 1 hour for improved mobility    Status Achieved                 Plan - 05/30/20 1350    Clinical Impression Statement Pt. has attended 3 visits overall during course of treatment, reporting good progress, no indicated pain complaints in Rt knee during daily activity.  See objective data for updated information.  Pt. has demonstrated reaching established goals. Recommend d/c to HEP at this time.  Pt. in agreement.     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;Yard Work    Stability/Clinical Decision Making Stable/Uncomplicated    Rehab Potential Good    PT Frequency 2x / week    PT Duration 6 weeks    PT Treatment/Interventions ADLs/Self Care Home Management;Cryotherapy;Electrical Stimulation;Moist Heat;Therapeutic exercise;Therapeutic activities;Functional mobility training;Stair training;Gait training;Neuromuscular re-education;Patient/family education;Manual techniques;Taping;Dry needling;Passive range of motion    PT Next Visit Plan D/C to HEP    PT Home Exercise Plan Access Code: [email or web address]    Consulted and Agree with Plan of Care Patient           Patient will benefit from skilled therapeutic intervention in order to improve the following deficits and impairments:  Pain,Decreased strength,Decreased range of motion,Impaired flexibility  Visit Diagnosis: Acute pain of right knee  Muscle weakness (generalized)  Stiffness of right knee, not elsewhere classified     Problem List Patient Active Problem List   Diagnosis Date Noted  . Hx of small bowel obstruction 03/23/2018  . GERD (gastroesophageal reflux disease) 03/24/2015  . Hypokalemia 06/21/2014  . Hyperglycemia 06/20/2014  . Hypertension   . Hyperlipidemia   . Bowel obstruction ([place]) 10/01/2013  . Fatty liver 02/12/2013  . Transaminitis 02/12/2013   PHYSICAL THERAPY DISCHARGE SUMMARY  Visits from Start of Care: 3  Current functional level related to goals / functional outcomes: See note   Remaining deficits: See note   Education / Equipment: HEP Plan: Patient agrees to discharge.  Patient goals were met. Patient is being discharged due to being pleased with the current functional level.  ?????    Scot Jun, PT, DPT, OCS, ATC 05/30/20  2:12  PM    [facility] [address] [place], [place], [address] Phone: [phone]   Fax:  [phone]  Name: [name] [name] MRN: [id number] Date of Birth: Oct 14, 1945

## 2020-06-03 ENCOUNTER — Encounter: Payer: Self-pay | Admitting: Nurse Practitioner

## 2020-06-05 ENCOUNTER — Other Ambulatory Visit: Payer: Self-pay

## 2020-06-05 ENCOUNTER — Encounter: Payer: Medicare Other | Admitting: Physical Therapy

## 2020-06-05 ENCOUNTER — Encounter: Payer: Self-pay | Admitting: Family

## 2020-06-05 ENCOUNTER — Ambulatory Visit
Admission: RE | Admit: 2020-06-05 | Discharge: 2020-06-05 | Disposition: A | Discharge status: Home / Self Care | Payer: Medicare Other | Source: Ambulatory Visit | Attending: Family | Admitting: Family

## 2020-06-05 ENCOUNTER — Ambulatory Visit (INDEPENDENT_AMBULATORY_CARE_PROVIDER_SITE_OTHER): Payer: Medicare Other | Admitting: Family

## 2020-06-05 VITALS — BP 120/80 | HR 98 | Temp 97.1°F | Resp 16 | Ht 64.0 in | Wt 187.2 lb

## 2020-06-05 DIAGNOSIS — M79675 Pain in left toe(s): Secondary | ICD-10-CM

## 2020-06-05 DIAGNOSIS — M7989 Other specified soft tissue disorders: Secondary | ICD-10-CM

## 2020-06-05 DIAGNOSIS — S9032XA Contusion of left foot, initial encounter: Secondary | ICD-10-CM | POA: Diagnosis not present

## 2020-06-05 DIAGNOSIS — M19072 Primary osteoarthritis, left ankle and foot: Secondary | ICD-10-CM | POA: Diagnosis not present

## 2020-06-05 DIAGNOSIS — M7732 Calcaneal spur, left foot: Secondary | ICD-10-CM | POA: Diagnosis not present

## 2020-06-05 NOTE — Progress Notes (Signed)
Provider: Deeanne Deininger FNP-C  Sharon Seller, NP  Patient Care Team: Sharon Seller, NP as PCP - General (Geriatric Medicine) Sallye Lat, MD as Consulting Physician (Ophthalmology)  Extended Emergency Contact Information Primary Emergency Contact: [facility] Address: [address]          [place], [place] [address] [place] Work Phone: [phone] Mobile Phone: [phone] Relation: Sister Secondary Emergency Contact: Melburn Popper Address: [address]          [place], [place] [address] [place] Home Phone: [phone] Mobile Phone: [phone] Relation: Niece  Code Status: Full Code  Goals of care: Advanced Directive information Advanced Directives 06/05/2020  Does Patient Have a Medical Advance Directive? Yes  Type of Advance Directive Living will;Healthcare Power of [place];Out of facility DNR (pink MOST or yellow form)  Does patient want to make changes to medical advance directive? No - Patient declined  Copy of Healthcare Power of Attorney in Chart? No - copy requested  Would patient like information on creating a medical advance directive? -  Pre-existing out of facility DNR order (yellow form or pink MOST form) -     Chief Complaint  Patient presents with  . Acute Visit    Left foot concerns.     HPI:  Pt is a 75 y.o. female seen today for an acute visit for evaluation of left foot pain and swelling since 05/20/2020.she states was in [place] folding a metal table when the table fell on her foot.she had shoes on at the time of injury.she was evaluated in Urgent Care in [place] on 05/22/2020 and X-rays were done but states had no fracture.states the whole foot was swollen and bruised to the toes and lateral foot but has resolved except top of the foot.Has intermittent pain described as achy.she has been wearing a ankle high compression sock that she bought.she could not wear a shoes after the injury but  now has been wearing her shoes and walking without any difficulty.   Has been using ices,several combined essential ols and Tylenol  She denies any fever, chills,weakness ,numbness or tingling on the foot.   Past Medical History:  Diagnosis Date  . Acid reflux   . Bowel obstruction ([facility])   . H/O hiatal hernia   . Hyperlipidemia   . Hypertension    Past Surgical History:  Procedure Laterality Date  . ABDOMINAL HYSTERECTOMY    . APPENDECTOMY    . BREAST CYST EXCISION Right    [phone]? benign  . COLONOSCOPY  03/18/2012   Procedure: COLONOSCOPY;  Surgeon: Malissa Hippo, MD;  [facility];  Service: Endoscopy;  Laterality: N/A;  930  . LAPAROTOMY N/A 02/14/2013   Procedure: EXPLORATORY LAPAROTOMY;  Surgeon: Dalia Heading, MD;  Location: [facility];  Service: General;  Laterality: N/A;  . MELANOMA EXCISION  03/2019   Dr.Hall, removed from nack   . 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 Dr. Margo Aye, [place]     No Known Allergies  Outpatient Encounter Medications as of 06/05/2020  Medication Sig  . amLODipine (NORVASC) 5 MG tablet TAKE 1 TABLET DAILY  . aspirin EC 81 MG tablet Take 81 mg by mouth daily.  [place] Kitchen atorvastatin (LIPITOR) 10 MG tablet TAKE 1 TABLET DAILY  . beta carotene w/minerals (OCUVITE) tablet Take 1 tablet by mouth daily.  . Calcium Carbonate-Vitamin D (CALCIUM 600 + D PO) Take 1 tablet by mouth 2 (two) times daily.  . Cholecalciferol (  VITAMIN D3) 250 MCG (10000 UT) capsule Take 10,000 Units by mouth daily.  Marland Kitchen docusate sodium (COLACE) 100 MG capsule Take 100 mg by mouth 2 (two) times daily.  . famotidine (PEPCID) 20 MG tablet Take 20 mg by mouth as needed for indigestion or heartburn.  . Fish Oil-Cholecalciferol (FISH OIL + D3) 1000-1000 MG-UNIT CAPS Take 1 capsule by mouth 2 (two) times daily.  Mar[place] Kitchen ibuprofen (ADVIL) 600 MG tablet Take 1 tablet (600 mg total) by mouth every 8 (eight) hours as needed for moderate pain.  . Lactobacillus-Inulin  (CULTURELLE DIGESTIVE HEALTH PO) Take 1 capsule by mouth daily.   Marland Kitchen losartan (COZAAR) 50 MG tablet Take 50 mg by mouth daily.  . Misc Natural Products (OSTEO BI-FLEX ADV DOUBLE ST PO) Take 1 tablet by mouth 2 (two) times daily.  . Multiple Vitamin (MULTIVITAMIN WITH MINERALS) TABS Take 1 tablet by mouth daily.  Mar[place] Kitchen omeprazole (PRILOSEC) 40 MG capsule TAKE 1 CAPSULE DAILY  . polyethylene glycol powder (GLYCOLAX/MIRALAX) powder Take 17 g by mouth daily.  [place] Kitchen tiZANidine (ZANAFLEX) 4 MG capsule Take 1 capsule (4 mg total) by mouth 3 (three) times daily.  . vitamin B-12 (CYANOCOBALAMIN) 1000 MCG tablet Take 1 tablet (1,000 mcg total) by mouth daily.  Marland Kitchen zinc gluconate 50 MG tablet Take 50 mg by mouth daily.  . [DISCONTINUED] famotidine (PEPCID) 20 MG tablet Take 1 tablet (20 mg total) by mouth at bedtime.  . [DISCONTINUED] naproxen (NAPROSYN) 500 MG tablet Take 1 tablet (500 mg total) by mouth 2 (two) times daily with a meal. (Patient not taking: No sig reported)   No facility-administered encounter medications on file as of 06/05/2020.    Review of Systems  Constitutional: Negative for appetite change, chills, fatigue and fever.  Respiratory: Negative for cough, chest tightness, shortness of breath and wheezing.   Cardiovascular: Negative for chest pain, palpitations and leg swelling.  Musculoskeletal: Negative for gait problem, joint swelling and myalgias.       Right dorsal foot swelling   Skin: Negative for color change, pallor and rash.  Neurological: Negative for dizziness, weakness, light-headedness, numbness and headaches.    Immunization History  Administered Date(s) Administered  . Fluad Quad(high Dose 65+) 02/14/2019  . Influenza, High Dose Seasonal PF 03/26/2017, 03/23/2018, 04/03/2020  . Influenza,inj,Quad PF,6+ Mos 02/13/2013, 03/26/2015  . Influenza-Unspecified 06/02/2016  . PFIZER SARS-COV-2 Vaccination 06/24/2019, 07/15/2019, 02/27/2020  . PPD Test 08/10/2018  . Pneumococcal  Conjugate-13 03/24/2014  . Pneumococcal Polysaccharide-23 04/23/2018  . Tdap 03/24/2018  . Tetanus 06/02/2001  . Zoster 06/02/2012  . Zoster Recombinat (Shingrix) 03/24/2018, 06/01/2018   Pertinent  Health Maintenance Due  Topic Date Due  . MAMMOGRAM  02/13/2022  . COLONOSCOPY (Pts 45-38yrs Insurance coverage will need to be confirmed)  03/19/2022  . INFLUENZA VACCINE  Completed  . DEXA SCAN  Completed  . PNA vac Low Risk Adult  Completed   Fall Risk  06/05/2020 04/11/2020 02/16/2020 10/04/2019 09/19/2019  Falls in the past year? 0 1 1 0 1  Number falls in past yr: 0 0 1 0 0  Injury with Fall? 0 0 0 0 1  Comment - - - - [place] ankle  Risk for fall due to : - - - - Impaired balance/gait   Functional Status Survey:    Vitals:   06/05/20 1348  BP: 120/80  Pulse: 98  Resp: 16  Temp: (!) 97.1 F (36.2 C)  SpO2: 96%  Weight: 187 lb 3.2 oz (84.9 kg)  Height: 5'  4" (1.626 m)   Body mass index is 32.13 kg/m. Physical Exam Vitals reviewed.  Constitutional:      General: She is not in acute distress.    Appearance: She is obese. She is not ill-appearing.  Cardiovascular:     Rate and Rhythm: Normal rate and regular rhythm.     Pulses: Normal pulses.     Heart sounds: Normal heart sounds. No murmur heard. No friction rub. No gallop.   Pulmonary:     Effort: Pulmonary effort is normal. No respiratory distress.     Breath sounds: Normal breath sounds. No wheezing, rhonchi or rales.  Musculoskeletal:        General: Normal range of motion.     Right lower leg: No edema.     Left lower leg: No edema.       Feet:     Comments: Right dorsal foot swelling noted tender to palpation.No erythema or ecchymosis.Not warm to touch.   Skin:    General: Skin is warm.     Coloration: Skin is not pale.     Findings: No bruising, erythema or rash.  Neurological:     Mental Status: She is alert and oriented to person, place, and time.     Cranial Nerves: No cranial nerve deficit.     Motor:  No weakness.     Coordination: Coordination normal.     Gait: Gait normal.  Psychiatric:        Mood and Affect: Mood normal.        Behavior: Behavior normal.        Thought Content: Thought content normal.        Judgment: Judgment normal.    Labs reviewed: Recent Labs    08/23/19 0833 04/11/20 1550  NA 142 140  K 4.1 3.9  CL 105 104  CO2 28 26  GLUCOSE 89 85  BUN 12 12  CREATININE 0.74 0.61  CALCIUM 9.6 9.4   Recent Labs    08/23/19 0833 04/11/20 1550  AST 26 36*  ALT 28 39*  BILITOT 0.6 0.9  PROT 6.7 6.9   Recent Labs    08/23/19 0833 04/11/20 1550  WBC 6.1 8.2  NEUTROABS 3,038 4,026  HGB 14.7 14.5  HCT 43.4 42.2  MCV 94.8 93.6  PLT 271 263   Lab Results  Component Value Date   TSH 1.87 08/23/2019   Lab Results  Component Value Date   HGBA1C 5.6 03/21/2019   Lab Results  Component Value Date   CHOL 118 04/11/2020   HDL 45 (L) 04/11/2020   LDLCALC 47 04/11/2020   TRIG 193 (H) 04/11/2020   CHOLHDL 2.6 04/11/2020    Significant Diagnostic Results in last 30 days:  DG Foot Complete Left  Result Date: 05/22/2020 CLINICAL DATA:  Injury.  Pain. EXAM: LEFT FOOT - COMPLETE 3+ VIEW COMPARISON:  Left ankle 04/15/2019. FINDINGS: Diffuse soft tissue swelling. Diffuse osteopenia and degenerative change. No evidence of acute fracture. No evidence of dislocation. Calcaneal spurring and Achilles tendinous calcification again noted without interim change. No radiopaque foreign body. IMPRESSION: Diffuse soft tissue swelling. Diffuse osteopenia and degenerative change. No acute bony abnormality. Electronically Signed   By: Maisie Fus  Register   On: 05/22/2020 14:35   US BREAST LTD UNI LEFT INC AXILLA  Result Date: 05/17/2020 CLINICAL DATA:  75 year old female presenting for 1 month follow-up of probably benign left breast asymmetry/fat necrosis. EXAM: DIGITAL DIAGNOSTIC LEFT MAMMOGRAM WITH TOMO ULTRASOUND LEFT BREAST COMPARISON:  Previous  exam(s). ACR Breast Density  Category b: There are scattered areas of fibroglandular density. FINDINGS: Mammogram: Full field tomosynthesis views of the left breast performed. There is a persistent regional asymmetry in the medial left breast with numerable associated oil cyst, consistent with benign fat necrosis. There is no new suspicious mass, calcification or distortion. On physical exam, I palpate a small mass in the upper inner left breast which patient states has not significantly changed in size compared to the prior mammogram. There is no skin erythema or bruising. Ultrasound: Targeted ultrasound is performed throughout the medial and upper inner aspect of the left breast demonstrating mildly echogenic tissue and numerable small cysts and cystic changes. No suspicious solid mass. IMPRESSION: Benign fat necrosis in the medial aspect of the left breast. Patient reports this may have occurred while she was moving boxes last month. RECOMMENDATION: Return to routine annual screening mammography which will be due in September 2022. I have discussed the findings and recommendations with the patient. If applicable, a reminder letter will be sent to the patient regarding the next appointment. BI-RADS CATEGORY  2: Benign. Electronically Signed   By: Emmaline Kluver M.D.   On: 05/17/2020 14:46   MM DIAG BREAST TOMO UNI LEFT  Result Date: 05/17/2020 CLINICAL DATA:  75 year old female presenting for 1 month follow-up of probably benign left breast asymmetry/fat necrosis. EXAM: DIGITAL DIAGNOSTIC LEFT MAMMOGRAM WITH TOMO ULTRASOUND LEFT BREAST COMPARISON:  Previous exam(s). ACR Breast Density Category b: There are scattered areas of fibroglandular density. FINDINGS: Mammogram: Full field tomosynthesis views of the left breast performed. There is a persistent regional asymmetry in the medial left breast with numerable associated oil cyst, consistent with benign fat necrosis. There is no new suspicious mass, calcification or distortion. On  physical exam, I palpate a small mass in the upper inner left breast which patient states has not significantly changed in size compared to the prior mammogram. There is no skin erythema or bruising. Ultrasound: Targeted ultrasound is performed throughout the medial and upper inner aspect of the left breast demonstrating mildly echogenic tissue and numerable small cysts and cystic changes. No suspicious solid mass. IMPRESSION: Benign fat necrosis in the medial aspect of the left breast. Patient reports this may have occurred while she was moving boxes last month. RECOMMENDATION: Return to routine annual screening mammography which will be due in September 2022. I have discussed the findings and recommendations with the patient. If applicable, a reminder letter will be sent to the patient regarding the next appointment. BI-RADS CATEGORY  2: Benign. Electronically Signed   By: Emmaline Kluver M.D.   On: 05/17/2020 14:46    Assessment/Plan  Pain and swelling of toe of left foot Light dorsal foot swelling noted tender to palpation.No erythema or ecchymosis.Not warm to touch.  - Advised to get left foot X-ray done at [facility] over [place] Then will call with results - Apply cold compressor daily  - Keep leg elevated when seated  - Soak foot in Epsom salt 5-10 minutes daily to keep swelling down  - Hold Asprin until swelling resolve. - Take Extra Strength Tylenol 500 mg tablet one by mouth every 8 hrs as needed for pain.  - DG Foot Complete Left; Future  Family/ staff Communication: Reviewed plan of care with patient verbalize understanding.  Labs/tests ordered: - DG Foot Complete Left; Future  Next Appointment: As needed if symptoms worsen or fail to improve.   Caesar Bookman, NP

## 2020-06-05 NOTE — Patient Instructions (Addendum)
-   Please left  foot X-ray done at [facility] over [place]  Then will call with results - Apply cold compressor daily  - Keep leg elevated when seated  - Soak foot in Epsom salt 5-10 minutes daily to keep swelling down  - Hold Asprin until swelling resolve. - Take Extra Strength Tylenol 500 mg tablet one by mouth every 8 hrs as needed for pain.

## 2020-06-07 ENCOUNTER — Encounter: Payer: Medicare Other | Admitting: Physical Therapy

## 2020-06-14 DIAGNOSIS — Z08 Encounter for follow-up examination after completed treatment for malignant neoplasm: Secondary | ICD-10-CM | POA: Diagnosis not present

## 2020-06-14 DIAGNOSIS — D225 Melanocytic nevi of trunk: Secondary | ICD-10-CM | POA: Diagnosis not present

## 2020-06-14 DIAGNOSIS — L570 Actinic keratosis: Secondary | ICD-10-CM | POA: Diagnosis not present

## 2020-06-14 DIAGNOSIS — X32XXXD Exposure to sunlight, subsequent encounter: Secondary | ICD-10-CM | POA: Diagnosis not present

## 2020-06-14 DIAGNOSIS — Z85820 Personal history of malignant melanoma of skin: Secondary | ICD-10-CM | POA: Diagnosis not present

## 2020-06-14 DIAGNOSIS — Z1283 Encounter for screening for malignant neoplasm of skin: Secondary | ICD-10-CM | POA: Diagnosis not present

## 2020-06-16 ENCOUNTER — Other Ambulatory Visit: Payer: Self-pay | Admitting: Nurse Practitioner

## 2020-06-30 ENCOUNTER — Other Ambulatory Visit: Payer: Self-pay | Admitting: Nurse Practitioner

## 2020-07-12 ENCOUNTER — Other Ambulatory Visit: Payer: Medicare Other

## 2020-07-12 DIAGNOSIS — Z20822 Contact with and (suspected) exposure to covid-19: Secondary | ICD-10-CM

## 2020-07-13 ENCOUNTER — Encounter: Payer: Self-pay | Admitting: Nurse Practitioner

## 2020-07-13 ENCOUNTER — Telehealth (INDEPENDENT_AMBULATORY_CARE_PROVIDER_SITE_OTHER): Payer: Medicare Other | Admitting: Nurse Practitioner

## 2020-07-13 ENCOUNTER — Telehealth: Payer: Self-pay

## 2020-07-13 ENCOUNTER — Other Ambulatory Visit: Payer: Self-pay

## 2020-07-13 DIAGNOSIS — U071 COVID-19: Secondary | ICD-10-CM

## 2020-07-13 LAB — SARS-COV-2, NAA 2 DAY TAT

## 2020-07-13 LAB — NOVEL CORONAVIRUS, NAA: SARS-CoV-2, NAA: DETECTED — AB

## 2020-07-13 NOTE — Telephone Encounter (Signed)
have Covid thru rapid test. I went to get the other test but haven't heard yet. What should I do?  Is there any meds I need to take.  I'm taking more zinc, vitamins c, vitamin D, cough syrup.is there anything else I can do      Video visit set for 10 am this morning.

## 2020-07-13 NOTE — Progress Notes (Signed)
Careteam: Patient Care Team: [name], NP as PCP - General (Geriatric Medicine) Warden Fillers, MD as Consulting Physician (Ophthalmology)  Advanced Directive information    No Known Allergies  Chief Complaint  Patient presents with  . Acute Visit    Patient states she had positive rapid COVID test at nursing facility.She states she was told to go get the other test.  She states that she went to have another test done but has not received results yet. Patient has been taking Zinc, Vitamin C, Vitamin D, cough syrup. Is there anything else she can take.Started having symptoms on Wed. 07/11/20. Stuffy nose,headache, Body aches. Chills off and on. Mild fever 99.6. Denies loss of taste, smell, appetite and diarrhea.Took melatonin and pepcid to help sleep     HPI: Patient is a 75 y.o. female due to positive COVID test.  Reports 6 church members came down with COVID since Sunday. 2 days ago started having a snuffy nose with mild headache. 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  Episode of diarrhea today.  Bad headache Feeling achy but took tylenol this morning and now feels morning.  Mild cough. Using cough drops to suppress cough. Clear sputum. No chest congestion.  Mild weakness.  No shortness of breath.  She is taking zinc, vit d and vit c   She was told to take melatonin and pepcid and slept well last night (the night before could not sleep)     Review of Systems:  Review of Systems  Constitutional: Positive for chills, fever and malaise/fatigue.  HENT: Positive for congestion. Negative for sore throat.   Respiratory: Positive for cough and sputum production. Negative for shortness of breath and wheezing.   Cardiovascular: Negative for chest pain.  Neurological: Positive for headaches. Negative for dizziness.    Past Medical History:  Diagnosis Date  . Acid reflux   . Bowel obstruction ([place])   . H/O hiatal hernia   . Hyperlipidemia   .  Hypertension    Past Surgical History:  Procedure Laterality Date  . ABDOMINAL HYSTERECTOMY    . APPENDECTOMY    . BREAST CYST EXCISION Right    [phone]? benign  . COLONOSCOPY  03/18/2012   Procedure: COLONOSCOPY;  Surgeon: Rogene Houston, MD;  [facility];  Service: Endoscopy;  Laterality: N/A;  930  . LAPAROTOMY N/A 02/14/2013   Procedure: EXPLORATORY LAPAROTOMY;  Surgeon: Jamesetta So, MD;  Location: [facility];  Service: General;  Laterality: N/A;  . MELANOMA EXCISION  03/2019   Dr.Hall, removed from nack   . 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 Dr. Nevada Crane, Jenny Reichmann    Social History:   reports that she has never smoked. She has never used smokeless tobacco. She reports current alcohol use. She reports that she does not use drugs.  Family History  Problem Relation Age of Onset  . Cancer - Other Mother        Kidney  . Fibromyalgia Mother   . Arthritis Mother   . Cancer - Lung Father 25  . Heart disease Brother 21       had heart transplant then died 3 years later after restarting smoking  . Lung disease Paternal Aunt        Lung Mass  . COPD Paternal Uncle   . Leukemia Paternal Uncle   . Stomach cancer Neg Hx   . Esophageal cancer Neg Hx   . Pancreatic cancer Neg  Hx   . Rectal cancer Neg Hx     Medications: Patient's Medications  New Prescriptions   No medications on file  Previous Medications   AMLODIPINE (NORVASC) 5 MG TABLET    TAKE 1 TABLET DAILY   ASPIRIN EC 81 MG TABLET    Take 81 mg by mouth daily.   ATORVASTATIN (LIPITOR) 10 MG TABLET    TAKE 1 TABLET DAILY   BETA CAROTENE W/MINERALS (OCUVITE) TABLET    Take 1 tablet by mouth daily.   CALCIUM CARBONATE-VITAMIN D (CALCIUM 600 + D PO)    Take 1 tablet by mouth 2 (two) times daily.   CHOLECALCIFEROL (VITAMIN D3) 250 MCG (10000 UT) CAPSULE    Take 10,000 Units by mouth daily.   DOCUSATE SODIUM (COLACE) 100 MG CAPSULE    Take 100 mg by mouth 2 (two) times daily.   FAMOTIDINE (PEPCID)  20 MG TABLET    Take 20 mg by mouth as needed for indigestion or heartburn.   FISH OIL-CHOLECALCIFEROL (FISH OIL + D3) 1000-1000 MG-UNIT CAPS    Take 1 capsule by mouth 2 (two) times daily.   IBUPROFEN (ADVIL) 600 MG TABLET    Take 1 tablet (600 mg total) by mouth every 8 (eight) hours as needed for moderate pain.   LACTOBACILLUS-INULIN (CULTURELLE DIGESTIVE HEALTH PO)    Take 1 capsule by mouth daily.    LOSARTAN (COZAAR) 50 MG TABLET    Take 50 mg by mouth daily.   MISC NATURAL PRODUCTS (OSTEO BI-FLEX ADV DOUBLE ST PO)    Take 1 tablet by mouth 2 (two) times daily.   MULTIPLE VITAMIN (MULTIVITAMIN WITH MINERALS) TABS    Take 1 tablet by mouth daily.   OMEPRAZOLE (PRILOSEC) 40 MG CAPSULE    TAKE 1 CAPSULE DAILY   POLYETHYLENE GLYCOL POWDER (GLYCOLAX/MIRALAX) POWDER    Take 17 g by mouth daily.   TIZANIDINE (ZANAFLEX) 4 MG CAPSULE    Take 1 capsule (4 mg total) by mouth 3 (three) times daily.   VITAMIN B-12 (CYANOCOBALAMIN) 1000 MCG TABLET    Take 1 tablet (1,000 mcg total) by mouth daily.   ZINC GLUCONATE 50 MG TABLET    Take 50 mg by mouth daily.  Modified Medications   No medications on file  Discontinued Medications   No medications on file    Physical Exam:  There were no vitals filed for this visit. There is no height or weight on file to calculate BMI. Wt Readings from Last 3 Encounters:  06/05/20 187 lb 3.2 oz (84.9 kg)  04/21/20 185 lb 3 oz (84 kg)  04/11/20 183 lb 6.4 oz (83.2 kg)    Physical Exam Constitutional:      Appearance: Normal appearance.  Pulmonary:     Effort: Pulmonary effort is normal.  Neurological:     Mental Status: She is alert.  Psychiatric:        Mood and Affect: Mood normal.     Labs reviewed: Basic Metabolic Panel: Recent Labs    08/23/19 0833 04/11/20 1550  NA 142 140  K 4.1 3.9  CL 105 104  CO2 28 26  GLUCOSE 89 85  BUN 12 12  CREATININE 0.74 0.61  CALCIUM 9.6 9.4  TSH 1.87  --    Liver Function Tests: Recent Labs     08/23/19 0833 04/11/20 1550  AST 26 36*  ALT 28 39*  BILITOT 0.6 0.9  PROT 6.7 6.9   No results for input(s): LIPASE, AMYLASE in  the last 8760 hours. No results for input(s): AMMONIA in the last 8760 hours. CBC: Recent Labs    08/23/19 0833 04/11/20 1550  WBC 6.1 8.2  NEUTROABS 3,038 4,026  HGB 14.7 14.5  HCT 43.4 42.2  MCV 94.8 93.6  PLT 271 263   Lipid Panel: Recent Labs    08/23/19 0833 04/11/20 1550  CHOL 99 118  HDL 39* 45*  LDLCALC 38 47  TRIG 135 193*  CHOLHDL 2.5 2.6   TSH: Recent Labs    08/23/19 0833  TSH 1.87   A1C: Lab Results  Component Value Date   HGBA1C 5.6 03/21/2019     Assessment/Plan 1. COVID-19 Tested positive 2 days ago, Supportive care discuss  Nasal wash daily  Plain nasal saline spray throughout the day as needed May use tylenol 500 mg every 8 hours as needed aches and pains or sore throat humidifier in the home to help with the dry air Mucinex DM by mouth twice daily as needed for cough and congestion with full glass of water  Keep well hydrated Avoid forcefully blowing nose Continue vitamin and supplements.  Return precautions/when to go to ED discussed in detail with pt.    Janett Billow K. Harle Battiest  [facility] [phone]    Virtual Visit via Deloris Ping  I connected with patient on 07/13/20 at 10:00 AM EST by video and verified that I am speaking with the correct person using two identifiers.  Location: Patient: home Provider: [place]   I discussed the limitations, risks, security and privacy concerns of performing an evaluation and management service by telephone and the availability of in person appointments. I also discussed with the patient that there may be a patient responsible charge related to this service. The patient expressed understanding and agreed to proceed.   I discussed the assessment and treatment plan with the patient. The patient was provided an opportunity to ask  questions and all were answered. The patient agreed with the plan and demonstrated an understanding of the instructions.   The patient was advised to call back or seek an in-person evaluation if the symptoms worsen or if the condition fails to improve as anticipated.  I provided 20 minutes of non-face-to-face time during this encounter.  Carlos American. Harle Battiest Avs printed and mailed

## 2020-07-13 NOTE — Progress Notes (Signed)
This service is provided via telemedicine  No vital signs collected/recorded due to the encounter was a telemedicine visit.   Location of patient (ex: home, work):  Home  Patient consents to a telephone visit:  Yes, see encounter dated 09/19/2019  Location of the provider (ex: office, home):  [facility]  Name of any referring provider:  N/A  Names of all persons participating in the telemedicine service and their role in the encounter: Sherrie Mustache, Nurse Practitioner, Carroll Kinds, CMA, and patient.   Time spent on call:  10 minutes with medical assistant

## 2020-07-13 NOTE — Patient Instructions (Addendum)
Nasal wash daily  Plain nasal saline spray throughout the day as needed May use tylenol 500 mg every 8 hours as needed aches and pains or sore throat humidifier in the home to help with the dry air Mucinex DM by mouth twice daily as needed for cough and congestion with full glass of water  Keep well hydrated Avoid forcefully blowing nose Continue vitamin and supplements.

## 2020-07-14 ENCOUNTER — Telehealth: Payer: Self-pay | Admitting: Unknown Physician Specialty

## 2020-07-14 NOTE — Telephone Encounter (Signed)
Called to discuss with patient about COVID-19 symptoms and the use of one of the available treatments for those with mild to moderate Covid symptoms and at a high risk of hospitalization.  Pt appears to qualify for outpatient treatment due to co-morbid conditions and/or a member of an at-risk group in accordance with the FDA Emergency Use Authorization.    Symptom onset: 12/9 Vaccinated: yes Booster? yes Immunocompromised? no Qualifiers: multiple  Offered oral tx. Pt is feeling better and refused. Gave her hotline number for worsening sxs. Will be eligible for IV tx till Tuesday.    [name] [name]

## 2020-07-16 NOTE — Telephone Encounter (Signed)
[name] [name], CMA  [facility] Clinical Pool 3 days ago     Appointment has been set with [name] [name] for 10 am for a virtual visit.    Message text

## 2020-07-18 ENCOUNTER — Encounter: Payer: Self-pay | Admitting: Nurse Practitioner

## 2020-07-19 ENCOUNTER — Ambulatory Visit (INDEPENDENT_AMBULATORY_CARE_PROVIDER_SITE_OTHER): Payer: Medicare Other | Admitting: Family

## 2020-07-19 ENCOUNTER — Other Ambulatory Visit: Payer: Self-pay

## 2020-07-19 ENCOUNTER — Encounter: Payer: Self-pay | Admitting: Family

## 2020-07-19 ENCOUNTER — Telehealth: Payer: Self-pay | Admitting: Family

## 2020-07-19 VITALS — BP 126/90 | HR 73 | Temp 97.1°F | Resp 16 | Ht 64.0 in | Wt 187.0 lb

## 2020-07-19 DIAGNOSIS — R079 Chest pain, unspecified: Secondary | ICD-10-CM

## 2020-07-19 DIAGNOSIS — R059 Cough, unspecified: Secondary | ICD-10-CM

## 2020-07-19 NOTE — Progress Notes (Signed)
Provider: York Valliant FNP-C  [name], NP  Patient Care Team: [name], NP as PCP - General (Geriatric Medicine) Warden Fillers, MD as Consulting Physician (Ophthalmology)  Extended Emergency Contact Information Primary Emergency Contact: [facility] Address: [address]          [place], [place] [address] [place] Work Phone: [phone] Mobile Phone: [phone] Relation: Sister Secondary Emergency Contact: Tia Masker Address: [address]          [place], [place] [address] Johnnette Litter of [place] Phone: [phone] Mobile Phone: [phone] Relation: Niece  Code Status: Full Code  Goals of care: Advanced Directive information Advanced Directives 07/19/2020  Does Patient Have a Medical Advance Directive? Yes  Type of Paramedic of [place];Living will  Does patient want to make changes to medical advance directive? No - Patient declined  Copy of [place] in Chart? Yes - validated most recent copy scanned in chart (See row information)  Would patient like information on creating a medical advance directive? -  Pre-existing out of facility DNR order (yellow form or pink MOST form) -     Chief Complaint  Patient presents with  . Acute Visit    Complains of pain on right side of chest.    HPI:  Pt is a 75 y.o. female seen today for an acute visit for evaluation of right side chest pain x 3 days since Tuesday 07/17/2020 night.Pain is described as intermittent on and off.Has had no pain today.Pain does not radiate to arm or jaw.she denies any fatigue,palpitation or shortness of breath. She status post COVID-19 positive seen 07/13/2020 by PCP Dani Gobble for COVID-19 with chills,fever,fatigue,nasal congestion ,headache and productive cough.Nasal saline spray,Mucinex DM PRN,Vitamin supplement,hydration and supportive care was recommended.she states feels much better  symptoms have resolved except lingering slight cough.wonders whether pain on her right side chest is from COVID-19. No pain with deep breathing.    Past Medical History:  Diagnosis Date  . Acid reflux   . Bowel obstruction ([place])   . H/O hiatal hernia   . Hyperlipidemia   . Hypertension    Past Surgical History:  Procedure Laterality Date  . ABDOMINAL HYSTERECTOMY    . APPENDECTOMY    . BREAST CYST EXCISION Right    [phone]? benign  . COLONOSCOPY  03/18/2012   Procedure: COLONOSCOPY;  Surgeon: Rogene Houston, MD;  [facility];  Service: Endoscopy;  Laterality: N/A;  930  . LAPAROTOMY N/A 02/14/2013   Procedure: EXPLORATORY LAPAROTOMY;  Surgeon: Jamesetta So, MD;  Location: [facility];  Service: General;  Laterality: N/A;  . MELANOMA EXCISION  03/2019   Dr.Hall, removed from nack   . 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 Dr. Nevada Crane, John     No Known Allergies  Outpatient Encounter Medications as of 07/19/2020  Medication Sig  . amLODipine (NORVASC) 5 MG tablet TAKE 1 TABLET DAILY  . aspirin EC 81 MG tablet Take 81 mg by mouth daily.  Marland Kitchen atorvastatin (LIPITOR) 10 MG tablet TAKE 1 TABLET DAILY  . beta carotene w/minerals (OCUVITE) tablet Take 1 tablet by mouth daily.  . Calcium Carbonate-Vitamin D (CALCIUM 600 + D PO) Take 1 tablet by mouth 2 (two) times daily.  . Cholecalciferol (VITAMIN D3) 250 MCG (10000 UT) capsule Take 10,000 Units by mouth daily.  Marland Kitchen docusate sodium (COLACE) 100 MG capsule Take 100 mg by mouth 2 (two) times daily.  Marland Kitchen  famotidine (PEPCID) 20 MG tablet Take 20 mg by mouth as needed for indigestion or heartburn.  . Fish Oil-Cholecalciferol (FISH OIL + D3) 1000-1000 MG-UNIT CAPS Take 1 capsule by mouth 2 (two) times daily.  Marland Kitchen ibuprofen (ADVIL) 600 MG tablet Take 1 tablet (600 mg total) by mouth every 8 (eight) hours as needed for moderate pain.  . Lactobacillus-Inulin (CULTURELLE DIGESTIVE HEALTH PO) Take 1 capsule by mouth daily.   Marland Kitchen  losartan (COZAAR) 50 MG tablet Take 50 mg by mouth daily.  . Misc Natural Products (OSTEO BI-FLEX ADV DOUBLE ST PO) Take 1 tablet by mouth 2 (two) times daily.  . Multiple Vitamin (MULTIVITAMIN WITH MINERALS) TABS Take 1 tablet by mouth daily.  Marland Kitchen omeprazole (PRILOSEC) 40 MG capsule TAKE 1 CAPSULE DAILY  . polyethylene glycol powder (GLYCOLAX/MIRALAX) powder Take 17 g by mouth daily.  Marland Kitchen tiZANidine (ZANAFLEX) 4 MG capsule Take 1 capsule (4 mg total) by mouth 3 (three) times daily.  . vitamin B-12 (CYANOCOBALAMIN) 1000 MCG tablet Take 1 tablet (1,000 mcg total) by mouth daily.  Marland Kitchen zinc gluconate 50 MG tablet Take 50 mg by mouth daily.   No facility-administered encounter medications on file as of 07/19/2020.    Review of Systems  Constitutional: Negative for appetite change, chills, fatigue and fever.  HENT: Negative for congestion, rhinorrhea, sinus pressure, sinus pain, sneezing and sore throat.   Respiratory: Positive for cough. Negative for chest tightness, shortness of breath and wheezing.   Cardiovascular: Negative for palpitations and leg swelling.       Right side chest pain   Gastrointestinal: Negative for abdominal distention, abdominal pain, constipation, diarrhea, nausea and vomiting.  Skin: Negative for color change, pallor and rash.  Neurological: Negative for dizziness, speech difficulty, weakness, light-headedness and headaches.    Immunization History  Administered Date(s) Administered  . Fluad Quad(high Dose 65+) 02/14/2019  . Influenza, High Dose Seasonal PF 03/26/2017, 03/23/2018, 04/03/2020  . Influenza,inj,Quad PF,6+ Mos 02/13/2013, 03/26/2015  . Influenza-Unspecified 06/02/2016  . PFIZER(Purple Top)SARS-COV-2 Vaccination 06/24/2019, 07/15/2019, 02/27/2020  . PPD Test 08/10/2018  . Pneumococcal Conjugate-13 03/24/2014  . Pneumococcal Polysaccharide-23 04/23/2018  . Tdap 03/24/2018  . Tetanus 06/02/2001  . Zoster 06/02/2012  . Zoster Recombinat (Shingrix)  03/24/2018, 06/01/2018   Pertinent  Health Maintenance Due  Topic Date Due  . MAMMOGRAM  02/13/2022  . COLONOSCOPY (Pts 45-53yrs Insurance coverage will need to be confirmed)  03/19/2022  . INFLUENZA VACCINE  Completed  . DEXA SCAN  Completed  . PNA vac Low Risk Adult  Completed   Fall Risk  07/19/2020 06/05/2020 04/11/2020 02/16/2020 10/04/2019  Falls in the past year? 0 0 1 1 0  Number falls in past yr: 0 0 0 1 0  Injury with Fall? 0 0 0 0 0  Comment - - - - -  Risk for fall due to : - - - - -   Functional Status Survey:    Vitals:   07/19/20 0902  BP: 126/90  Pulse: 73  Resp: 16  Temp: (!) 97.1 F (36.2 C)  SpO2: 93%  Weight: 187 lb (84.8 kg)  Height: 5\' 4"  (1.626 m)   Body mass index is 32.1 kg/m. Physical Exam Vitals reviewed.  Constitutional:      General: She is not in acute distress.    Appearance: She is obese. She is not ill-appearing.  HENT:     Head: Normocephalic.     Right Ear: Tympanic membrane, ear canal and external ear normal. There is  no impacted cerumen.     Left Ear: Tympanic membrane, ear canal and external ear normal. There is no impacted cerumen.     Nose: Nose normal. No congestion or rhinorrhea.     Mouth/Throat:     Mouth: Mucous membranes are moist.     Pharynx: Oropharynx is clear. No oropharyngeal exudate or posterior oropharyngeal erythema.  Eyes:     General: No scleral icterus.       Right eye: No discharge.        Left eye: No discharge.     Conjunctiva/sclera: Conjunctivae normal.     Pupils: Pupils are equal, round, and reactive to light.  Neck:     Vascular: No carotid bruit.  Cardiovascular:     Rate and Rhythm: Normal rate and regular rhythm.     Pulses: Normal pulses.     Heart sounds: Normal heart sounds. No murmur heard. No friction rub. No gallop.   Pulmonary:     Effort: Pulmonary effort is normal. No respiratory distress.     Breath sounds: No wheezing, rhonchi or rales.  Chest:     Chest wall: No tenderness.   Abdominal:     General: Bowel sounds are normal. There is no distension.     Palpations: Abdomen is soft. There is no mass.     Tenderness: There is no abdominal tenderness. There is no right CVA tenderness, left CVA tenderness, guarding or rebound.  Musculoskeletal:        General: No swelling. Normal range of motion.     Cervical back: Normal range of motion. No rigidity or tenderness.     Right lower leg: No edema.     Left lower leg: No edema.  Lymphadenopathy:     Cervical: No cervical adenopathy.  Skin:    General: Skin is warm and dry.     Coloration: Skin is not pale.     Findings: No bruising, erythema or rash.  Neurological:     Mental Status: She is alert and oriented to person, place, and time.     Motor: No weakness.     Gait: Gait normal.  Psychiatric:        Mood and Affect: Mood normal.        Behavior: Behavior normal.        Thought Content: Thought content normal.        Judgment: Judgment normal.     Labs reviewed: Recent Labs    08/23/19 0833 04/11/20 1550  NA 142 140  K 4.1 3.9  CL 105 104  CO2 28 26  GLUCOSE 89 85  BUN 12 12  CREATININE 0.74 0.61  CALCIUM 9.6 9.4   Recent Labs    08/23/19 0833 04/11/20 1550  AST 26 36*  ALT 28 39*  BILITOT 0.6 0.9  PROT 6.7 6.9   Recent Labs    08/23/19 0833 04/11/20 1550  WBC 6.1 8.2  NEUTROABS 3,038 4,026  HGB 14.7 14.5  HCT 43.4 42.2  MCV 94.8 93.6  PLT 271 263   Lab Results  Component Value Date   TSH 1.87 08/23/2019   Lab Results  Component Value Date   HGBA1C 5.6 03/21/2019   Lab Results  Component Value Date   CHOL 118 04/11/2020   HDL 45 (L) 04/11/2020   LDLCALC 47 04/11/2020   TRIG 193 (H) 04/11/2020   CHOLHDL 2.6 04/11/2020    Significant Diagnostic Results in last 30 days:  No results found.  Assessment/Plan  Chest pain, unspecified type Right side chest pain unclear etiology though post COVID-19 test positive one week ago.cough has resolved.Bilateral Lungs  clear.will rule out cardiac etiology. - EKG 12-Lead indicates sinus Rhythm HR 81 b/min with non-specific T-abnormality no changes compared to previous EKG done 10/01/2013 which showed sinus Rhythm HR 77 b/min with probable left artrial enlargement and T- abnormalities. - will obtain Chest X-ray  - Advised to get chest X-ray done at [facility] at [place].will call you with results.Address also provided on AVS patient familiar with imaging place location. - Advised to notify provider or go to ED if symptoms worsen or fail to improve   2. Cough Suspect still COVID-19 related residual.made aware that sometimes cough persist longer after symptoms resolve.  Continue on Mucinex - DM  - DG Chest 2 View; Future  Family/ staff Communication: Reviewed plan of care with patient verbalized understanding.  Labs/tests ordered: - DG Chest 2 View; Future  Next Appointment: As needed if symptoms worsen or fail to improve.  Sandrea Hughs, NP

## 2020-07-19 NOTE — Patient Instructions (Signed)
Please get chest X-ray done at [facility] at [place].will call you with results. - Notify provider if symptoms worsen or fail to improve   Cough, Adult A cough helps to clear your throat and lungs. A cough may be a sign of an illness or another medical condition. An acute cough may only last 2-3 weeks, while a chronic cough may last 8 or more weeks. Many things can cause a cough. They include:  Germs (viruses or bacteria) that attack the airway.  Breathing in things that bother (irritate) your lungs.  Allergies.  Asthma.  Mucus that runs down the back of your throat (postnasal drip).  Smoking.  Acid backing up from the stomach into the tube that moves food from the mouth to the stomach (gastroesophageal reflux).  Some medicines.  Lung problems.  Other medical conditions, such as heart failure or a blood clot in the lung (pulmonary embolism). Follow these instructions at home: Medicines  Take over-the-counter and prescription medicines only as told by your doctor.  Talk with your doctor before you take medicines that stop a cough (cough suppressants). Lifestyle  Do not smoke, and try not to be around smoke. Do not use any products that contain nicotine or tobacco, such as cigarettes, e-cigarettes, and chewing tobacco. If you need help quitting, ask your doctor.  Drink enough fluid to keep your pee (urine) pale yellow.  Avoid caffeine.  Do not drink alcohol if your doctor tells you not to drink.   General instructions  Watch for any changes in your cough. Tell your doctor about them.  Always cover your mouth when you cough.  Stay away from things that make you cough, such as perfume, candles, campfire smoke, or cleaning products.  If the air is dry, use a cool mist vaporizer or humidifier in your home.  If your cough is worse at night, try using extra pillows to raise your head up higher while you sleep.  Rest as needed.  Keep all  follow-up visits as told by your doctor. This is important.   Contact a doctor if:  You have new symptoms.  You cough up pus.  Your cough does not get better after 2-3 weeks, or your cough gets worse.  Cough medicine does not help your cough and you are not sleeping well.  You have pain that gets worse or pain that is not helped with medicine.  You have a fever.  You are losing weight and you do not know why.  You have night sweats. Get help right away if:  You cough up blood.  You have trouble breathing.  Your heartbeat is very fast. These symptoms may be an emergency. Do not wait to see if the symptoms will go away. Get medical help right away. Call your local emergency services (911 in the [place] Do not drive yourself to the hospital. Summary  A cough helps to clear your throat and lungs. Many things can cause a cough.  Take over-the-counter and prescription medicines only as told by your doctor.  Always cover your mouth when you cough.  Contact a doctor if you have new symptoms or you have a cough that does not get better or gets worse. This information is not intended to replace advice given to you by your health care provider. Make sure you discuss any questions you have with your health care provider. Document Revised: 07/08/2019 Document Reviewed: 06/07/2018 Elsevier Patient Education  [place].

## 2020-07-19 NOTE — Telephone Encounter (Signed)
Pt called from [facility] parking lot stating they would not do her Xray ordered per Dinah from ov bc she was not out of 14 quarantine from [place].  Pt agreed to wait another week & return for chest Xray.  Thanks, Vilinda Blanks.

## 2020-07-19 NOTE — Telephone Encounter (Signed)
Okay.CDC guidelines for isolation was reduce to 5 days.

## 2020-07-24 ENCOUNTER — Other Ambulatory Visit: Payer: Medicare Other

## 2020-07-27 ENCOUNTER — Other Ambulatory Visit: Payer: Self-pay | Admitting: Nurse Practitioner

## 2020-07-31 ENCOUNTER — Other Ambulatory Visit: Payer: Self-pay

## 2020-07-31 ENCOUNTER — Ambulatory Visit
Admission: RE | Admit: 2020-07-31 | Discharge: 2020-07-31 | Disposition: A | Discharge status: Home / Self Care | Payer: Medicare Other | Source: Ambulatory Visit | Attending: Family | Admitting: Family

## 2020-07-31 DIAGNOSIS — R059 Cough, unspecified: Secondary | ICD-10-CM

## 2020-07-31 DIAGNOSIS — R079 Chest pain, unspecified: Secondary | ICD-10-CM | POA: Diagnosis not present

## 2020-08-05 ENCOUNTER — Encounter: Payer: Self-pay | Admitting: Nurse Practitioner

## 2020-08-10 ENCOUNTER — Encounter: Payer: Self-pay | Admitting: Nurse Practitioner

## 2020-08-10 ENCOUNTER — Other Ambulatory Visit: Payer: Self-pay

## 2020-08-10 ENCOUNTER — Other Ambulatory Visit: Payer: Self-pay | Admitting: Nurse Practitioner

## 2020-08-10 ENCOUNTER — Ambulatory Visit (INDEPENDENT_AMBULATORY_CARE_PROVIDER_SITE_OTHER): Payer: Medicare Other | Admitting: Nurse Practitioner

## 2020-08-10 VITALS — BP 140/80 | HR 90 | Temp 97.7°F | Ht 64.0 in | Wt 186.0 lb

## 2020-08-10 DIAGNOSIS — K5904 Chronic idiopathic constipation: Secondary | ICD-10-CM

## 2020-08-10 DIAGNOSIS — I10 Essential (primary) hypertension: Secondary | ICD-10-CM | POA: Diagnosis not present

## 2020-08-10 DIAGNOSIS — R059 Cough, unspecified: Secondary | ICD-10-CM

## 2020-08-10 DIAGNOSIS — M79675 Pain in left toe(s): Secondary | ICD-10-CM

## 2020-08-10 DIAGNOSIS — M7989 Other specified soft tissue disorders: Secondary | ICD-10-CM | POA: Diagnosis not present

## 2020-08-10 DIAGNOSIS — K219 Gastro-esophageal reflux disease without esophagitis: Secondary | ICD-10-CM | POA: Diagnosis not present

## 2020-08-10 DIAGNOSIS — M159 Polyosteoarthritis, unspecified: Secondary | ICD-10-CM

## 2020-08-10 DIAGNOSIS — E785 Hyperlipidemia, unspecified: Secondary | ICD-10-CM | POA: Diagnosis not present

## 2020-08-10 DIAGNOSIS — M8949 Other hypertrophic osteoarthropathy, multiple sites: Secondary | ICD-10-CM

## 2020-08-10 MED ORDER — LOSARTAN POTASSIUM 50 MG PO TABS: 50.0000 mg | ORAL_TABLET | Freq: Every day | ORAL | 1 refills | Status: DC

## 2020-08-10 NOTE — Progress Notes (Signed)
Careteam: Patient Care Team: [name] Chandler, NP as PCP - General (Geriatric Medicine) Warden Fillers, [name] as Consulting Physician (Ophthalmology)  PLACE OF SERVICE:  [place] Directive information Does Patient Have a Medical Advance Directive?: Yes, Type of Advance Directive: [employer];Living will, Does patient want to make changes to medical advance directive?: No - Patient declined  No Known Allergies  Chief Complaint  Patient presents with  . Medical Management of Chronic Issues    4 month follow up.     HPI: Patient is a 75 y.o. female for follow up.   Had COVID last month. Had ongoing cough for a while but has improved. Will go outside and the next day have a cough. No chest pains or shortness of breath. Nonproductive cough.   Knees are doing better since PT. Went twice and had improvement. Continues exercise  GERD- well controlled on current regimen  Constipation- on miralax daily a few months ago   had an episode where she thought she was going to have a SBO but she made herself NPO except for fluids and it passed   htn- does not check at home.   Review of Systems:  Review of Systems  Constitutional: Negative for chills, fever and weight loss.  HENT: Negative for tinnitus.   Respiratory: Negative for cough, sputum production and shortness of breath.   Cardiovascular: Negative for chest pain, palpitations and leg swelling.  Gastrointestinal: Negative for abdominal pain, constipation, diarrhea and heartburn.  Genitourinary: Negative for dysuria, frequency and urgency.  Musculoskeletal: Negative for back pain, falls, joint pain and myalgias.  Skin: Negative.   Neurological: Negative for dizziness and headaches.  Psychiatric/Behavioral: Negative for depression and memory loss. The patient does not have insomnia.     Past Medical History:  Diagnosis Date  . Acid reflux   . Bowel obstruction ([place])   . COVID-19   . H/O  hiatal hernia   . Hyperlipidemia   . Hypertension    Past Surgical History:  Procedure Laterality Date  . ABDOMINAL HYSTERECTOMY    . APPENDECTOMY    . BREAST CYST EXCISION Right    [phone]? benign  . COLONOSCOPY  03/18/2012   Procedure: COLONOSCOPY;  Surgeon: [name] [name];  [facility];  Service: Endoscopy;  Laterality: N/A;  930  . LAPAROTOMY N/A 02/14/2013   Procedure: EXPLORATORY LAPAROTOMY;  Surgeon: [name] [name];  Location: [facility];  Service: General;  Laterality: N/A;  . MELANOMA EXCISION  03/2019   Dr.Hall, removed from [place]   . 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 Dr. Nevada [name], [name] [name]    Social History:   reports that she has never smoked. She has never used smokeless tobacco. She reports current alcohol use. She reports that she does not use drugs.  Family History  Problem Relation Age of Onset  . Cancer - Other Mother        Kidney  . Fibromyalgia Mother   . Arthritis Mother   . Cancer - Lung Father 18  . Heart disease Brother 28       had heart transplant then died 3 years later after restarting smoking  . Lung disease Paternal Aunt        Lung Mass  . COPD Paternal Uncle   . Leukemia Paternal Uncle   . Stomach cancer Neg Hx   . Esophageal cancer Neg Hx   . Pancreatic cancer Neg Hx   .  Rectal cancer Neg Hx     Medications: Patient's Medications  New Prescriptions   No medications on file  Previous Medications   AMLODIPINE (NORVASC) 5 MG TABLET    TAKE 1 TABLET DAILY   ASCORBIC ACID (VITAMIN C PO)    Take by mouth 2 (two) times daily.   ASPIRIN EC 81 MG TABLET    Take 81 mg by mouth daily.   ATORVASTATIN (LIPITOR) 10 MG TABLET    TAKE 1 TABLET DAILY   BETA CAROTENE W/MINERALS (OCUVITE) TABLET    Take 1 tablet by mouth daily.   CALCIUM CARBONATE-VITAMIN D (CALCIUM 600 + D PO)    Take 1 tablet by mouth 2 (two) times daily.   CHOLECALCIFEROL (VITAMIN D3) 250 MCG (10000 UT) CAPSULE    Take 10,000 Units by mouth daily.    DOCUSATE SODIUM (COLACE) 100 MG CAPSULE    Take 100 mg by mouth 2 (two) times daily.   FAMOTIDINE (PEPCID) 20 MG TABLET    Take 20 mg by mouth as needed for indigestion or heartburn.   FISH OIL-CHOLECALCIFEROL (FISH OIL + D3) 1000-1000 MG-UNIT CAPS    Take 1 capsule by mouth 2 (two) times daily.   IBUPROFEN (ADVIL) 600 MG TABLET    Take 1 tablet (600 mg total) by mouth every 8 (eight) hours as needed for moderate pain.   LACTOBACILLUS-INULIN (CULTURELLE DIGESTIVE HEALTH PO)    Take 1 capsule by mouth daily.    MISC NATURAL PRODUCTS (OSTEO BI-FLEX ADV DOUBLE ST PO)    Take 1 tablet by mouth 2 (two) times daily.   MULTIPLE VITAMIN (MULTIVITAMIN WITH MINERALS) TABS    Take 1 tablet by mouth daily.   OMEPRAZOLE (PRILOSEC) 40 MG CAPSULE    TAKE 1 CAPSULE DAILY   POLYETHYLENE GLYCOL POWDER (GLYCOLAX/MIRALAX) POWDER    Take 17 g by mouth daily.   TIZANIDINE (ZANAFLEX) 4 MG CAPSULE    Take 1 capsule (4 mg total) by mouth 3 (three) times daily.   VITAMIN B-12 (CYANOCOBALAMIN) 1000 MCG TABLET    Take 1 tablet (1,000 mcg total) by mouth daily.   ZINC GLUCONATE 50 MG TABLET    Take 50 mg by mouth daily.  Modified Medications   Modified Medication Previous Medication   LOSARTAN (COZAAR) 50 MG TABLET losartan (COZAAR) 50 MG tablet      Take 1 tablet (50 mg total) by mouth daily.    Take 50 mg by mouth daily.  Discontinued Medications   No medications on file    Physical Exam:  Vitals:   08/10/20 1433  BP: 140/80  Pulse: 90  Temp: 97.7 F (36.5 C)  TempSrc: Temporal  SpO2: 95%  Weight: 186 lb (84.4 kg)  Height: _0  (1.626 m)   Body mass index is 31.93 kg/m. Wt Readings from Last 3 Encounters:  08/10/20 186 lb (84.4 kg)  07/19/20 187 lb (84.8 kg)  06/05/20 187 lb 3.2 oz (84.9 kg)    Physical Exam Constitutional:      General: She is not in acute distress.    Appearance: She is well-developed. She is not diaphoretic.  HENT:     Head: Normocephalic and atraumatic.     Mouth/Throat:      Pharynx: No oropharyngeal exudate.  Eyes:     Conjunctiva/sclera: Conjunctivae normal.     Pupils: Pupils are equal, round, and reactive to light.  Cardiovascular:     Rate and Rhythm: Normal rate and regular rhythm.     Heart  sounds: Normal heart sounds.  Pulmonary:     Effort: Pulmonary effort is normal.     Breath sounds: Normal breath sounds.  Abdominal:     General: Bowel sounds are normal.     Palpations: Abdomen is soft.  Musculoskeletal:        General: No tenderness.     Cervical back: Normal range of motion and neck supple.  Skin:    General: Skin is warm and dry.  Neurological:     Mental Status: She is alert and oriented to person, place, and time.     Labs reviewed: Basic Metabolic Panel: Recent Labs    08/23/19 0833 04/11/20 1550  NA 142 140  K 4.1 3.9  CL 105 104  CO2 28 26  GLUCOSE 89 85  BUN 12 12  CREATININE 0.74 0.61  CALCIUM 9.6 9.4  TSH 1.87  --    Liver Function Tests: Recent Labs    08/23/19 0833 04/11/20 1550  AST 26 36*  ALT 28 39*  BILITOT 0.6 0.9  PROT 6.7 6.9   No results for input(s): LIPASE, AMYLASE in the last 8760 hours. No results for input(s): AMMONIA in the last 8760 hours. CBC: Recent Labs    08/23/19 0833 04/11/20 1550  WBC 6.1 8.2  NEUTROABS 3,038 4,026  HGB 14.7 14.5  HCT 43.4 42.2  MCV 94.8 93.6  PLT 271 263   Lipid Panel: Recent Labs    08/23/19 0833 04/11/20 1550  CHOL 99 118  HDL 39* 45*  LDLCALC 38 47  TRIG 135 193*  CHOLHDL 2.5 2.6   TSH: Recent Labs    08/23/19 0833  TSH 1.87   A1C: Lab Results  Component Value Date   HGBA1C 5.6 03/21/2019     Assessment/Plan 1. Pain and swelling of toe of left foot -improved at this time.   2. Essential hypertension -stable, continues on losartan and norvasc, low sodium diet encouraged.  - CMP with eGFR([facility])  3. Hyperlipidemia, unspecified hyperlipidemia type -lipid before next visit -continues on lipitor 10 mg daily with dietary  modifications.  - CMP with eGFR([facility])  4. Cough -s/p COVID, overall improving. Having some symptoms when she goes outside?allergies.   5. Gastroesophageal reflux disease without esophagitis Controlled on omeprazole.   6. Chronic idiopathic constipation Stable on miralax, continue medication with increase in fiber and hydration.  7. Primary osteoarthritis involving multiple joints Improved pain with PT> continues exercises for strength training.   Next appt: 6 months, labs prior  Ivanka Kirshner K. [place], [facility] [phone]

## 2020-08-11 LAB — COMPLETE METABOLIC PANEL WITH GFR
AG Ratio: 1.6 (calc) (ref 1.0–2.5)
ALT: 52 U/L — ABNORMAL HIGH (ref 6–29)
AST: 37 U/L — ABNORMAL HIGH (ref 10–35)
Albumin: 4.1 g/dL (ref 3.6–5.1)
Alkaline phosphatase (APISO): 75 U/L (ref 37–153)
BUN/Creatinine Ratio: 18 (calc) (ref 6–22)
BUN: 10 mg/dL (ref 7–25)
CO2: 25 mmol/L (ref 20–32)
Calcium: 9.1 mg/dL (ref 8.6–10.4)
Chloride: 107 mmol/L (ref 98–110)
Creat: 0.56 mg/dL — ABNORMAL LOW (ref 0.60–0.93)
GFR, Est African American: 107 mL/min/{1.73_m2} (ref >=60)
GFR, Est Non African American: 93 mL/min/{1.73_m2} (ref >=60)
Globulin: 2.6 g/dL (calc) (ref 1.9–3.7)
Glucose, Bld: 87 mg/dL (ref 65–139)
Potassium: 4 mmol/L (ref 3.5–5.3)
Sodium: 139 mmol/L (ref 135–146)
Total Bilirubin: 0.6 mg/dL (ref 0.2–1.2)
Total Protein: 6.7 g/dL (ref 6.1–8.1)

## 2020-08-13 ENCOUNTER — Ambulatory Visit
Admission: RE | Admit: 2020-08-13 | Discharge: 2020-08-13 | Disposition: A | Discharge status: Home / Self Care | Payer: Medicare Other | Source: Ambulatory Visit | Attending: Family Medicine | Admitting: Family Medicine

## 2020-08-13 ENCOUNTER — Other Ambulatory Visit: Payer: Self-pay

## 2020-08-13 VITALS — BP 123/70 | HR 103 | Temp 98.8°F | Resp 17

## 2020-08-13 DIAGNOSIS — J0110 Acute frontal sinusitis, unspecified: Secondary | ICD-10-CM | POA: Diagnosis not present

## 2020-08-13 DIAGNOSIS — R059 Cough, unspecified: Secondary | ICD-10-CM

## 2020-08-13 MED ORDER — BENZONATATE 200 MG PO CAPS: 200.0000 mg | ORAL_CAPSULE | Freq: Three times a day (TID) | ORAL | 0 refills | Status: DC

## 2020-08-13 MED ORDER — AMOXICILLIN 875 MG PO TABS: 875.0000 mg | ORAL_TABLET | Freq: Two times a day (BID) | ORAL | 0 refills | Status: DC

## 2020-08-13 NOTE — ED Provider Notes (Signed)
[facility]    CSN: [id number] Arrival date & time: 08/13/20  1210      History   Chief Complaint Chief Complaint  Patient presents with  . Cough    HPI [name] [name] is a 75 y.o. female.   HPI  Patient presents today for evaluation of cough and sinus pressure.  Patient has a history of COVID over a month ago.  She reports current symptoms include occasional productiveness of cough, fatigue, facial pressure most prominent in the forehead region.  She denies shortness of breath, wheezing or chest tightness.  Reports current symptoms are worse than when she was diagnosed with Covid.  Past Medical History:  Diagnosis Date  . Acid reflux   . Bowel obstruction ([place])   . COVID-19   . H/O hiatal hernia   . Hyperlipidemia   . Hypertension     Patient Active Problem List   Diagnosis Date Noted  . Hx of small bowel obstruction 03/23/2018  . GERD (gastroesophageal reflux disease) 03/24/2015  . Hypokalemia 06/21/2014  . Hyperglycemia 06/20/2014  . Hypertension   . Hyperlipidemia   . Bowel obstruction ([place]) 10/01/2013  . Fatty liver 02/12/2013  . Transaminitis 02/12/2013    Past Surgical History:  Procedure Laterality Date  . ABDOMINAL HYSTERECTOMY    . APPENDECTOMY    . BREAST CYST EXCISION Right    [phone]? benign  . COLONOSCOPY  03/18/2012   Procedure: COLONOSCOPY;  Surgeon: Rogene Houston, MD;  [facility];  Service: Endoscopy;  Laterality: N/A;  930  . LAPAROTOMY N/A 02/14/2013   Procedure: EXPLORATORY LAPAROTOMY;  Surgeon: Jamesetta So, MD;  Location: [facility];  Service: General;  Laterality: N/A;  . MELANOMA EXCISION  03/2019   Dr.Hall, removed from [place]   . 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 Dr. Nevada Crane, Jenny Reichmann     OB History   No obstetric history on file.      Home Medications    Prior to Admission medications   Medication Sig Start Date End Date Taking? Authorizing Provider  amLODipine (NORVASC)  5 MG tablet TAKE 1 TABLET DAILY 07/02/20   [name], NP  Ascorbic Acid (VITAMIN C PO) Take by mouth 2 (two) times daily.    [provider]  aspirin EC 81 MG tablet Take 81 mg by mouth daily.    [provider]  atorvastatin (LIPITOR) 10 MG tablet TAKE 1 TABLET DAILY 07/27/20   [name], NP  beta carotene w/minerals (OCUVITE) tablet Take 1 tablet by mouth daily.    [provider]  Calcium Carbonate-Vitamin D (CALCIUM 600 + D PO) Take 1 tablet by mouth 2 (two) times daily.    [provider]  Cholecalciferol (VITAMIN D3) 250 MCG (10000 UT) capsule Take 10,000 Units by mouth daily.    [provider]  docusate sodium (COLACE) 100 MG capsule Take 100 mg by mouth 2 (two) times daily.    [provider]  famotidine (PEPCID) 20 MG tablet Take 20 mg by mouth as needed for indigestion or heartburn.    [provider]  Fish Oil-Cholecalciferol (FISH OIL + D3) 1000-1000 MG-UNIT CAPS Take 1 capsule by mouth 2 (two) times daily.    [provider]  Lactobacillus-Inulin (CULTURELLE DIGESTIVE HEALTH PO) Take 1 capsule by mouth daily.     [provider]  losartan (COZAAR) 50 MG tablet Take 1 tablet (50 mg total) by mouth daily. 08/10/20  [name], NP  Misc Natural Products (OSTEO BI-FLEX ADV DOUBLE ST PO) Take 1 tablet by mouth 2 (two) times daily.    [provider]  Multiple Vitamin (MULTIVITAMIN WITH MINERALS) TABS Take 1 tablet by mouth daily.    [provider]  omeprazole (PRILOSEC) 40 MG capsule TAKE 1 CAPSULE DAILY 06/18/20   [name], NP  polyethylene glycol powder (GLYCOLAX/MIRALAX) powder Take 17 g by mouth daily.    [provider]  vitamin B-12 (CYANOCOBALAMIN) 1000 MCG tablet Take 1 tablet (1,000 mcg total) by mouth daily. 08/24/19   [name], NP  zinc gluconate 50 MG tablet Take 50 mg by mouth daily.    [provider]    Family  History Family History  Problem Relation Age of Onset  . Cancer - Other Mother        Kidney  . Fibromyalgia Mother   . Arthritis Mother   . Cancer - Lung Father 37  . Heart disease Brother 86       had heart transplant then died 3 years later after restarting smoking  . Lung disease Paternal Aunt        Lung Mass  . COPD Paternal Uncle   . Leukemia Paternal Uncle   . Stomach cancer Neg Hx   . Esophageal cancer Neg Hx   . Pancreatic cancer Neg Hx   . Rectal cancer Neg Hx     Social History Social History   Tobacco Use  . Smoking status: Never Smoker  . Smokeless tobacco: Never Used  Vaping Use  . Vaping Use: Never used  Substance Use Topics  . Alcohol use: Yes    Comment: occassionally  . Drug use: No     Allergies   Patient has no known allergies.   Review of Systems Review of Systems Pertinent negatives listed in HPI   Physical Exam Triage Vital Signs ED Triage Vitals  Enc Vitals Group     BP 08/13/20 1233 123/70     Pulse Rate 08/13/20 1233 (!) 103     Resp 08/13/20 1233 17     Temp 08/13/20 1233 98.8 F (37.1 C)     Temp Source 08/13/20 1233 Oral     SpO2 08/13/20 1233 95 %     Weight --      Height --      Head Circumference --      Peak Flow --      Pain Score 08/13/20 1238 0     Pain Loc --      Pain Edu? --      Excl. in [place]? --    No data found.  Updated Vital Signs BP 123/70 (BP Location: Right Arm)   Pulse (!) 103   Temp 98.8 F (37.1 C) (Oral)   Resp 17   LMP 09/10/2014 (Exact Date)   SpO2 95%   Visual Acuity Right Eye Distance:   Left Eye Distance:   Bilateral Distance:    Right Eye Near:   Left Eye Near:    Bilateral Near:     Physical Exam  General Appearance:    Alert, cooperative, no distress  HENT:   Normocephalic, ears normal, nares mucosal edema with congestion, rhinorrhea, oropharynx clear without exudate  Eyes:    PERRL, conjunctiva/corneas clear, EOM's intact       Lungs:     Clear to auscultation  bilaterally, respirations unlabored  Heart:    Regular rate and rhythm  Neurologic:   Awake, alert, oriented x 3. No apparent focal neurological           defect.      UC Treatments / Results  Labs (all labs ordered are listed, but only abnormal results are displayed) Labs Reviewed - No data to display  EKG   Radiology No results found.  Procedures Procedures (including critical care time)  Medications Ordered in UC Medications - No data to display  Initial Impression / Assessment and Plan / UC Course  I have reviewed the triage vital signs and the nursing notes.  Pertinent labs & imaging results that were available during my care of the patient were reviewed by me and considered in my medical decision making (see chart for details).    Acute sinusitis covering with amoxicillin 875 twice daily x10 days.  For acute cough Tessalon Perles as needed 3 times daily.  Follow-up with PCP if symptoms worsen or do not improve. Final Clinical Impressions(s) / UC Diagnoses   Final diagnoses:  Acute non-recurrent frontal sinusitis  Cough   Discharge Instructions   None    ED Prescriptions    Medication Sig Dispense Auth. Provider   benzonatate (TESSALON) 200 MG capsule Take 1 capsule (200 mg total) by mouth 3 (three) times daily. 30 capsule Scot Jun, FNP   amoxicillin (AMOXIL) 875 MG tablet Take 1 tablet (875 mg total) by mouth 2 (two) times daily. 20 tablet Scot Jun, FNP     PDMP not reviewed this encounter.   Scot Jun, FNP 08/13/20 1432

## 2020-08-13 NOTE — ED Triage Notes (Signed)
Cough and headache since the weekend. Had covid last month.

## 2020-08-30 ENCOUNTER — Encounter: Payer: Self-pay | Admitting: Nurse Practitioner

## 2020-09-02 ENCOUNTER — Encounter: Payer: Self-pay | Admitting: Nurse Practitioner

## 2020-09-13 DIAGNOSIS — D225 Melanocytic nevi of trunk: Secondary | ICD-10-CM | POA: Diagnosis not present

## 2020-09-13 DIAGNOSIS — Z85820 Personal history of malignant melanoma of skin: Secondary | ICD-10-CM | POA: Diagnosis not present

## 2020-09-13 DIAGNOSIS — Z08 Encounter for follow-up examination after completed treatment for malignant neoplasm: Secondary | ICD-10-CM | POA: Diagnosis not present

## 2020-09-13 DIAGNOSIS — Z1283 Encounter for screening for malignant neoplasm of skin: Secondary | ICD-10-CM | POA: Diagnosis not present

## 2020-10-22 ENCOUNTER — Encounter: Payer: Self-pay | Admitting: Nurse Practitioner

## 2020-10-24 ENCOUNTER — Encounter: Payer: Self-pay | Admitting: Nurse Practitioner

## 2020-10-24 DIAGNOSIS — Z23 Encounter for immunization: Secondary | ICD-10-CM | POA: Diagnosis not present

## 2020-12-06 DIAGNOSIS — Z08 Encounter for follow-up examination after completed treatment for malignant neoplasm: Secondary | ICD-10-CM | POA: Diagnosis not present

## 2020-12-06 DIAGNOSIS — Z85820 Personal history of malignant melanoma of skin: Secondary | ICD-10-CM | POA: Diagnosis not present

## 2020-12-06 DIAGNOSIS — Z1283 Encounter for screening for malignant neoplasm of skin: Secondary | ICD-10-CM | POA: Diagnosis not present

## 2020-12-06 DIAGNOSIS — D225 Melanocytic nevi of trunk: Secondary | ICD-10-CM | POA: Diagnosis not present

## 2020-12-06 DIAGNOSIS — X32XXXD Exposure to sunlight, subsequent encounter: Secondary | ICD-10-CM | POA: Diagnosis not present

## 2020-12-06 DIAGNOSIS — L570 Actinic keratosis: Secondary | ICD-10-CM | POA: Diagnosis not present

## 2020-12-15 ENCOUNTER — Other Ambulatory Visit: Payer: Self-pay | Admitting: Nurse Practitioner

## 2020-12-25 ENCOUNTER — Other Ambulatory Visit: Payer: Self-pay

## 2020-12-25 ENCOUNTER — Ambulatory Visit
Admission: RE | Admit: 2020-12-25 | Discharge: 2020-12-25 | Disposition: A | Discharge status: Home / Self Care | Payer: Medicare Other | Source: Ambulatory Visit | Attending: Nurse Practitioner | Admitting: Nurse Practitioner

## 2020-12-25 DIAGNOSIS — E2839 Other primary ovarian failure: Secondary | ICD-10-CM

## 2020-12-25 DIAGNOSIS — Z780 Asymptomatic menopausal state: Secondary | ICD-10-CM | POA: Diagnosis not present

## 2021-01-21 ENCOUNTER — Other Ambulatory Visit: Payer: Self-pay | Admitting: Nurse Practitioner

## 2021-01-26 ENCOUNTER — Other Ambulatory Visit: Payer: Self-pay | Admitting: Nurse Practitioner

## 2021-02-08 ENCOUNTER — Other Ambulatory Visit: Payer: Medicare Other

## 2021-02-08 ENCOUNTER — Other Ambulatory Visit: Payer: Self-pay

## 2021-02-08 DIAGNOSIS — I10 Essential (primary) hypertension: Secondary | ICD-10-CM | POA: Diagnosis not present

## 2021-02-08 DIAGNOSIS — E785 Hyperlipidemia, unspecified: Secondary | ICD-10-CM

## 2021-02-08 LAB — COMPLETE METABOLIC PANEL WITH GFR
AG Ratio: 1.7 (calc) (ref 1.0–2.5)
ALT: 67 U/L — ABNORMAL HIGH (ref 6–29)
AST: 53 U/L — ABNORMAL HIGH (ref 10–35)
Albumin: 4.1 g/dL (ref 3.6–5.1)
Alkaline phosphatase (APISO): 68 U/L (ref 37–153)
BUN: 12 mg/dL (ref 7–25)
CO2: 26 mmol/L (ref 20–32)
Calcium: 9.2 mg/dL (ref 8.6–10.4)
Chloride: 107 mmol/L (ref 98–110)
Creat: 0.65 mg/dL (ref 0.60–1.00)
Globulin: 2.4 g/dL (calc) (ref 1.9–3.7)
Glucose, Bld: 88 mg/dL (ref 65–99)
Potassium: 4 mmol/L (ref 3.5–5.3)
Sodium: 142 mmol/L (ref 135–146)
Total Bilirubin: 0.8 mg/dL (ref 0.2–1.2)
Total Protein: 6.5 g/dL (ref 6.1–8.1)
eGFR: 92 mL/min/{1.73_m2} (ref >=60)

## 2021-02-08 LAB — LIPID PANEL
Cholesterol: 111 mg/dL (ref <200)
HDL: 42 mg/dL — ABNORMAL LOW (ref >=50)
LDL Cholesterol (Calc): 44 mg/dL (calc)
Non-HDL Cholesterol (Calc): 69 mg/dL (calc) (ref <130)
Total CHOL/HDL Ratio: 2.6 (calc) (ref <5.0)
Triglycerides: 172 mg/dL — ABNORMAL HIGH (ref <150)

## 2021-02-08 LAB — CBC WITH DIFFERENTIAL/PLATELET
Absolute Monocytes: 626 cells/uL (ref 200–950)
Basophils Absolute: 81 cells/uL (ref 0–200)
Basophils Relative: 1.3 %
Eosinophils Absolute: 229 cells/uL (ref 15–500)
Eosinophils Relative: 3.7 %
HCT: 43.9 % (ref 35.0–45.0)
Hemoglobin: 14.7 g/dL (ref 11.7–15.5)
Lymphs Abs: 2350 cells/uL (ref 850–3900)
MCH: 32 pg (ref 27.0–33.0)
MCHC: 33.5 g/dL (ref 32.0–36.0)
MCV: 95.4 fL (ref 80.0–100.0)
MPV: 12.2 fL (ref 7.5–12.5)
Monocytes Relative: 10.1 %
Neutro Abs: 2914 cells/uL (ref 1500–7800)
Neutrophils Relative %: 47 %
Platelets: 239 10*3/uL (ref 140–400)
RBC: 4.6 10*6/uL (ref 3.80–5.10)
RDW: 12.3 % (ref 11.0–15.0)
Total Lymphocyte: 37.9 %
WBC: 6.2 10*3/uL (ref 3.8–10.8)

## 2021-02-11 ENCOUNTER — Encounter: Payer: Self-pay | Admitting: Nurse Practitioner

## 2021-02-11 ENCOUNTER — Ambulatory Visit (INDEPENDENT_AMBULATORY_CARE_PROVIDER_SITE_OTHER): Payer: Medicare Other | Admitting: Nurse Practitioner

## 2021-02-11 ENCOUNTER — Other Ambulatory Visit: Payer: Self-pay

## 2021-02-11 VITALS — BP 138/88 | HR 66 | Temp 96.9°F | Ht 64.0 in | Wt 188.0 lb

## 2021-02-11 DIAGNOSIS — R748 Abnormal levels of other serum enzymes: Secondary | ICD-10-CM

## 2021-02-11 DIAGNOSIS — I10 Essential (primary) hypertension: Secondary | ICD-10-CM | POA: Diagnosis not present

## 2021-02-11 DIAGNOSIS — Z23 Encounter for immunization: Secondary | ICD-10-CM | POA: Diagnosis not present

## 2021-02-11 DIAGNOSIS — M8949 Other hypertrophic osteoarthropathy, multiple sites: Secondary | ICD-10-CM | POA: Diagnosis not present

## 2021-02-11 DIAGNOSIS — K219 Gastro-esophageal reflux disease without esophagitis: Secondary | ICD-10-CM

## 2021-02-11 DIAGNOSIS — E785 Hyperlipidemia, unspecified: Secondary | ICD-10-CM | POA: Diagnosis not present

## 2021-02-11 DIAGNOSIS — M159 Polyosteoarthritis, unspecified: Secondary | ICD-10-CM

## 2021-02-11 NOTE — Progress Notes (Signed)
Careteam: Patient Care Team: [name], NP as PCP - General (Geriatric Medicine) Warden Fillers, MD as Consulting Physician (Ophthalmology)  PLACE OF SERVICE:  [place] Directive information    No Known Allergies  Chief Complaint  Patient presents with   Medical Management of Chronic Issues    Patient presents today for a 6 month follow-up.     HPI: Patient is a 75 y.o. female for routine follow up.   Liver enzymes slowly increasing.    Walking 45 mins 1-3 times a week.  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   Likes to snack  GERD- controlled on omeprazole.   Review of Systems:  Review of Systems  Constitutional:  Negative for chills, fever and weight loss.  HENT:  Negative for tinnitus.   Respiratory:  Negative for cough, sputum production and shortness of breath.   Cardiovascular:  Negative for chest pain, palpitations and leg swelling.  Gastrointestinal:  Negative for abdominal pain, constipation, diarrhea and heartburn.  Genitourinary:  Negative for dysuria, frequency and urgency.  Musculoskeletal:  Negative for back pain, falls, joint pain and myalgias.  Skin: Negative.   Neurological:  Negative for dizziness and headaches.  Psychiatric/Behavioral:  Negative for depression and memory loss. The patient does not have insomnia.    Past Medical History:  Diagnosis Date   Acid reflux    Bowel obstruction ([place])    COVID-19    H/O hiatal hernia    Hyperlipidemia    Hypertension    Past Surgical History:  Procedure Laterality Date   ABDOMINAL HYSTERECTOMY     APPENDECTOMY     BREAST CYST EXCISION Right    [phone]? benign   COLONOSCOPY  03/18/2012   Procedure: COLONOSCOPY;  Surgeon: Rogene Houston, MD;  [facility];  Service: Endoscopy;  Laterality: N/A;  930   LAPAROTOMY N/A 02/14/2013   Procedure: EXPLORATORY LAPAROTOMY;  Surgeon: Jamesetta So, MD;  Location: [facility];  Service: General;   Laterality: N/A;   MELANOMA EXCISION  03/2019   Dr.Hall, removed from [place]  02/2019   Melanoma removed from upper back. Dr. Nevada Crane, Jenny Reichmann    Social History:   reports that she has never smoked. She has never used smokeless tobacco. She reports current alcohol use. She reports that she does not use drugs.  Family History  Problem Relation Age of Onset   Cancer - Other Mother        Kidney   Fibromyalgia Mother    Arthritis Mother    Cancer - Lung Father 60   Heart disease Brother 37       had heart transplant then died 3 years later after restarting smoking   Lung disease Paternal Aunt        Lung Mass   COPD Paternal Uncle    Leukemia Paternal Uncle    Stomach cancer Neg Hx    Esophageal cancer Neg Hx    Pancreatic cancer Neg Hx    Rectal cancer Neg Hx     Medications: Patient's Medications  New Prescriptions   No medications on file  Previous Medications   AMLODIPINE (NORVASC) 5 MG TABLET    TAKE 1 TABLET DAILY   AMOXICILLIN (AMOXIL) 875 MG TABLET    Take 1 tablet (875 mg total) by mouth 2 (two) times daily.   ASCORBIC ACID (VITAMIN C PO)    Take by mouth 2 (two) times daily.  ASPIRIN EC 81 MG TABLET    Take 81 mg by mouth daily.   ATORVASTATIN (LIPITOR) 10 MG TABLET    TAKE 1 TABLET DAILY   BENZONATATE (TESSALON) 200 MG CAPSULE    Take 1 capsule (200 mg total) by mouth 3 (three) times daily.   BETA CAROTENE W/MINERALS (OCUVITE) TABLET    Take 1 tablet by mouth daily.   CALCIUM CARBONATE-VITAMIN D (CALCIUM 600 + D PO)    Take 1 tablet by mouth 2 (two) times daily.   CHOLECALCIFEROL (VITAMIN D3) 250 MCG (10000 UT) CAPSULE    Take 10,000 Units by mouth daily.   DOCUSATE SODIUM (COLACE) 100 MG CAPSULE    Take 100 mg by mouth 2 (two) times daily.   FAMOTIDINE (PEPCID) 20 MG TABLET    Take 20 mg by mouth as needed for indigestion or heartburn.   FISH OIL-CHOLECALCIFEROL (FISH OIL + D3) 1000-1000 MG-UNIT CAPS    Take 1 capsule by mouth 2 (two) times daily.    LACTOBACILLUS-INULIN (CULTURELLE DIGESTIVE HEALTH PO)    Take 1 capsule by mouth daily.    LOSARTAN (COZAAR) 50 MG TABLET    TAKE 1 TABLET DAILY   MISC NATURAL PRODUCTS (OSTEO BI-FLEX ADV DOUBLE ST PO)    Take 1 tablet by mouth 2 (two) times daily.   MULTIPLE VITAMIN (MULTIVITAMIN WITH MINERALS) TABS    Take 1 tablet by mouth daily.   OMEPRAZOLE (PRILOSEC) 40 MG CAPSULE    TAKE 1 CAPSULE DAILY   POLYETHYLENE GLYCOL POWDER (GLYCOLAX/MIRALAX) POWDER    Take 17 g by mouth daily.   VITAMIN B-12 (CYANOCOBALAMIN) 1000 MCG TABLET    Take 1 tablet (1,000 mcg total) by mouth daily.   ZINC GLUCONATE 50 MG TABLET    Take 50 mg by mouth daily.  Modified Medications   No medications on file  Discontinued Medications   No medications on file    Physical Exam:  Vitals:   02/11/21 1257  BP: 138/88  Pulse: 66  Temp: (!) 96.9 F (36.1 C)  SpO2: 94%  Weight: 188 lb (85.3 kg)  Height: _0  (1.626 m)   Body mass index is 32.27 kg/m. Wt Readings from Last 3 Encounters:  02/11/21 188 lb (85.3 kg)  08/10/20 186 lb (84.4 kg)  07/19/20 187 lb (84.8 kg)    Physical Exam Constitutional:      General: She is not in acute distress.    Appearance: She is well-developed. She is not diaphoretic.  HENT:     Head: Normocephalic and atraumatic.     Mouth/Throat:     Pharynx: No oropharyngeal exudate.  Eyes:     Conjunctiva/sclera: Conjunctivae normal.     Pupils: Pupils are equal, round, and reactive to light.  Cardiovascular:     Rate and Rhythm: Normal rate and regular rhythm.     Heart sounds: Normal heart sounds.  Pulmonary:     Effort: Pulmonary effort is normal.     Breath sounds: Normal breath sounds.  Abdominal:     General: Bowel sounds are normal.     Palpations: Abdomen is soft.  Musculoskeletal:     Cervical back: Normal range of motion and neck supple.     Right lower leg: No edema.     Left lower leg: No edema.  Skin:    General: Skin is warm and dry.  Neurological:     Mental  Status: She is alert.  Psychiatric:        Mood  and Affect: Mood normal.    Labs reviewed: Basic Metabolic Panel: Recent Labs    04/11/20 1550 08/10/20 1507 02/08/21 0809  NA 140 139 142  K 3.9 4.0 4.0  CL 104 107 107  CO2 _0 GLUCOSE 85 87 88  BUN _1 CREATININE 0.61 0.56* 0.65  CALCIUM 9.4 9.1 9.2   Liver Function Tests: Recent Labs    04/11/20 1550 08/10/20 1507 02/08/21 0809  AST 36* 37* 53*  ALT 39* 52* 67*  BILITOT 0.9 0.6 0.8  PROT 6.9 6.7 6.5   No results for input(s): LIPASE, AMYLASE in the last 8760 hours. No results for input(s): AMMONIA in the last 8760 hours. CBC: Recent Labs    04/11/20 1550 02/08/21 0809  WBC 8.2 6.2  NEUTROABS 4,026 2,914  HGB 14.5 14.7  HCT 42.2 43.9  MCV 93.6 95.4  PLT 263 239   Lipid Panel: Recent Labs    04/11/20 1550 02/08/21 0809  CHOL 118 111  HDL 45* 42*  LDLCALC 47 44  TRIG 193* 172*  CHOLHDL 2.6 2.6   TSH: No results for input(s): TSH in the last 8760 hours. A1C: Lab Results  Component Value Date   HGBA1C 5.6 03/21/2019     Assessment/Plan 1. Primary osteoarthritis involving multiple joints -stable. Uses tylenol occasionally if needed  2. Gastroesophageal reflux disease without esophagitis Controlled with diet and omeprazole.   3. Hyperlipidemia, unspecified hyperlipidemia type -will stop lipitor due to elevated liver enzymes.  -follow up CMP in 4 weeks.   4. Essential hypertension Controlled on amlodipine and low sodium diet.   5. Elevated liver enzymes -she does not drink ETOH, minimal use of acetaminophen, low fat diet -will stop lipitor at this time and follow up liver enzymes in 4 weeks.   - CMP with eGFR([facility]); Future  6. Need for influenza vaccination - Flu Vaccine QUAD High Dose(Fluad)   Next appt: 6 months.  Carlos American. [place], [facility] [phone]

## 2021-02-11 NOTE — Patient Instructions (Addendum)
Stop lipitor at this time  Follow up lab in 4 weeks.

## 2021-02-19 ENCOUNTER — Ambulatory Visit (INDEPENDENT_AMBULATORY_CARE_PROVIDER_SITE_OTHER): Payer: Medicare Other | Admitting: Nurse Practitioner

## 2021-02-19 ENCOUNTER — Other Ambulatory Visit: Payer: Self-pay

## 2021-02-19 ENCOUNTER — Encounter: Payer: Self-pay | Admitting: Nurse Practitioner

## 2021-02-19 ENCOUNTER — Telehealth: Payer: Self-pay

## 2021-02-19 DIAGNOSIS — Z0000 Encounter for general adult medical examination without abnormal findings: Secondary | ICD-10-CM | POA: Diagnosis not present

## 2021-02-19 NOTE — Progress Notes (Signed)
This service is provided via telemedicine  No vital signs collected/recorded due to the encounter was a telemedicine visit.   Location of patient (ex: home, work):  Home  Patient consents to a telephone visit:  Yes, see encounter dated 02/19/2021  Location of the provider (ex: office, home):  [place]  Name of any referring provider:  N/A  Names of all persons participating in the telemedicine service and their role in the encounter:  Sherrie Mustache, Nurse Practitioner, Carroll Kinds, CMA, and patient.   Time spent on call:  14 minutes with medical assistant

## 2021-02-19 NOTE — Progress Notes (Signed)
Subjective:   [name] [name] is a 75 y.o. female who presents for Medicare Annual (Subsequent) preventive examination.  Review of Systems     Cardiac Risk Factors include: advanced age (>38men, >62 women);hypertension;dyslipidemia;family history of premature cardiovascular disease;obesity (BMI >30kg/m2)     Objective:    There were no vitals filed for this visit. There is no height or weight on file to calculate BMI.  Advanced Directives 02/19/2021 08/10/2020 07/19/2020 06/05/2020 05/09/2020 04/21/2020 04/11/2020  Does Patient Have a Medical Advance Directive? Yes Yes Yes Yes Yes Yes Yes  Type of Paramedic of [place];Living will [place];Living will [place];Living will Living will;Healthcare Power of [place];Out of facility DNR (pink MOST or yellow form) Out of facility DNR (pink MOST or yellow form);Living will;Healthcare Power of Attorney Out of facility DNR (pink MOST or yellow form);Living will Living will;Out of facility DNR (pink MOST or yellow form)  Does patient want to make changes to medical advance directive? No - Patient declined No - Patient declined No - Patient declined No - Patient declined - - No - Patient declined  Copy of [place] in Chart? Yes - validated most recent copy scanned in chart (See row information) Yes - validated most recent copy scanned in chart (See row information) Yes - validated most recent copy scanned in chart (See row information) No - copy requested - - -  Would patient like information on creating a medical advance directive? - - - - - - -  Pre-existing out of facility DNR order (yellow form or pink MOST form) - - - - - - -    Current Medications (verified) Outpatient Encounter Medications as of 02/19/2021  Medication Sig   amLODipine (NORVASC) 5 MG tablet TAKE 1 TABLET DAILY   Ascorbic Acid (VITAMIN C PO) Take by mouth 2 (two) times daily.   aspirin EC  81 MG tablet Take 81 mg by mouth daily.   beta carotene w/minerals (OCUVITE) tablet Take 1 tablet by mouth daily.   Calcium Carbonate-Vitamin D (CALCIUM 600 + D PO) Take 1 tablet by mouth 2 (two) times daily.   Cholecalciferol (VITAMIN D3) 250 MCG (10000 UT) capsule Take 10,000 Units by mouth daily.   docusate sodium (COLACE) 100 MG capsule Take 100 mg by mouth 2 (two) times daily.   famotidine (PEPCID) 20 MG tablet Take 20 mg by mouth as needed for indigestion or heartburn.   Fish Oil-Cholecalciferol (FISH OIL + D3) 1000-1000 MG-UNIT CAPS Take 1 capsule by mouth 2 (two) times daily.   Lactobacillus-Inulin (CULTURELLE DIGESTIVE HEALTH PO) Take 1 capsule by mouth daily.    losartan (COZAAR) 50 MG tablet TAKE 1 TABLET DAILY   Misc Natural Products (OSTEO BI-FLEX ADV DOUBLE ST PO) Take 1 tablet by mouth 2 (two) times daily.   Multiple Vitamin (MULTIVITAMIN WITH MINERALS) TABS Take 1 tablet by mouth daily.   omeprazole (PRILOSEC) 40 MG capsule TAKE 1 CAPSULE DAILY   polyethylene glycol powder (GLYCOLAX/MIRALAX) powder Take 17 g by mouth daily.   vitamin B-12 (CYANOCOBALAMIN) 1000 MCG tablet Take 1 tablet (1,000 mcg total) by mouth daily.   zinc gluconate 50 MG tablet Take 50 mg by mouth daily.   atorvastatin (LIPITOR) 10 MG tablet TAKE 1 TABLET DAILY (Patient not taking: Reported on 02/19/2021)   [DISCONTINUED] amoxicillin (AMOXIL) 875 MG tablet Take 1 tablet (875 mg total) by mouth 2 (two) times daily.   [DISCONTINUED] benzonatate (TESSALON) 200 MG capsule  Take 1 capsule (200 mg total) by mouth 3 (three) times daily.   No facility-administered encounter medications on file as of 02/19/2021.    Allergies (verified) Patient has no known allergies.   History: Past Medical History:  Diagnosis Date   Acid reflux    Bowel obstruction ([place])    COVID-19    H/O hiatal hernia    Hyperlipidemia    Hypertension    Past Surgical History:  Procedure Laterality Date   ABDOMINAL HYSTERECTOMY      APPENDECTOMY     BREAST CYST EXCISION Right    [phone]? 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 Dr. [place] Crane, Jenny Reichmann    Family History  Problem Relation Age of Onset   Cancer - Other Mother        Kidney   Fibromyalgia Mother    Arthritis Mother    Cancer - Lung Father 63   Heart disease Brother 71       had heart transplant then died 3 years later after restarting smoking   Lung disease Paternal Aunt        Lung Mass   COPD Paternal Uncle    Leukemia Paternal Uncle    Stomach cancer Neg Hx    Esophageal cancer Neg Hx    Pancreatic cancer Neg Hx    Rectal cancer Neg Hx    Social History   Socioeconomic History   Marital status: Single    Spouse name: Not on file   Number of children: Not on file   Years of education: Not on file   Highest education level: Not on file  Occupational History   Not on file  Tobacco Use   Smoking status: Never   Smokeless tobacco: Never  Vaping Use   Vaping Use: Never used  Substance and Sexual Activity   Alcohol use: Yes    Comment: occassionally   Drug use: No   Sexual activity: Not Currently  Other Topics Concern   Not on file  Social History Narrative   Social History      Diet?       Do you drink/eat things with caffeine? yes      Marital status?            single                        What year were you married?      Do you live in a house, apartment, assisted living, condo, trailer, etc.? house      Is it one or more stories? Yes but seldom go upstairs      How many persons live in your home? 1       Do you have any pets in your home?  (please list) no       Highest level of education completed? 12- business college      Current or past profession: retired      Do you exercise?  yes                    Type & how often? 5 days a week- 1 hour per day      Advanced Directives      Do you have a living will? yes      Do you have a DNR form?                                  If not, do you want to discuss one? yes      Do you have signed POA/HPOA for forms?  yes      Functional Status      Do you have difficulty bathing or dressing yourself? no      Do you have difficulty preparing food or eating? no      Do you have difficulty managing your medications? no      Do you have difficulty managing your finances?  no      Do you have difficulty affording your medications?  no      Social Determinants of Health   Financial Resource Strain: Not on file  Food Insecurity: Not on file  Transportation Needs: Not on file  Physical Activity: Not on file  Stress: Not on file  Social Connections: Not on file    Tobacco Counseling Counseling given: Not Answered   Clinical Intake:  Pre-visit preparation completed: Yes  Pain : No/denies pain     BMI - recorded: 32 Diabetes: No  How often do you need to have someone help you when you read instructions, pamphlets, or other written materials from your doctor or pharmacy?: 1 - Never  Diabetic?no         Activities of Daily Living In your present state of health, do you have any difficulty performing the following activities: 02/19/2021  Hearing? N  Vision? N  Difficulty concentrating or making decisions? N  Walking or climbing stairs? N  Dressing or bathing? N  Doing errands, shopping? N  Preparing Food and eating ? N  Using the Toilet? N  In the past six months, have you accidently leaked urine? N  Do you have problems with loss of bowel control? N  Managing your Medications? N  Managing your Finances? N  Housekeeping or managing  your Housekeeping? N  Some recent data might be hidden    Patient Care Team: [name], NP as PCP - General (Geriatric Medicine) Warden Fillers, MD as Consulting Physician (Ophthalmology)  Indicate any recent Medical Services you may have received from other than [facility] providers in the past year (date may be approximate).     Assessment:   This is a routine wellness examination for [name] [name].  Hearing/Vision screen Hearing Screening - Comments:: Patient has no hearing problems Vision Screening - Comments:: Patient has no vision problems. Patient has had recent eye exam. Patient sees Dr. Katy Fitch  Dietary issues and exercise activities discussed: Current Exercise Habits: Home exercise routine, Type of exercise: walking;calisthenics, Time (Minutes): 45, Frequency (Times/Week): 5, Weekly Exercise (Minutes/Week): 225   Goals Addressed   None    Depression Screen PHQ 2/9 Scores 02/19/2021 04/11/2020 02/16/2020 02/14/2019 04/23/2018 03/23/2018  PHQ - 2 Score 0 0 0 0 0 0    Fall Risk Fall Risk  02/19/2021 02/11/2021 07/19/2020 06/05/2020 04/11/2020  Falls in the past year? 0 0 0 0 1  Number falls in past  yr: 0 0 0 0 0  Injury with Fall? 0 0 0 0 0  Comment - - - - -  Risk for fall due to : No Fall Risks No Fall Risks - - -  Follow up Falls evaluation completed Falls evaluation completed;Education provided;Falls prevention discussed - - -    FALL RISK PREVENTION PERTAINING TO THE HOME:  Any stairs in or around the home? Yes  If so, are there any without handrails? No  Home free of loose throw rugs in walkways, pet beds, electrical cords, etc? Yes  Adequate lighting in your home to reduce risk of falls? Yes   ASSISTIVE DEVICES UTILIZED TO PREVENT FALLS:  Life alert? Yes  Use of a cane, walker or w/c? No  Grab bars in the bathroom? No  Shower chair or bench in shower? No  Elevated toilet seat or a handicapped toilet? No   TIMED UP AND GO:  Was the test performed? No .     Cognitive Function: MMSE - Mini Mental State Exam 02/14/2019  Orientation to time 4  Orientation to Place 5  Registration 3  Attention/ Calculation 5  Recall 3  Language- name 2 objects 2  Language- repeat 1  Language- follow 3 step command 3  Language- read & follow direction 1  Write a sentence 1  Copy design 1  Total score 29     6CIT Screen 02/19/2021 02/16/2020  What Year? 0 points 0 points  What month? 0 points 0 points  What time? 0 points 0 points  Count back from 20 0 points 0 points  Months in reverse 0 points 0 points  Repeat phrase 0 points 2 points  Total Score 0 2    Immunizations Immunization History  Administered Date(s) Administered   Fluad Quad(high Dose 65+) 02/14/2019, 02/11/2021   Influenza, High Dose Seasonal PF 03/26/2017, 03/23/2018, 04/03/2020   Influenza,inj,Quad PF,6+ Mos 02/13/2013, 03/26/2015   Influenza-Unspecified 06/02/2016   PFIZER(Purple Top)SARS-COV-2 Vaccination 06/24/2019, 07/15/2019, 02/27/2020, 10/24/2020   PPD Test 08/10/2018   Pneumococcal Conjugate-13 03/24/2014   Pneumococcal Polysaccharide-23 04/23/2018   Tdap 03/24/2018   Tetanus 06/02/2001   Zoster Recombinat (Shingrix) 03/24/2018, 06/01/2018   Zoster, Live 06/02/2012    TDAP status: Up to date  Flu Vaccine status: Up to date  Pneumococcal vaccine status: Up to date  Covid-19 vaccine status: Information provided on how to obtain vaccines.   Qualifies for Shingles Vaccine? Yes   Zostavax completed Yes   Shingrix Completed?: Yes  Screening Tests Health Maintenance  Topic Date Due   MAMMOGRAM  02/13/2022   COLONOSCOPY (Pts 45-19yrs Insurance coverage will need to be confirmed)  03/19/2022   TETANUS/TDAP  03/24/2028   INFLUENZA VACCINE  Completed   DEXA SCAN  Completed   COVID-19 Vaccine  Completed   Hepatitis C Screening  Completed   Zoster Vaccines- Shingrix  Completed   HPV VACCINES  Aged Out    Health Maintenance  There are no preventive care  reminders to display for this patient.  Colorectal cancer screening: Type of screening: Colonoscopy. Completed 2013. Repeat every 10 years  Mammogram status: Completed 02/14/20. Repeat every year  Bone Density status: Completed 12/25/20. Results reflect: Bone density results: NORMAL. Repeat every 2 years.  Lung Cancer Screening: (Low Dose CT Chest recommended if Age 31-80 years, 30 pack-year currently smoking OR have quit w/in 15years.) does not qualify.   Lung Cancer Screening Referral: na  Additional Screening:  Hepatitis C Screening: does qualify; Completed   Vision  Screening: Recommended annual ophthalmology exams for early detection of glaucoma and other disorders of the eye. Is the patient up to date with their annual eye exam?  yes Who is the provider or what is the name of the office in which the patient attends annual eye exams? Dr Katy Fitch If pt is not established with a provider, would they like to be referred to a provider to establish care? No .   Dental Screening: Recommended annual dental exams for proper oral hygiene  Community Resource Referral / Chronic Care Management: CRR required this visit?  No   CCM required this visit?  No      Plan:     I have personally reviewed and noted the following in the patient's chart:   Medical and social history Use of alcohol, tobacco or illicit drugs  Current medications and supplements including opioid prescriptions.  Functional ability and status Nutritional status Physical activity Advanced directives List of other physicians Hospitalizations, surgeries, and ER visits in previous 12 months Vitals Screenings to include cognitive, depression, and falls Referrals and appointments  In addition, I have reviewed and discussed with patient certain preventive protocols, quality metrics, and best practice recommendations. A written personalized care plan for preventive services as well as general preventive health  recommendations were provided to patient.     [name], NP   02/19/2021    Virtual Visit via Telephone Note  I connected withNAME@ on 02/19/21 at 10:00 AM EDT by telephone and verified that I am speaking with the correct person using two identifiers.  Location: Patient: home Provider: [facility]   I discussed the limitations, risks, security and privacy concerns of performing an evaluation and management service by telephone and the availability of in person appointments. I also discussed with the patient that there may be a patient responsible charge related to this service. The patient expressed understanding and agreed to proceed.   I discussed the assessment and treatment plan with the patient. The patient was provided an opportunity to ask questions and all were answered. The patient agreed with the plan and demonstrated an understanding of the instructions.   The patient was advised to call back or seek an in-person evaluation if the symptoms worsen or if the condition fails to improve as anticipated.  I provided 18 minutes of non-face-to-face time during this encounter.  [employer]. Harle Battiest [employer] printed and mailed

## 2021-02-19 NOTE — Patient Instructions (Signed)
[name] [name] , Thank you for taking time to come for your Medicare Wellness Visit. I appreciate your ongoing commitment to your health goals. Please review the following plan we discussed and let me know if I can assist you in the future.   Screening recommendations/referrals: Colonoscopy up to date Mammogram up to date Bone Density up to date Recommended yearly ophthalmology/optometry visit for glaucoma screening and checkup Recommended yearly dental visit for hygiene and checkup  Vaccinations: Influenza vaccine up to date Pneumococcal vaccine up to date Tdap vaccine up to date Shingles vaccine up to date    Advanced directives: on file.   Conditions/risks identified: advanced age.  Next appointment: 1 year   Preventive Care 65 Years and Older, Female Preventive care refers to lifestyle choices and visits with your health care provider that can promote health and wellness. What does preventive care include? A yearly physical exam. This is also called an annual well check. Dental exams once or twice a year. Routine eye exams. Ask your health care provider how often you should have your eyes checked. Personal lifestyle choices, including: Daily care of your teeth and gums. Regular physical activity. Eating a healthy diet. Avoiding tobacco and drug use. Limiting alcohol use. Practicing safe sex. Taking low-dose aspirin every day. Taking vitamin and mineral supplements as recommended by your health care provider. What happens during an annual well check? The services and screenings done by your health care provider during your annual well check will depend on your age, overall health, lifestyle risk factors, and family history of disease. Counseling  Your health care provider may ask you questions about your: Alcohol use. Tobacco use. Drug use. Emotional well-being. Home and relationship well-being. Sexual activity. Eating habits. History of falls. Memory and ability  to understand (cognition). Work and work Statistician. Reproductive health. Screening  You may have the following tests or measurements: Height, weight, and BMI. Blood pressure. Lipid and cholesterol levels. These may be checked every 5 years, or more frequently if you are over 34 years old. Skin check. Lung cancer screening. You may have this screening every year starting at age 60 if you have a 30-pack-year history of smoking and currently smoke or have quit within the past 15 years. Fecal occult blood test (FOBT) of the stool. You may have this test every year starting at age 25. Flexible sigmoidoscopy or colonoscopy. You may have a sigmoidoscopy every 5 years or a colonoscopy every 10 years starting at age 48. Hepatitis C blood test. Hepatitis B blood test. Sexually transmitted disease (STD) testing. Diabetes screening. This is done by checking your blood sugar (glucose) after you have not eaten for a while (fasting). You may have this done every 1-3 years. Bone density scan. This is done to screen for osteoporosis. You may have this done starting at age 58. Mammogram. This may be done every 1-2 years. Talk to your health care provider about how often you should have regular mammograms. Talk with your health care provider about your test results, treatment options, and if necessary, the need for more tests. Vaccines  Your health care provider may recommend certain vaccines, such as: Influenza vaccine. This is recommended every year. Tetanus, diphtheria, and acellular pertussis (Tdap, Td) vaccine. You may need a Td booster every 10 years. Zoster vaccine. You may need this after age 79. Pneumococcal 13-valent conjugate (PCV13) vaccine. One dose is recommended after age 39. Pneumococcal polysaccharide (PPSV23) vaccine. One dose is recommended after age 29. Talk to your health care  provider about which screenings and vaccines you need and how often you need them. This information is not  intended to replace advice given to you by your health care provider. Make sure you discuss any questions you have with your health care provider. Document Released: 06/15/2015 Document Revised: 02/06/2016 Document Reviewed: 03/20/2015 Elsevier Interactive Patient Education  2017 [place] Prevention in the Home Falls can cause injuries. They can happen to people of all ages. There are many things you can do to make your home safe and to help prevent falls. What can I do on the outside of my home? Regularly fix the edges of walkways and driveways and fix any cracks. Remove anything that might make you trip as you walk through a door, such as a raised step or threshold. Trim any bushes or trees on the path to your home. Use bright outdoor lighting. Clear any walking paths of anything that might make someone trip, such as rocks or tools. Regularly check to see if handrails are loose or broken. Make sure that both sides of any steps have handrails. Any raised decks and porches should have guardrails on the edges. Have any leaves, snow, or ice cleared regularly. Use sand or salt on walking paths during winter. Clean up any spills in your garage right away. This includes oil or grease spills. What can I do in the bathroom? Use night lights. Install grab bars by the toilet and in the tub and shower. Do not use towel bars as grab bars. Use non-skid mats or decals in the tub or shower. If you need to sit down in the shower, use a plastic, non-slip stool. Keep the floor dry. Clean up any water that spills on the floor as soon as it happens. Remove soap buildup in the tub or shower regularly. Attach bath mats securely with double-sided non-slip rug tape. Do not have throw rugs and other things on the floor that can make you trip. What can I do in the bedroom? Use night lights. Make sure that you have a light by your bed that is easy to reach. Do not use any sheets or blankets that are  too big for your bed. They should not hang down onto the floor. Have a firm chair that has side arms. You can use this for support while you get dressed. Do not have throw rugs and other things on the floor that can make you trip. What can I do in the kitchen? Clean up any spills right away. Avoid walking on wet floors. Keep items that you use a lot in easy-to-reach places. If you need to reach something above you, use a strong step stool that has a grab bar. Keep electrical cords out of the way. Do not use floor polish or wax that makes floors slippery. If you must use wax, use non-skid floor wax. Do not have throw rugs and other things on the floor that can make you trip. What can I do with my stairs? Do not leave any items on the stairs. Make sure that there are handrails on both sides of the stairs and use them. Fix handrails that are broken or loose. Make sure that handrails are as long as the stairways. Check any carpeting to make sure that it is firmly attached to the stairs. Fix any carpet that is loose or worn. Avoid having throw rugs at the top or bottom of the stairs. If you do have throw rugs, attach them to the  floor with carpet tape. Make sure that you have a light switch at the top of the stairs and the bottom of the stairs. If you do not have them, ask someone to add them for you. What else can I do to help prevent falls? Wear shoes that: Do not have high heels. Have rubber bottoms. Are comfortable and fit you well. Are closed at the toe. Do not wear sandals. If you use a stepladder: Make sure that it is fully opened. Do not climb a closed stepladder. Make sure that both sides of the stepladder are locked into place. Ask someone to hold it for you, if possible. Clearly mark and make sure that you can see: Any grab bars or handrails. First and last steps. Where the edge of each step is. Use tools that help you move around (mobility aids) if they are needed. These  include: Canes. Walkers. Scooters. Crutches. Turn on the lights when you go into a dark area. Replace any light bulbs as soon as they burn out. Set up your furniture so you have a clear path. Avoid moving your furniture around. If any of your floors are uneven, fix them. If there are any pets around you, be aware of where they are. Review your medicines with your doctor. Some medicines can make you feel dizzy. This can increase your chance of falling. Ask your doctor what other things that you can do to help prevent falls. This information is not intended to replace advice given to you by your health care provider. Make sure you discuss any questions you have with your health care provider. Document Released: 03/15/2009 Document Revised: 10/25/2015 Document Reviewed: 06/23/2014 Elsevier Interactive Patient Education  2017 [employer].

## 2021-02-19 NOTE — Telephone Encounter (Signed)
Ms. aviyanna, colbaugh are scheduled for a virtual visit with your provider today.    Just as we do with appointments in the office, we must obtain your consent to participate.  Your consent will be active for this visit and any virtual visit you may have with one of our providers in the next 365 days.    If you have a MyChart account, I can also send a copy of this consent to you electronically.  All virtual visits are billed to your insurance company just like a traditional visit in the office.  As this is a virtual visit, video technology does not allow for your provider to perform a traditional examination.  This may limit your provider's ability to fully assess your condition.  If your provider identifies any concerns that need to be evaluated in person or the need to arrange testing such as labs, EKG, etc, we will make arrangements to do so.    Although advances in technology are sophisticated, we cannot ensure that it will always work on either your end or our end.  If the connection with a video visit is poor, we may have to switch to a telephone visit.  With either a video or telephone visit, we are not always able to ensure that we have a secure connection.   I need to obtain your verbal consent now.   Are you willing to proceed with your visit today?   Jacquelyn Tecia Cinnamon has provided verbal consent on 02/19/2021 for a virtual visit (video or telephone).   Carroll Kinds, [facility] 02/19/2021  9:38 AM

## 2021-03-18 ENCOUNTER — Other Ambulatory Visit: Payer: Self-pay

## 2021-03-18 ENCOUNTER — Encounter (HOSPITAL_COMMUNITY): Payer: Self-pay | Admitting: *Deleted

## 2021-03-18 ENCOUNTER — Emergency Department (HOSPITAL_COMMUNITY)
Admission: EM | Admit: 2021-03-18 | Discharge: 2021-03-18 | Disposition: A | Discharge status: Home / Self Care | Payer: Medicare Other | Attending: Emergency Medicine | Admitting: Emergency Medicine

## 2021-03-18 ENCOUNTER — Emergency Department (HOSPITAL_COMMUNITY): Payer: Medicare Other

## 2021-03-18 DIAGNOSIS — Z7982 Long term (current) use of aspirin: Secondary | ICD-10-CM | POA: Insufficient documentation

## 2021-03-18 DIAGNOSIS — W19XXXA Unspecified fall, initial encounter: Secondary | ICD-10-CM

## 2021-03-18 DIAGNOSIS — Y9301 Activity, walking, marching and hiking: Secondary | ICD-10-CM | POA: Diagnosis not present

## 2021-03-18 DIAGNOSIS — W102XXA Fall (on)(from) incline, initial encounter: Secondary | ICD-10-CM | POA: Diagnosis not present

## 2021-03-18 DIAGNOSIS — S6991XA Unspecified injury of right wrist, hand and finger(s), initial encounter: Secondary | ICD-10-CM | POA: Diagnosis present

## 2021-03-18 DIAGNOSIS — M7989 Other specified soft tissue disorders: Secondary | ICD-10-CM | POA: Diagnosis not present

## 2021-03-18 DIAGNOSIS — S0083XA Contusion of other part of head, initial encounter: Secondary | ICD-10-CM | POA: Insufficient documentation

## 2021-03-18 DIAGNOSIS — S61411A Laceration without foreign body of right hand, initial encounter: Secondary | ICD-10-CM | POA: Diagnosis not present

## 2021-03-18 DIAGNOSIS — I10 Essential (primary) hypertension: Secondary | ICD-10-CM | POA: Diagnosis not present

## 2021-03-18 DIAGNOSIS — Z8616 Personal history of COVID-19: Secondary | ICD-10-CM | POA: Insufficient documentation

## 2021-03-18 DIAGNOSIS — S0990XA Unspecified injury of head, initial encounter: Secondary | ICD-10-CM | POA: Diagnosis not present

## 2021-03-18 DIAGNOSIS — Z79899 Other long term (current) drug therapy: Secondary | ICD-10-CM | POA: Insufficient documentation

## 2021-03-18 MED ORDER — LIDOCAINE HCL (PF) 1 % IJ SOLN
5.0000 mL | Freq: Once | INTRAMUSCULAR | Status: DC
  Filled 2021-03-18: qty 30

## 2021-03-18 MED ORDER — DOXYCYCLINE HYCLATE 100 MG PO CAPS: 100.0000 mg | ORAL_CAPSULE | Freq: Two times a day (BID) | ORAL | 0 refills | Status: AC

## 2021-03-18 NOTE — ED Triage Notes (Signed)
Fell at home, laceration to right hand and has a knot to right side of head

## 2021-03-18 NOTE — ED Provider Notes (Signed)
[facility] EMERGENCY DEPARTMENT Provider Note   CSN: [id number] Arrival date & time: 03/18/21  1512     History Chief Complaint  Patient presents with   Fall    [name] [name] is a 75 y.o. female.  HPI  Patient with no significant medical history presents to the emergency department with chief complaint of a mechanical fall.  Patient states today she was taking a glass bowl into her shed, as she was walking up the ramp the ramp gave way causing her to fall onto her right side.  States that the glass bowl broke and cause her right hand.   She states that she hit her head, denies losing conscious, is not on anticoagulant.  States she was able to get herself up off the ground, she states that she has a slight headache as well as a goose egg on the right side of her head.  She denies feeling lightheaded, dizziness, change in vision, paresthesia/ weakness upper lower extremities, denies nausea vomiting or feeling off balance.  She has no neck pain, back pain, chest pain, pain in upper or lower extremities, states that she has 2 small lax in her right hand,  able to stop the bleeding with pressure, she has full range of motion in her fingers, she has no complaints time.  She up-to-date on her tetanus shot.  She denies alleviating or aggravating factors.  Past Medical History:  Diagnosis Date   Acid reflux    Bowel obstruction ([place])    COVID-19    H/O hiatal hernia    Hyperlipidemia    Hypertension     Patient Active Problem List   Diagnosis Date Noted   Hx of small bowel obstruction 03/23/2018   GERD (gastroesophageal reflux disease) 03/24/2015   Hypokalemia 06/21/2014   Hyperglycemia 06/20/2014   Hypertension    Hyperlipidemia    Bowel obstruction ([place]) 10/01/2013   Fatty liver 02/12/2013   Transaminitis 02/12/2013    Past Surgical History:  Procedure Laterality Date   ABDOMINAL HYSTERECTOMY     APPENDECTOMY     BREAST CYST EXCISION Right    [phone]? benign    COLONOSCOPY  03/18/2012   Procedure: COLONOSCOPY;  Surgeon: Rogene Houston, MD;  Location: [facility];  Service: Endoscopy;  Laterality: N/A;  930   LAPAROTOMY N/A 02/14/2013   Procedure: EXPLORATORY LAPAROTOMY;  Surgeon: Jamesetta So, MD;  Location: [facility] ORS;  Service: General;  Laterality: N/A;   MELANOMA EXCISION  03/2019   Dr.Hall, removed from [place]  02/2019   Melanoma removed from upper back. Dr. Nevada Crane, Jenny Reichmann      OB History   No obstetric history on file.     Family History  Problem Relation Age of Onset   Cancer - Other Mother        Kidney   Fibromyalgia Mother    Arthritis Mother    Cancer - Lung Father 32   Heart disease Brother 43       had heart transplant then died 3 years later after restarting smoking   Lung disease Paternal Aunt        Lung Mass   COPD Paternal Uncle    Leukemia Paternal Uncle    Stomach cancer Neg Hx    Esophageal cancer Neg Hx    Pancreatic cancer Neg Hx    Rectal cancer Neg Hx     Social History   Tobacco Use   Smoking status: Never  Smokeless tobacco: Never  Vaping Use   Vaping Use: Never used  Substance Use Topics   Alcohol use: Yes    Comment: occassionally   Drug use: No    Home Medications Prior to Admission medications   Medication Sig Start Date End Date Taking? Authorizing Provider  doxycycline (VIBRAMYCIN) 100 MG capsule Take 1 capsule (100 mg total) by mouth 2 (two) times daily for 7 days. 03/18/21 03/25/21 Yes Marcello Fennel, PA-C  amLODipine (NORVASC) 5 MG tablet TAKE 1 TABLET DAILY 01/28/21   [name], NP  Ascorbic Acid (VITAMIN C PO) Take by mouth 2 (two) times daily.    [provider]  aspirin EC 81 MG tablet Take 81 mg by mouth daily.    [provider]  atorvastatin (LIPITOR) 10 MG tablet TAKE 1 TABLET DAILY Patient not taking: Reported on 02/19/2021 07/27/20   [name], NP  beta carotene w/minerals (OCUVITE) tablet Take 1 tablet by mouth daily.     [provider]  Calcium Carbonate-Vitamin D (CALCIUM 600 + D PO) Take 1 tablet by mouth 2 (two) times daily.    [provider]  Cholecalciferol (VITAMIN D3) 250 MCG (10000 UT) capsule Take 10,000 Units by mouth daily.    [provider]  docusate sodium (COLACE) 100 MG capsule Take 100 mg by mouth 2 (two) times daily.    [provider]  famotidine (PEPCID) 20 MG tablet Take 20 mg by mouth as needed for indigestion or heartburn.    [provider]  Fish Oil-Cholecalciferol (FISH OIL + D3) 1000-1000 MG-UNIT CAPS Take 1 capsule by mouth 2 (two) times daily.    [provider]  Lactobacillus-Inulin (CULTURELLE DIGESTIVE HEALTH PO) Take 1 capsule by mouth daily.     [provider]  losartan (COZAAR) 50 MG tablet TAKE 1 TABLET DAILY 01/21/21   [name], NP  Misc Natural Products (OSTEO BI-FLEX ADV DOUBLE ST PO) Take 1 tablet by mouth 2 (two) times daily.    [provider]  Multiple Vitamin (MULTIVITAMIN WITH MINERALS) TABS Take 1 tablet by mouth daily.    [provider]  omeprazole (PRILOSEC) 40 MG capsule TAKE 1 CAPSULE DAILY 12/17/20   [name], NP  polyethylene glycol powder (GLYCOLAX/MIRALAX) powder Take 17 g by mouth daily.    [provider]  vitamin B-12 (CYANOCOBALAMIN) 1000 MCG tablet Take 1 tablet (1,000 mcg total) by mouth daily. 08/24/19   [name], NP  zinc gluconate 50 MG tablet Take 50 mg by mouth daily.    [provider]    Allergies    Patient has no known allergies.  Review of Systems   Review of Systems  Constitutional:  Negative for chills and fever.  HENT:  Negative for congestion.   Respiratory:  Negative for shortness of breath.   Cardiovascular:  Negative for chest pain.  Gastrointestinal:  Negative for abdominal pain.  Genitourinary:  Negative for enuresis.  Musculoskeletal:  Negative for back pain.  Skin:  Positive for wound. Negative for  rash.  Neurological:  Positive for headaches. Negative for dizziness.  Hematological:  Does not bruise/bleed easily.   Physical Exam Updated Vital Signs BP (!) 155/88 (BP Location: Right Arm)   Pulse 64   Temp 98.2 F (36.8 C) (Oral)   Resp 16   Ht 5\' 4"  (1.626 m)   Wt 84.8 kg   LMP 09/10/2014 (Exact Date)   SpO2 97%   BMI 32.10  kg/m   Physical Exam Vitals and nursing note reviewed.  Constitutional:      General: She is not in acute distress.    Appearance: She is not ill-appearing.  HENT:     Head: Normocephalic and atraumatic.     Comments: Patient has a small hematoma on the right parietal lobe, hemodynamically stable, no other gross otherwise present, no raccoon eyes or battle sign present.    Nose: No congestion.     Mouth/Throat:     Mouth: Mucous membranes are moist.     Pharynx: Oropharynx is clear.  Eyes:     Extraocular Movements: Extraocular movements intact.     Conjunctiva/sclera: Conjunctivae normal.     Pupils: Pupils are equal, round, and reactive to light.  Cardiovascular:     Rate and Rhythm: Normal rate and regular rhythm.     Pulses: Normal pulses.     Heart sounds: No murmur heard.   No friction rub. No gallop.  Pulmonary:     Effort: No respiratory distress.     Breath sounds: No wheezing, rhonchi or rales.  Chest:     Chest wall: No tenderness.  Abdominal:     Palpations: Abdomen is soft.     Tenderness: There is no abdominal tenderness. There is no right CVA tenderness or left CVA tenderness.  Musculoskeletal:     Cervical back: No tenderness.     Comments: Patient is able to move all 4 extremities, she has full range of motion, neurovascularly intact.  Spine was palpated nontender to palpation.  Patient has 2 small lacerations on her right hand,  palmar aspect, first lactic was in between the index finger and the thumb, measures approximately 4 cm in length, 1 mm in depth, other laceration was at the bottom of the thumb, runs perpendicular,  measured approximately 3 cm in length, hemodynamically stable, there is no noted ligament or tendon damage present.  She has full range of motion in all fingers and joints, neurovascular fully intact.  Skin:    General: Skin is warm and dry.  Neurological:     Mental Status: She is alert.     Comments: No facial asymmetry, no difficult word finding, no several words, able to follow two-step commands, no unilateral weakness present, gait fully intact.  Psychiatric:        Mood and Affect: Mood normal.    ED Results / Procedures / Treatments   Labs (all labs ordered are listed, but only abnormal results are displayed) Labs Reviewed - No data to display  EKG None  Radiology CT Head Wo Contrast  Result Date: 03/18/2021 CLINICAL DATA:  Head trauma, minor. Trauma to the right side of the head. EXAM: CT HEAD WITHOUT CONTRAST TECHNIQUE: Contiguous axial images were obtained from the base of the skull through the vertex without intravenous contrast. COMPARISON:  None. FINDINGS: Brain: Mild age related volume loss. Mild chronic small-vessel ischemic change of the hemispheric white matter. No sign of acute infarction, mass lesion, hemorrhage, hydrocephalus or extra-axial collection. Vascular: No abnormal vascular finding. Skull: No skull fracture. Sinuses/Orbits: Clear/normal Other: Mild right supra auricular soft tissue swelling. IMPRESSION: Mild age related volume loss. Mild chronic small-vessel ischemic change of the white matter. No acute intracranial finding or injury. No skull fracture. Electronically Signed   By: Nelson Chimes M.D.   On: 03/18/2021 20:50    Procedures .Marland KitchenLaceration Repair  Date/Time: 03/19/2021 12:07 AM Performed by: Marcello Fennel, PA-C Authorized by: Marcello Fennel, PA-C  Consent:    Consent obtained:  Verbal   Consent given by:  Patient   Risks discussed:  Infection, pain, retained foreign body, need for additional repair, poor cosmetic result, tendon  damage, vascular damage, poor wound healing and nerve damage   Alternatives discussed:  No treatment, delayed treatment, observation and referral Universal protocol:    Patient identity confirmed:  Verbally with patient Anesthesia:    Anesthesia method:  Local infiltration   Local anesthetic:  Lidocaine 1% w/o epi Laceration details:    Location:  Hand   Hand location:  R palm   Length (cm):  4   Depth (mm):  2 Pre-procedure details:    Preparation:  Patient was prepped and draped in usual sterile fashion Exploration:    Limited defect created (wound extended): no     Hemostasis achieved with:  Direct pressure   Imaging outcome: foreign body not noted     Wound exploration: wound explored through full range of motion and entire depth of wound visualized     Contaminated: no   Treatment:    Area cleansed with:  Saline   Amount of cleaning:  Standard   Irrigation solution:  Sterile saline   Visualized foreign bodies/material removed: no     Debridement:  None Skin repair:    Repair method:  Sutures   Suture size:  4-0   Suture material:  Prolene   Suture technique:  Simple interrupted   Number of sutures:  4 Approximation:    Approximation:  Close Repair type:    Repair type:  Simple Post-procedure details:    Dressing:  Non-adherent dressing   Procedure completion:  Tolerated well, no immediate complications .Marland KitchenLaceration Repair  Date/Time: 03/19/2021 12:09 AM Performed by: Marcello Fennel, PA-C Authorized by: Marcello Fennel, PA-C   Consent:    Consent obtained:  Verbal   Consent given by:  Patient   Risks discussed:  Infection, pain, retained foreign body, need for additional repair, poor cosmetic result, tendon damage, vascular damage, poor wound healing and nerve damage   Alternatives discussed:  No treatment, delayed treatment, observation and referral Universal protocol:    Patient identity confirmed:  Verbally with patient Anesthesia:    Anesthesia  method:  Local infiltration   Local anesthetic:  Lidocaine 1% w/o epi Laceration details:    Location:  Hand   Hand location:  R palm   Length (cm):  3   Depth (mm):  2 Pre-procedure details:    Preparation:  Patient was prepped and draped in usual sterile fashion Exploration:    Limited defect created (wound extended): no     Hemostasis achieved with:  Direct pressure   Imaging outcome: foreign body not noted     Wound exploration: wound explored through full range of motion and entire depth of wound visualized     Contaminated: no   Treatment:    Area cleansed with:  Saline   Amount of cleaning:  Standard   Irrigation solution:  Sterile saline   Visualized foreign bodies/material removed: no     Debridement:  None Skin repair:    Repair method:  Sutures   Suture size:  4-0   Suture material:  Prolene   Suture technique:  Simple interrupted   Number of sutures:  3 Approximation:    Approximation:  Close Repair type:    Repair type:  Simple Post-procedure details:    Dressing:  Non-adherent dressing   Procedure completion:  Tolerated well, no immediate  complications   Medications Ordered in ED Medications  lidocaine (PF) (XYLOCAINE) 1 % injection 5 mL (has no administration in time range)    ED Course  I have reviewed the triage vital signs and the nursing notes.  Pertinent labs & imaging results that were available during my care of the patient were reviewed by me and considered in my medical decision making (see chart for details).    MDM Rules/Calculators/A&P                          Initial impression-patient presents after a fall.  She is alert, does not appear in acute stress, vital signs reassuring.  Due to patient's age and active injury will obtain CT head for further evaluation.  Will recommend suturing the wound for improved wound healing.  Work-up-CT head is negative for acute findings.  Reassessment- Will recommend suturing to decrease infection risk  and to assist with the healing process.  Patient was agreeable with this and tolerated the procedure well.  Received 7 sutures, laceration between the index finger and thumb received 4 sutures, the one below the thumb received 3.  Neurovascular was fully intact after the procedure.  Rule out-Low suspicion for fracture or dislocation as x-ray of the hand as there is no gross tenderness present, she had full  of all of her joints, finger, it was nontender to palpation will defer imaging as my suspicion for this extremely low.  Low suspicion for ligament or tendon damage as area was palpated no gross defects noted, full range of motion of all fingers and joints. low suspicion for intracranial head bleed as patient denies loss of conscious, is not on anticoagulant, she does not endorse headaches, paresthesia/weakness in the upper and lower extremities, no focal deficits present on my exam, CT imaging negative for acute findings.  Low suspicion for spinal cord abnormality or spinal fracture spine was palpated was nontender to palpation, patient has full range of motion in the upper and lower extremities.  Low suspicion for pneumothorax as lung sounds are clear bilaterally, chest nontender to palpation.  Low suspicion for intra-abdominal trauma as abdomen soft nontender to palpation.     Plan-  Hand laceration-patient received total of 7 sutures, will start her on antibiotics, recommend basic wound care,, next 10 to 12 days for suture removal.  Vital signs have remained stable, no indication for hospital admission.  Patient discussed with attending and they agreed with assessment and plan.  Patient given at home care as well strict return precautions.  Patient verbalized that they understood agreed to said plan.  Final Clinical Impression(s) / ED Diagnoses Final diagnoses:  Fall, initial encounter  Laceration of right hand without foreign body, initial encounter    Rx / DC Orders ED Discharge Orders           Ordered    doxycycline (VIBRAMYCIN) 100 MG capsule  2 times daily        03/18/21 2211             Marcello Fennel, PA-C 03/19/21 [place], Ankit, MD 03/20/21 2049

## 2021-03-18 NOTE — Discharge Instructions (Addendum)
You received 7 sutures in your hand.  Please abstain from doing work for the first 24 hours.  After that like you to rinse out the wound and change the dressings 2-3 times a day for next 7 days.  Starting on antibiotics please take as prescribed.  You must follow-up in the next 10- 12 days for suture removal, may come back here, go to urgent care, your PCP for further evaluation.  Come back to the emergency department if you develop chest pain, shortness of breath, severe abdominal pain, uncontrolled nausea, vomiting, diarrhea.

## 2021-03-19 ENCOUNTER — Encounter: Payer: Self-pay | Admitting: Nurse Practitioner

## 2021-03-26 ENCOUNTER — Other Ambulatory Visit: Payer: Self-pay

## 2021-03-26 ENCOUNTER — Other Ambulatory Visit: Payer: Medicare Other

## 2021-03-26 DIAGNOSIS — R748 Abnormal levels of other serum enzymes: Secondary | ICD-10-CM | POA: Diagnosis not present

## 2021-03-26 LAB — COMPLETE METABOLIC PANEL WITH GFR
AG Ratio: 1.5 (calc) (ref 1.0–2.5)
ALT: 65 U/L — ABNORMAL HIGH (ref 6–29)
AST: 45 U/L — ABNORMAL HIGH (ref 10–35)
Albumin: 3.8 g/dL (ref 3.6–5.1)
Alkaline phosphatase (APISO): 80 U/L (ref 37–153)
BUN/Creatinine Ratio: 15 (calc) (ref 6–22)
BUN: 9 mg/dL (ref 7–25)
CO2: 27 mmol/L (ref 20–32)
Calcium: 9.1 mg/dL (ref 8.6–10.4)
Chloride: 106 mmol/L (ref 98–110)
Creat: 0.59 mg/dL — ABNORMAL LOW (ref 0.60–1.00)
Globulin: 2.5 g/dL (calc) (ref 1.9–3.7)
Glucose, Bld: 92 mg/dL (ref 65–99)
Potassium: 4 mmol/L (ref 3.5–5.3)
Sodium: 140 mmol/L (ref 135–146)
Total Bilirubin: 0.6 mg/dL (ref 0.2–1.2)
Total Protein: 6.3 g/dL (ref 6.1–8.1)
eGFR: 95 mL/min/{1.73_m2} (ref >=60)

## 2021-03-27 ENCOUNTER — Encounter: Payer: Self-pay | Admitting: Nurse Practitioner

## 2021-03-28 ENCOUNTER — Ambulatory Visit (INDEPENDENT_AMBULATORY_CARE_PROVIDER_SITE_OTHER): Payer: Medicare Other | Admitting: Family

## 2021-03-28 ENCOUNTER — Other Ambulatory Visit: Payer: Self-pay

## 2021-03-28 ENCOUNTER — Encounter: Payer: Self-pay | Admitting: Family

## 2021-03-28 VITALS — BP 130/80 | HR 68 | Temp 97.1°F | Resp 16 | Ht 64.0 in | Wt 186.6 lb

## 2021-03-28 DIAGNOSIS — S61411D Laceration without foreign body of right hand, subsequent encounter: Secondary | ICD-10-CM

## 2021-03-28 DIAGNOSIS — Z4802 Encounter for removal of sutures: Secondary | ICD-10-CM

## 2021-03-28 NOTE — Patient Instructions (Addendum)
Apply bandage to right hand stitch removal site for 3 days. Monitor hand for any redness or drainage.Notify provider

## 2021-03-28 NOTE — Progress Notes (Addendum)
Provider: Soraya Paquette FNP-C  [name], NP  Patient Care Team: [name], NP as PCP - General (Geriatric Medicine) Warden Fillers, MD as Consulting Physician (Ophthalmology)  Extended Emergency Contact Information Primary Emergency Contact: [facility] Address: [address]          [place], [place] [address] Johnnette Litter of [place] Phone: [phone] Relation: Sister Secondary Emergency Contact: Tia Masker Address: [address]          [place], [place] [address] Johnnette Litter of [place] Phone: [phone] Mobile Phone: [phone] Relation: Niece  Code Status:  DNR Goals of care: Advanced Directive information Advanced Directives 03/28/2021  Does Patient Have a Medical Advance Directive? Yes  Type of Paramedic of [place];Living will  Does patient want to make changes to medical advance directive? No - Patient declined  Copy of [place] in Chart? Yes - validated most recent copy scanned in chart (See row information)  Would patient like information on creating a medical advance directive? -  Pre-existing out of facility DNR order (yellow form or pink MOST form) -     Chief Complaint  Patient presents with   Acute Visit    Patient needs stiches removed.     HPI:  Pt is a 75 y.o. female seen today for an acute visit for removal of stitches  on right hand. States sustained a fall when the porch broke through causing her to fall while carrying a glass cake cover which cut  her right hand. She was seen in the ED and laceration was sutured.  She is here for stitches to be removed.denies any redness or drainage from the stitched area.    Past Medical History:  Diagnosis Date   Acid reflux    Bowel obstruction ([place])    COVID-19    H/O hiatal hernia    Hyperlipidemia    Hypertension    Past Surgical History:  Procedure Laterality Date   ABDOMINAL HYSTERECTOMY      APPENDECTOMY     BREAST CYST EXCISION Right    [phone]? 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 Dr. Nevada Crane, Jenny Reichmann     No Known Allergies  Outpatient Encounter Medications as of 03/28/2021  Medication Sig   amLODipine (NORVASC) 5 MG tablet TAKE 1 TABLET DAILY   Ascorbic Acid (VITAMIN C PO) Take by mouth 2 (two) times daily.   aspirin EC 81 MG tablet Take 81 mg by mouth daily.   beta carotene w/minerals (OCUVITE) tablet Take 1 tablet by mouth daily.   Calcium Carbonate-Vitamin D (CALCIUM 600 + D PO) Take 1 tablet by mouth 2 (two) times daily.   Cholecalciferol (VITAMIN D3) 250 MCG (10000 UT) capsule Take 10,000 Units by mouth daily.   docusate sodium (COLACE) 100 MG capsule Take 100 mg by mouth 2 (two) times daily.   famotidine (PEPCID) 20 MG tablet Take 20 mg by mouth as needed for indigestion or heartburn.   Fish Oil-Cholecalciferol (FISH OIL + D3) 1000-1000 MG-UNIT CAPS Take 1 capsule by mouth 2 (  two) times daily.   Lactobacillus-Inulin (CULTURELLE DIGESTIVE HEALTH PO) Take 1 capsule by mouth daily.    losartan (COZAAR) 50 MG tablet TAKE 1 TABLET DAILY   Misc Natural Products (OSTEO BI-FLEX ADV DOUBLE ST PO) Take 1 tablet by mouth 2 (two) times daily.   Multiple Vitamin (MULTIVITAMIN WITH MINERALS) TABS Take 1 tablet by mouth daily.   omeprazole (PRILOSEC) 40 MG capsule TAKE 1 CAPSULE DAILY   polyethylene glycol powder (GLYCOLAX/MIRALAX) powder Take 17 g by mouth daily.   vitamin B-12 (CYANOCOBALAMIN) 1000 MCG tablet Take 1 tablet (1,000 mcg total) by mouth daily.   zinc gluconate 50 MG tablet Take 50  mg by mouth daily.   [DISCONTINUED] atorvastatin (LIPITOR) 10 MG tablet TAKE 1 TABLET DAILY (Patient not taking: Reported on 02/19/2021)   No facility-administered encounter medications on file as of 03/28/2021.    Review of Systems  Constitutional:  Negative for chills, fatigue and fever.  Skin:  Negative for color change, rash and wound.       Right hand stitches   Neurological:  Negative for dizziness, weakness, light-headedness, numbness and headaches.  Psychiatric/Behavioral:  Negative for agitation, confusion and sleep disturbance.    Immunization History  Administered Date(s) Administered   Fluad Quad(high Dose 65+) 02/14/2019, 02/11/2021   Influenza, High Dose Seasonal PF 03/26/2017, 03/23/2018, 04/03/2020   Influenza,inj,Quad PF,6+ Mos 02/13/2013, 03/26/2015   Influenza-Unspecified 06/02/2016   PFIZER(Purple Top)SARS-COV-2 Vaccination 06/24/2019, 07/15/2019, 02/27/2020, 10/24/2020   PPD Test 08/10/2018   Pneumococcal Conjugate-13 03/24/2014   Pneumococcal Polysaccharide-23 04/23/2018   Tdap 03/24/2018   Tetanus 06/02/2001   Zoster Recombinat (Shingrix) 03/24/2018, 06/01/2018   Zoster, Live 06/02/2012   Pertinent  Health Maintenance Due  Topic Date Due   MAMMOGRAM  02/13/2022   COLONOSCOPY (Pts 45-24yrs Insurance coverage will need to be confirmed)  03/19/2022   INFLUENZA VACCINE  Completed   DEXA SCAN  Completed   Fall Risk 08/13/2020 02/11/2021 02/19/2021 03/18/2021 03/28/2021  Falls in the past year? - 0 0 - 1  Was there an injury with Fall? - 0 0 - 1  Was there an injury with Fall? - - - - -  Fall Risk Category Calculator - 0 0 - 2  Fall Risk Category - Low Low - Moderate  Patient Fall Risk Level Low fall risk Low fall risk Low fall risk Low fall risk Moderate fall risk  Patient at Risk for Falls Due to - No Fall Risks No Fall Risks - History of fall(s)  Fall risk Follow up - Falls evaluation completed;Education provided;Falls prevention discussed Falls evaluation  completed - Falls evaluation completed;Education provided;Falls prevention discussed   Functional Status Survey:    Vitals:   03/28/21 1341  BP: 130/80  Pulse: 68  Resp: 16  Temp: (!) 97.1 F (36.2 C)  SpO2: 90%  Weight: 186 lb 9.6 oz (84.6 kg)  Height: 5\' 4"  (1.626 m)   Body mass index is 32.03 kg/m. Physical Exam Vitals reviewed.  Constitutional:      General: She is not in acute distress.    Appearance: She is not ill-appearing.  HENT:     Head: Normocephalic.  Cardiovascular:     Rate and Rhythm: Normal rate and regular rhythm.  Pulmonary:     Effort: Pulmonary effort is normal. No respiratory distress.     Breath sounds: Normal breath sounds. No wheezing, rhonchi or rales.  Chest:     Chest wall: No tenderness.  Skin:    General: Skin is warm  and dry.     Coloration: Skin is not pale.     Findings: No erythema or rash.     Comments: Right hand laceration site stitches intact,cleansed with sterile alcohol pad.No erythema,swelling or drainage noted.stitches along Thumb area x 4 removed without any difficulties x 3 stitches on base of thumb were very tightly stitched but was able to remove with difficulties getting scissor under to cut but was successful after several attempts patient tolerated procedure well.area cleaned with betadine ad covered with Band Aid.    Neurological:     Mental Status: She is alert and oriented to person, place, and time.     Sensory: No sensory deficit.     Motor: No weakness.     Gait: Gait normal.    Labs reviewed: Recent Labs    08/10/20 1507 02/08/21 0809 03/26/21 0805  NA 139 142 140  K 4.0 4.0 4.0  CL 107 107 106  CO2 25 26 27   GLUCOSE 87 88 92  BUN 10 12 9   CREATININE 0.56* 0.65 0.59*  CALCIUM 9.1 9.2 9.1   Recent Labs    08/10/20 1507 02/08/21 0809 03/26/21 0805  AST 37* 53* 45*  ALT 52* 67* 65*  BILITOT 0.6 0.8 0.6  PROT 6.7 6.5 6.3   Recent Labs    04/11/20 1550 02/08/21 0809  WBC 8.2 6.2  NEUTROABS  4,026 2,914  HGB 14.5 14.7  HCT 42.2 43.9  MCV 93.6 95.4  PLT 263 239   Lab Results  Component Value Date   TSH 1.87 08/23/2019   Lab Results  Component Value Date   HGBA1C 5.6 03/21/2019   Lab Results  Component Value Date   CHOL 111 02/08/2021   HDL 42 (L) 02/08/2021   LDLCALC 44 02/08/2021   TRIG 172 (H) 02/08/2021   CHOLHDL 2.6 02/08/2021    Significant Diagnostic Results in last 30 days:  CT Head Wo Contrast  Result Date: 03/18/2021 CLINICAL DATA:  Head trauma, minor. Trauma to the right side of the head. EXAM: CT HEAD WITHOUT CONTRAST TECHNIQUE: Contiguous axial images were obtained from the base of the skull through the vertex without intravenous contrast. COMPARISON:  None. FINDINGS: Brain: Mild age related volume loss. Mild chronic small-vessel ischemic change of the hemispheric white matter. No sign of acute infarction, mass lesion, hemorrhage, hydrocephalus or extra-axial collection. Vascular: No abnormal vascular finding. Skull: No skull fracture. Sinuses/Orbits: Clear/normal Other: Mild right supra auricular soft tissue swelling. IMPRESSION: Mild age related volume loss. Mild chronic small-vessel ischemic change of the white matter. No acute intracranial finding or injury. No skull fracture. Electronically Signed   By: Nelson Chimes M.D.   On: 03/18/2021 20:50    Assessment/Plan  Laceration of right hand without foreign body, subsequent encounter Afebrile  Right hand laceration site stitches intact,cleansed with sterile alcohol pad.No erythema,swelling or drainage noted.stitches along Thumb area x 4 removed without any difficulties x 3 stitches on base of thumb were very tightly stitched but was able to remove with difficulties getting scissor under to cut but was successful after several attempts patient tolerated procedure well.area cleaned with betadine ad covered with Band Aid.   - advised to keep laceration site dry and clean  Apply bandage to right hand stitch  removal site for 3 days. Monitor hand for any redness or drainage.Notify provider if symptoms noted.  2. Visit for suture removal Suture removed as above.     Family/ staff Communication: Reviewed plan of care with patient  verbalized understanding  Labs/tests ordered: None   Next Appointment: As needed if symptoms worsen or fail to improve    Sandrea Hughs, NP

## 2021-04-01 ENCOUNTER — Other Ambulatory Visit: Payer: Self-pay | Admitting: Nurse Practitioner

## 2021-04-01 DIAGNOSIS — Z1231 Encounter for screening mammogram for malignant neoplasm of breast: Secondary | ICD-10-CM

## 2021-04-22 ENCOUNTER — Encounter: Payer: Self-pay | Admitting: Nurse Practitioner

## 2021-04-22 DIAGNOSIS — Z23 Encounter for immunization: Secondary | ICD-10-CM | POA: Diagnosis not present

## 2021-04-28 ENCOUNTER — Encounter: Payer: Self-pay | Admitting: Nurse Practitioner

## 2021-04-29 NOTE — Telephone Encounter (Signed)
Message routed to [name], NP to confirm. Medication is not on active medication list and no instructions were given on recent labs to restart atorvastatin

## 2021-05-02 ENCOUNTER — Ambulatory Visit
Admission: RE | Admit: 2021-05-02 | Discharge: 2021-05-02 | Disposition: A | Discharge status: Home / Self Care | Payer: Medicare Other | Source: Ambulatory Visit | Attending: Nurse Practitioner | Admitting: Nurse Practitioner

## 2021-05-02 DIAGNOSIS — Z1231 Encounter for screening mammogram for malignant neoplasm of breast: Secondary | ICD-10-CM | POA: Diagnosis not present

## 2021-05-16 ENCOUNTER — Other Ambulatory Visit: Payer: Self-pay | Admitting: Nurse Practitioner

## 2021-06-06 DIAGNOSIS — L720 Epidermal cyst: Secondary | ICD-10-CM | POA: Diagnosis not present

## 2021-06-06 DIAGNOSIS — L570 Actinic keratosis: Secondary | ICD-10-CM | POA: Diagnosis not present

## 2021-06-06 DIAGNOSIS — Z08 Encounter for follow-up examination after completed treatment for malignant neoplasm: Secondary | ICD-10-CM | POA: Diagnosis not present

## 2021-06-06 DIAGNOSIS — X32XXXD Exposure to sunlight, subsequent encounter: Secondary | ICD-10-CM | POA: Diagnosis not present

## 2021-06-06 DIAGNOSIS — B0089 Other herpesviral infection: Secondary | ICD-10-CM | POA: Diagnosis not present

## 2021-06-06 DIAGNOSIS — Z85820 Personal history of malignant melanoma of skin: Secondary | ICD-10-CM | POA: Diagnosis not present

## 2021-06-06 DIAGNOSIS — Z1283 Encounter for screening for malignant neoplasm of skin: Secondary | ICD-10-CM | POA: Diagnosis not present

## 2021-06-24 ENCOUNTER — Other Ambulatory Visit: Payer: Self-pay | Admitting: Nurse Practitioner

## 2021-07-08 ENCOUNTER — Other Ambulatory Visit: Payer: Self-pay | Admitting: Nurse Practitioner

## 2021-08-19 ENCOUNTER — Encounter: Payer: Self-pay | Admitting: Nurse Practitioner

## 2021-08-19 ENCOUNTER — Ambulatory Visit (INDEPENDENT_AMBULATORY_CARE_PROVIDER_SITE_OTHER): Payer: Medicare Other | Admitting: Nurse Practitioner

## 2021-08-19 ENCOUNTER — Other Ambulatory Visit: Payer: Self-pay

## 2021-08-19 VITALS — BP 122/80 | HR 93 | Temp 96.6°F | Ht 64.0 in | Wt 187.2 lb

## 2021-08-19 DIAGNOSIS — E785 Hyperlipidemia, unspecified: Secondary | ICD-10-CM | POA: Diagnosis not present

## 2021-08-19 DIAGNOSIS — K219 Gastro-esophageal reflux disease without esophagitis: Secondary | ICD-10-CM

## 2021-08-19 DIAGNOSIS — R748 Abnormal levels of other serum enzymes: Secondary | ICD-10-CM | POA: Diagnosis not present

## 2021-08-19 DIAGNOSIS — K5904 Chronic idiopathic constipation: Secondary | ICD-10-CM

## 2021-08-19 DIAGNOSIS — M159 Polyosteoarthritis, unspecified: Secondary | ICD-10-CM | POA: Diagnosis not present

## 2021-08-19 DIAGNOSIS — M150 Primary generalized (osteo)arthritis: Secondary | ICD-10-CM

## 2021-08-19 DIAGNOSIS — I10 Essential (primary) hypertension: Secondary | ICD-10-CM

## 2021-08-19 NOTE — Progress Notes (Signed)
? ? ?Careteam: ?Patient Care Team: ?[name], NP as PCP - General (Geriatric Medicine) ?Warden Fillers, MD as Consulting Physician (Ophthalmology) ? ?PLACE OF SERVICE:  ?[facility]  ?Advanced Directive information ?Does Patient Have a Medical Advance Directive?: Yes, Type of Advance Directive: [place];Living will, Does patient want to make changes to medical advance directive?: No - Patient declined ? ?No Known Allergies ? ?Chief Complaint  ?Patient presents with  ? Medical Management of Chronic Issues  ?  6 month follow-up. Patient was wondering if the Losartan was the medication she should take at night?   ? ? ? ?HPI: Patient is a 76 y.o. female for routine follow up.  ? ?mammogram was negative in December  ?After this she had a container fall and hit her left breast leaving a knot. No pain. Not becoming any larger.  ? ?Trying to do low fat diet, but increase in snacking.  ? ?GERD well controlled.  ? ?She was taken off statin due to worsening liver enzymes.  ? ?Htn- controlled on current regimen ? 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  ? ?Review of Systems:  ?Review of Systems  ?Constitutional:  Negative for chills, fever and weight loss.  ?HENT:  Negative for tinnitus.   ?Respiratory:  Negative for cough, sputum production and shortness of breath.   ?Cardiovascular:  Negative for chest pain, palpitations and leg swelling.  ?Gastrointestinal:  Negative for abdominal pain, constipation, diarrhea and heartburn.  ?Genitourinary:  Negative for dysuria, frequency and urgency.  ?Musculoskeletal:  Negative for back pain, falls, joint pain and myalgias.  ?Skin: Negative.   ?Neurological:  Negative for dizziness and headaches.  ?Psychiatric/Behavioral:  Negative for depression and memory loss. The patient does not have insomnia.   ? ?Past Medical History:  ?Diagnosis Date  ? Acid reflux   ? Bowel obstruction ([place])   ? COVID-19   ? H/O hiatal hernia   ? Hyperlipidemia    ? Hypertension   ? ?Past Surgical History:  ?Procedure Laterality Date  ? ABDOMINAL HYSTERECTOMY    ? APPENDECTOMY    ? BREAST CYST EXCISION Right   ? [id number]? benign  ? COLONOSCOPY  03/18/2012  ? Procedure: COLONOSCOPY;  Surgeon: Rogene Houston, MD;  [facility];  Service: Endoscopy;  Laterality: N/A;  930  ? LAPAROTOMY N/A 02/14/2013  ? Procedure: EXPLORATORY LAPAROTOMY;  Surgeon: Jamesetta So, MD;  Location: [facility];  Service: General;  Laterality: N/A;  ? MELANOMA EXCISION  03/2019  ? Dr.Hall, removed from [place]   ? SKIN SURGERY  02/2019  ? 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 Dr. [place] Crane, [place] Reichmann   ? ?Social History: ?  reports that she has never smoked. She has never used smokeless tobacco. She reports current alcohol use. She reports that she does not use drugs. ? ?Family History  ?Problem Relation Age of Onset  ? Cancer - Other Mother   ?     Kidney  ? Fibromyalgia Mother   ? Arthritis Mother   ? Cancer - Lung Father 43  ? Heart disease Brother 51  ?     had heart transplant then died 3 years later after restarting smoking  ? Lung disease Paternal Aunt   ?     Lung Mass  ? COPD Paternal Uncle   ? Leukemia Paternal Uncle   ? Stomach cancer Neg Hx   ? Esophageal cancer Neg Hx   ? Pancreatic cancer Neg Hx   ?  Rectal cancer Neg Hx   ? ? ?Medications: ?Patient's Medications  ?New Prescriptions  ? No medications on file  ?Previous Medications  ? AMLODIPINE (NORVASC) 5 MG TABLET    TAKE 1 TABLET DAILY  ? ASCORBIC ACID (VITAMIN C PO)    Take by mouth 2 (two) times daily.  ? ASPIRIN EC 81 MG TABLET    Take 81 mg by mouth daily.  ? BETA CAROTENE W/MINERALS (OCUVITE) TABLET    Take 1 tablet by mouth daily.  ? CALCIUM CARBONATE-VITAMIN D (CALCIUM 600 + D PO)    Take 1 tablet by mouth 2 (two) times daily.  ? CHOLECALCIFEROL (VITAMIN D3) 250 MCG (10000 UT) CAPSULE    Take 10,000 Units by mouth daily.  ? DOCUSATE SODIUM (COLACE) 100 MG CAPSULE    Take 100 mg by mouth 2 (two) times daily.  ? FAMOTIDINE (PEPCID) 20 MG  TABLET    Take 20 mg by mouth as needed for indigestion or heartburn.  ? FISH OIL-CHOLECALCIFEROL (FISH OIL + D3) 1000-1000 MG-UNIT CAPS    Take 1 capsule by mouth 2 (two) times daily.  ? LACTOBACILLUS-INULIN (CULTURELLE DIGESTIVE HEALTH PO)    Take 1 capsule by mouth daily.   ? LOSARTAN (COZAAR) 50 MG TABLET    TAKE 1 TABLET DAILY  ? MISC NATURAL PRODUCTS (OSTEO BI-FLEX ADV DOUBLE ST PO)    Take 1 tablet by mouth 2 (two) times daily.  ? MULTIPLE VITAMIN (MULTIVITAMIN WITH MINERALS) TABS    Take 1 tablet by mouth daily.  ? OMEPRAZOLE (PRILOSEC) 40 MG CAPSULE    TAKE 1 CAPSULE DAILY  ? POLYETHYLENE GLYCOL POWDER (GLYCOLAX/MIRALAX) POWDER    Take 17 g by mouth daily.  ? VITAMIN B-12 (CYANOCOBALAMIN) 1000 MCG TABLET    Take 1 tablet (1,000 mcg total) by mouth daily.  ? ZINC GLUCONATE 50 MG TABLET    Take 50 mg by mouth daily.  ?Modified Medications  ? No medications on file  ?Discontinued Medications  ? No medications on file  ? ? ?Physical Exam: ? ?Vitals:  ? 08/19/21 1034  ?BP: 122/80  ?Pulse: 93  ?Temp: (!) 96.6 ?F (35.9 ?C)  ?SpO2: 92%  ?Weight: 187 lb 3.2 oz (84.9 kg)  ?Height: _0  (1.626 m)  ? ?Body mass index is 32.13 kg/m?. ?Wt Readings from Last 3 Encounters:  ?08/19/21 187 lb 3.2 oz (84.9 kg)  ?03/28/21 186 lb 9.6 oz (84.6 kg)  ?03/18/21 187 lb (84.8 kg)  ? ? ?Physical Exam ?Constitutional:   ?   General: She is not in acute distress. ?   Appearance: She is well-developed. She is not diaphoretic.  ?HENT:  ?   Head: Normocephalic and atraumatic.  ?   Mouth/Throat:  ?   Pharynx: No oropharyngeal exudate.  ?Eyes:  ?   Conjunctiva/sclera: Conjunctivae normal.  ?   Pupils: Pupils are equal, round, and reactive to light.  ?Cardiovascular:  ?   Rate and Rhythm: Normal rate and regular rhythm.  ?   Heart sounds: Normal heart sounds.  ?Pulmonary:  ?   Effort: Pulmonary effort is normal.  ?   Breath sounds: Normal breath sounds.  ?Chest:  ?   Chest wall: No mass.  ?Breasts: ?   Left: Normal. No mass.  ?Abdominal:  ?    General: Bowel sounds are normal.  ?   Palpations: Abdomen is soft.  ?Musculoskeletal:  ?   Cervical back: Normal range of motion and neck supple.  ?   Right  lower leg: No edema.  ?   Left lower leg: No edema.  ?Skin: ?   General: Skin is warm and dry.  ?Neurological:  ?   Mental Status: She is alert.  ?Psychiatric:     ?   Mood and Affect: Mood normal.  ? ?Labs reviewed: ?Basic Metabolic Panel: ?Recent Labs  ?  02/08/21 ?0809 03/26/21 ?0805  ?NA 142 140  ?K 4.0 4.0  ?CL 107 106  ?CO2 26 27  ?GLUCOSE 88 92  ?BUN 12 9  ?CREATININE 0.65 0.59*  ?CALCIUM 9.2 9.1  ? ?Liver Function Tests: ?Recent Labs  ?  02/08/21 ?0809 03/26/21 ?0805  ?AST 53* 45*  ?ALT 67* 65*  ?BILITOT 0.8 0.6  ?PROT 6.5 6.3  ? ?No results for input(s): LIPASE, AMYLASE in the last 8760 hours. ?No results for input(s): AMMONIA in the last 8760 hours. ?CBC: ?Recent Labs  ?  02/08/21 ?0809  ?WBC 6.2  ?NEUTROABS 2,914  ?HGB 14.7  ?HCT 43.9  ?MCV 95.4  ?PLT 239  ? ?Lipid Panel: ?Recent Labs  ?  02/08/21 ?0809  ?CHOL 111  ?HDL 42*  ?[place] 44  ?TRIG 172*  ?CHOLHDL 2.6  ? ?TSH: ?No results for input(s): TSH in the last 8760 hours. ?A1C: ?Lab Results  ?Component Value Date  ? HGBA1C 5.6 03/21/2019  ? ? ? ?Assessment/Plan ?1. Essential hypertension ?-Blood pressure well controlled ?Continue current medications ?Recheck metabolic panel ?- CMP with eGFR([facility]) ?- CBC with Differential/Platelet ? ?2. Hyperlipidemia, unspecified hyperlipidemia type ?-off statin due to elevated liver enzymes, will follow up today ?- CMP with eGFR([facility]) ?- Lipid panel ? ?3. Gastroesophageal reflux disease without esophagitis ?-stable on omeprazole.  ? ?4. Primary osteoarthritis involving multiple joints ?The current medical regimen is effective;  continue present plan and medications. ?Continue exercise and strength training.  ? ?5. Elevated liver enzymes ?Follow up today ? ?6. Chronic idiopathic constipation ?-well managed on current regimen ? ? ?Return in about 6 months (around  02/19/2022) for routine follow up, . ?[name] [name]. Dewaine Oats, AGNP ? ?[facility] ?[phone]  ?

## 2021-08-20 ENCOUNTER — Other Ambulatory Visit: Payer: Self-pay | Admitting: Nurse Practitioner

## 2021-08-20 ENCOUNTER — Encounter: Payer: Self-pay | Admitting: Nurse Practitioner

## 2021-08-20 DIAGNOSIS — R748 Abnormal levels of other serum enzymes: Secondary | ICD-10-CM

## 2021-08-20 LAB — COMPLETE METABOLIC PANEL WITH GFR
AG Ratio: 1.6 (calc) (ref 1.0–2.5)
ALT: 88 U/L — ABNORMAL HIGH (ref 6–29)
AST: 73 U/L — ABNORMAL HIGH (ref 10–35)
Albumin: 4.2 g/dL (ref 3.6–5.1)
Alkaline phosphatase (APISO): 88 U/L (ref 37–153)
BUN: 10 mg/dL (ref 7–25)
CO2: 27 mmol/L (ref 20–32)
Calcium: 9.7 mg/dL (ref 8.6–10.4)
Chloride: 104 mmol/L (ref 98–110)
Creat: 0.71 mg/dL (ref 0.60–1.00)
Globulin: 2.7 g/dL (calc) (ref 1.9–3.7)
Glucose, Bld: 85 mg/dL (ref 65–99)
Potassium: 4.7 mmol/L (ref 3.5–5.3)
Sodium: 141 mmol/L (ref 135–146)
Total Bilirubin: 0.8 mg/dL (ref 0.2–1.2)
Total Protein: 6.9 g/dL (ref 6.1–8.1)
eGFR: 89 mL/min/{1.73_m2} (ref >=60)

## 2021-08-20 LAB — LIPID PANEL
Cholesterol: 179 mg/dL (ref <200)
HDL: 41 mg/dL — ABNORMAL LOW (ref >=50)
LDL Cholesterol (Calc): 101 mg/dL (calc) — ABNORMAL HIGH
Non-HDL Cholesterol (Calc): 138 mg/dL (calc) — ABNORMAL HIGH (ref <130)
Total CHOL/HDL Ratio: 4.4 (calc) (ref <5.0)
Triglycerides: 243 mg/dL — ABNORMAL HIGH (ref <150)

## 2021-08-20 LAB — CBC WITH DIFFERENTIAL/PLATELET
Absolute Monocytes: 688 cells/uL (ref 200–950)
Basophils Absolute: 111 cells/uL (ref 0–200)
Basophils Relative: 1.5 %
Eosinophils Absolute: 118 cells/uL (ref 15–500)
Eosinophils Relative: 1.6 %
HCT: 45 % (ref 35.0–45.0)
Hemoglobin: 15.3 g/dL (ref 11.7–15.5)
Lymphs Abs: 2679 cells/uL (ref 850–3900)
MCH: 31.9 pg (ref 27.0–33.0)
MCHC: 34 g/dL (ref 32.0–36.0)
MCV: 93.9 fL (ref 80.0–100.0)
MPV: 12.7 fL — ABNORMAL HIGH (ref 7.5–12.5)
Monocytes Relative: 9.3 %
Neutro Abs: 3804 cells/uL (ref 1500–7800)
Neutrophils Relative %: 51.4 %
Platelets: 272 10*3/uL (ref 140–400)
RBC: 4.79 10*6/uL (ref 3.80–5.10)
RDW: 12.2 % (ref 11.0–15.0)
Total Lymphocyte: 36.2 %
WBC: 7.4 10*3/uL (ref 3.8–10.8)

## 2021-08-20 NOTE — Telephone Encounter (Signed)
In agreement to your recommendations.  ?

## 2021-08-29 ENCOUNTER — Ambulatory Visit
Admission: RE | Admit: 2021-08-29 | Discharge: 2021-08-29 | Disposition: A | Discharge status: Home / Self Care | Payer: Medicare Other | Source: Ambulatory Visit | Attending: Nurse Practitioner | Admitting: Nurse Practitioner

## 2021-08-29 DIAGNOSIS — K760 Fatty (change of) liver, not elsewhere classified: Secondary | ICD-10-CM | POA: Diagnosis not present

## 2021-08-29 DIAGNOSIS — R748 Abnormal levels of other serum enzymes: Secondary | ICD-10-CM

## 2021-08-29 DIAGNOSIS — R7989 Other specified abnormal findings of blood chemistry: Secondary | ICD-10-CM | POA: Diagnosis not present

## 2021-10-03 DIAGNOSIS — Z20822 Contact with and (suspected) exposure to covid-19: Secondary | ICD-10-CM | POA: Diagnosis not present

## 2021-10-27 ENCOUNTER — Other Ambulatory Visit: Payer: Self-pay | Admitting: Nurse Practitioner

## 2021-10-30 ENCOUNTER — Encounter: Payer: Self-pay | Admitting: Nurse Practitioner

## 2021-12-05 DIAGNOSIS — Z08 Encounter for follow-up examination after completed treatment for malignant neoplasm: Secondary | ICD-10-CM | POA: Diagnosis not present

## 2021-12-05 DIAGNOSIS — Z1283 Encounter for screening for malignant neoplasm of skin: Secondary | ICD-10-CM | POA: Diagnosis not present

## 2021-12-05 DIAGNOSIS — Z85820 Personal history of malignant melanoma of skin: Secondary | ICD-10-CM | POA: Diagnosis not present

## 2022-01-13 ENCOUNTER — Other Ambulatory Visit: Payer: Self-pay | Admitting: Nurse Practitioner

## 2022-02-13 IMAGING — MG MM DIGITAL DIAGNOSTIC UNILAT*L* W/ TOMO W/ CAD
6 series · 6 of 18 positions shown · non-contrast
Comparison: Previous exam(s).

CLINICAL DATA: Patient presents for palpable mass within the medial
left breast with overlying skin redness. Patient does not remember a
specific history of trauma to this location.

EXAM:
DIGITAL DIAGNOSTIC LEFT MAMMOGRAM WITH CAD AND TOMO
ULTRASOUND LEFT BREAST

[L CC synth-2D]
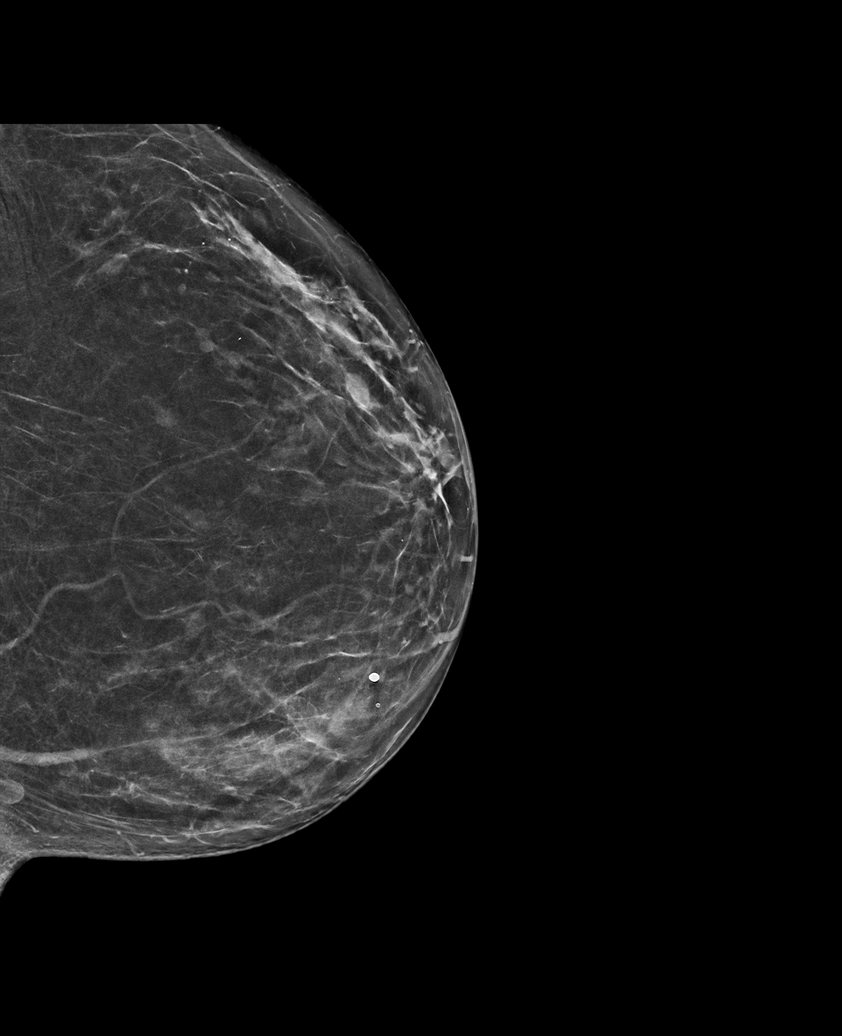

[L MLO synth-2D]
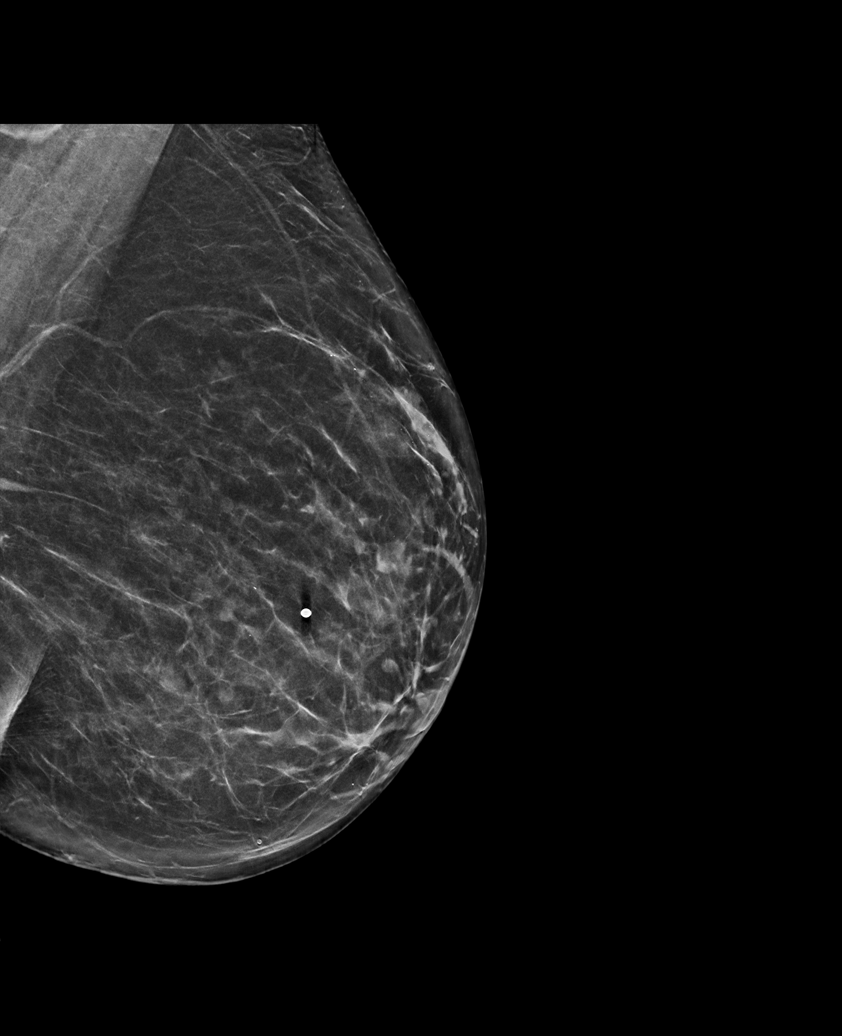

[L TAN synth-2D]
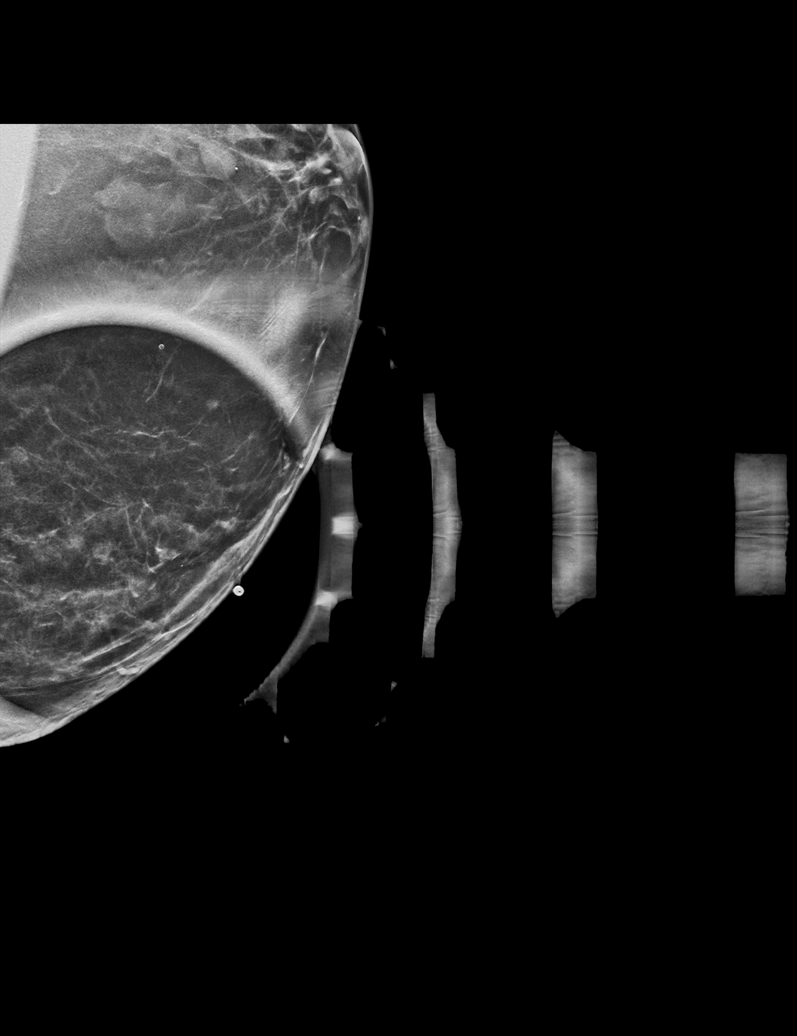

[L CC tomo · tomo slice 33/66.0]
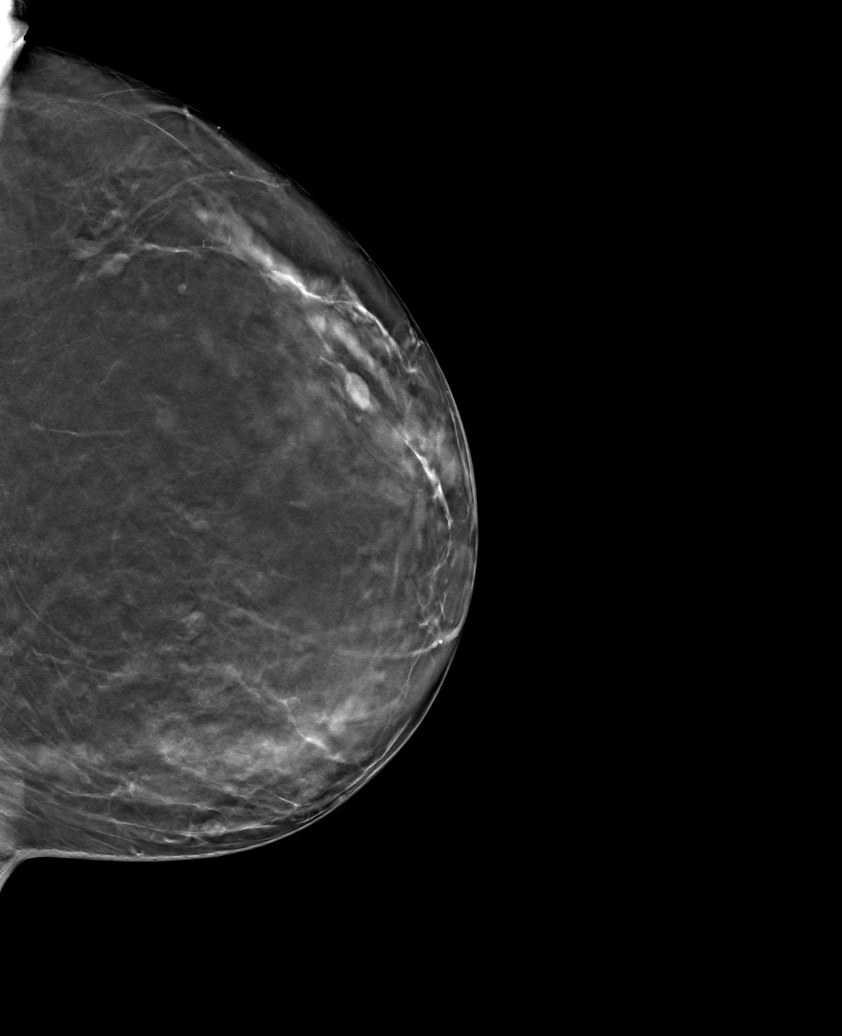

[L MLO tomo · tomo slice 36/71.0]
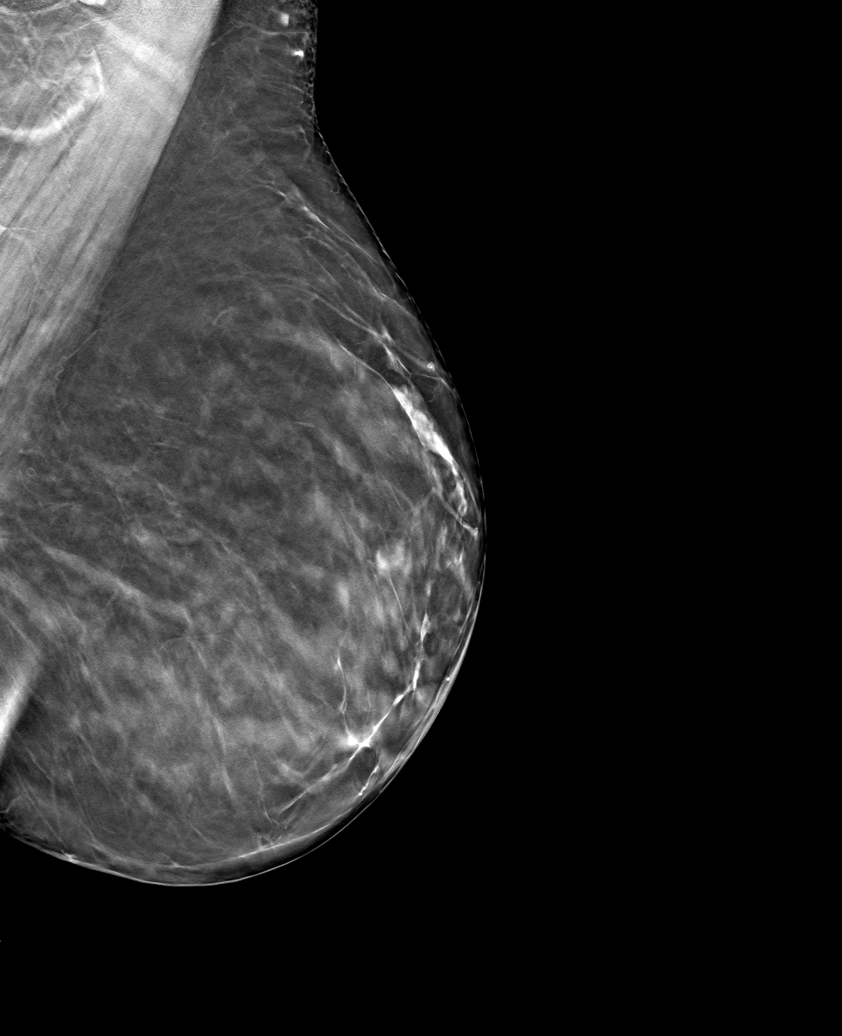

[L TAN tomo · tomo slice 28/55.0]
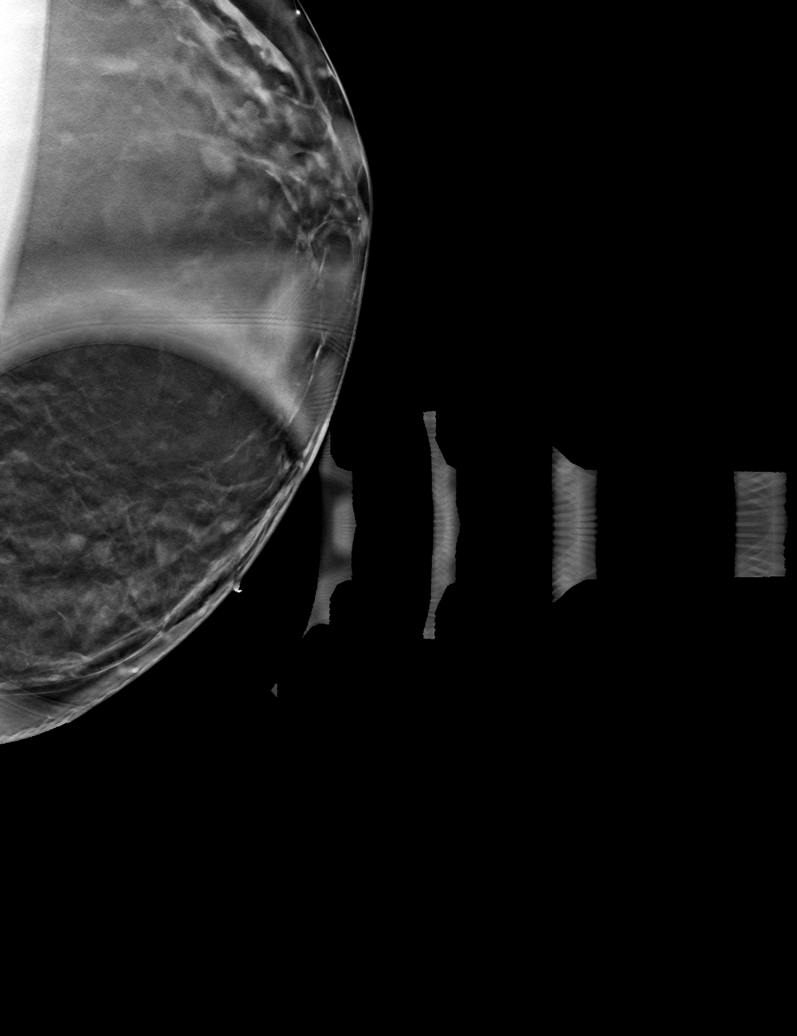

[6 of 18 positions shown; findings below may reference images not displayed]

ACR Breast Density Category b: There are scattered areas of
fibroglandular density.
FINDINGS: Within the medial left breast underlying the palpable marker there
is a large vague area of increased density with associated oil
cysts. No additional new masses, calcifications or nonsurgical
distortion identified within the left breast.

Mammographic images were processed with CAD.

On physical exam, there is a small amount of cutaneous redness
overlying the medial left breast.

Targeted ultrasound is performed, showing a vague mixed echogenicity
region within the left breast 10 o'clock position 4 cm from nipple
underlying the site of palpable concern. Multiple small cysts and
cystic changes demonstrated within this area.
IMPRESSION: Palpable abnormality within the medial left breast favored to
represent a moderate sized area of fat necrosis. Possibility of
underlying infection is not entirely excluded however not favored.

RECOMMENDATION:
Patient was instructed to return in 1 month for repeat left
mammogram and possible ultrasound to assess for interval evolution
of suspected area of fat necrosis involving the medial left breast.
Patient was instructed to call the office and return sooner should
she have worsening symptoms which would be more suggestive of an
infectious process at which point she will be started on
antibiotics.

I have discussed the findings and recommendations with the patient.
If applicable, a reminder letter will be sent to the patient
regarding the next appointment.

BI-RADS CATEGORY  3: Probably benign.

## 2022-02-13 IMAGING — US US BREAST*L* LIMITED INC AXILLA
1 series · 3 of 3 positions shown · non-contrast
Comparison: Previous exam(s).

CLINICAL DATA: Patient presents for palpable mass within the medial
left breast with overlying skin redness. Patient does not remember a
specific history of trauma to this location.

EXAM:
DIGITAL DIAGNOSTIC LEFT MAMMOGRAM WITH CAD AND TOMO
ULTRASOUND LEFT BREAST

[Series 1: us breast*left* limited inc axilla · 0.07mm/px · 3 of 3 slices shown]
[im 1/3]
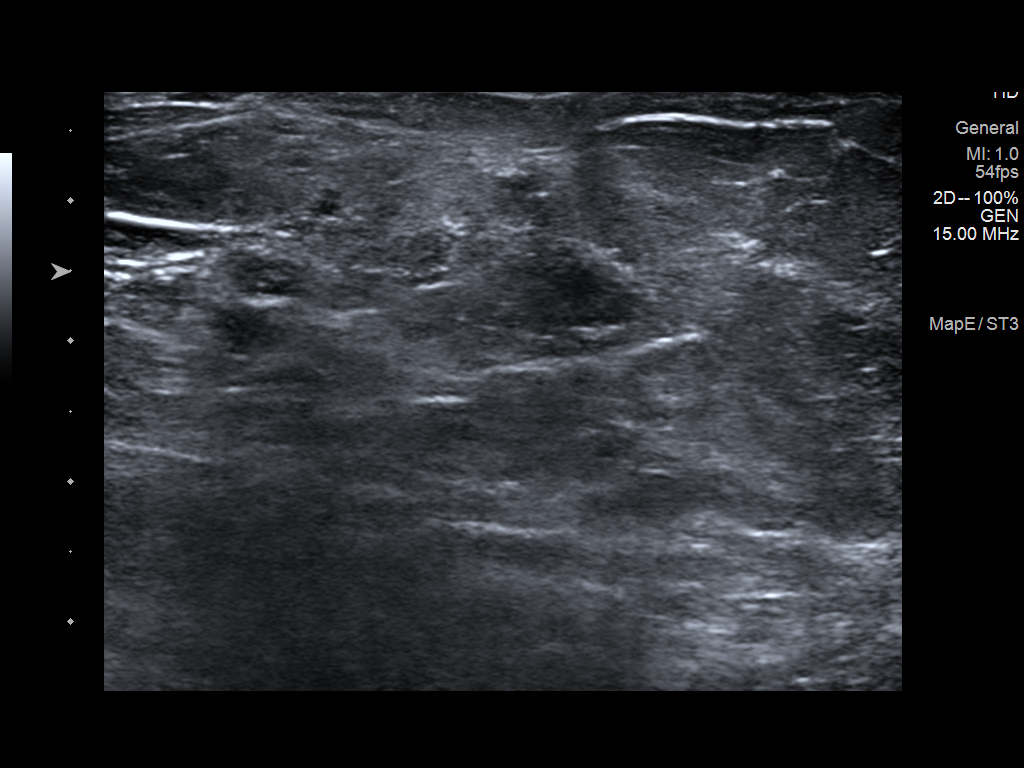
[im 2/3]
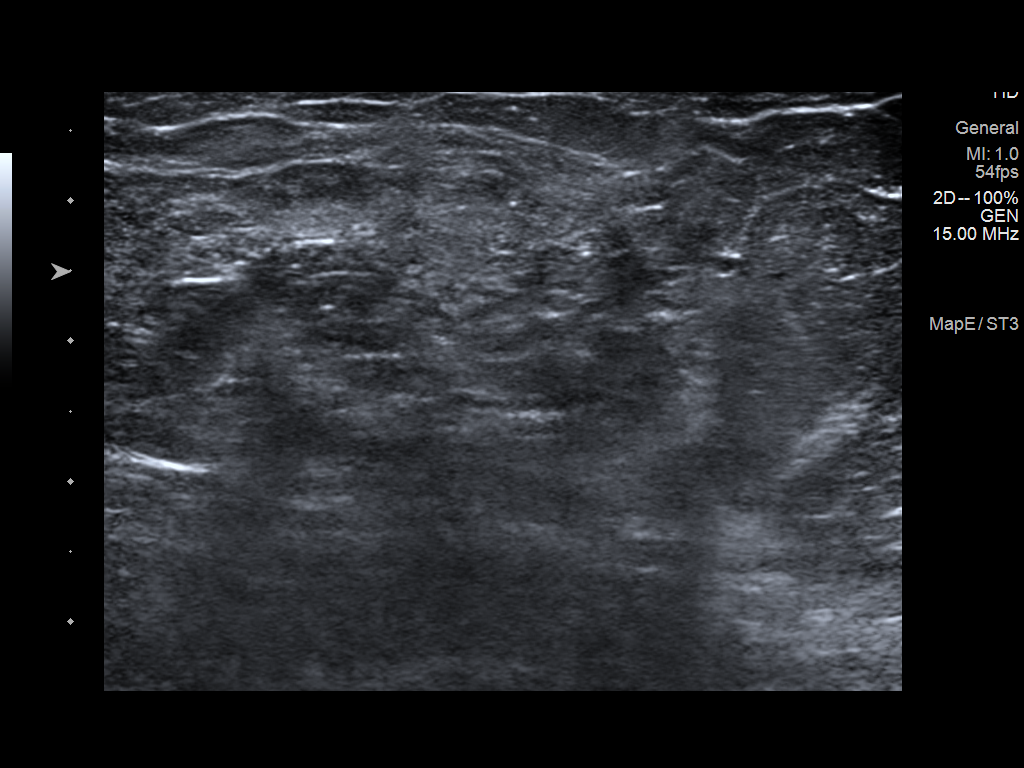
[im 3/3]
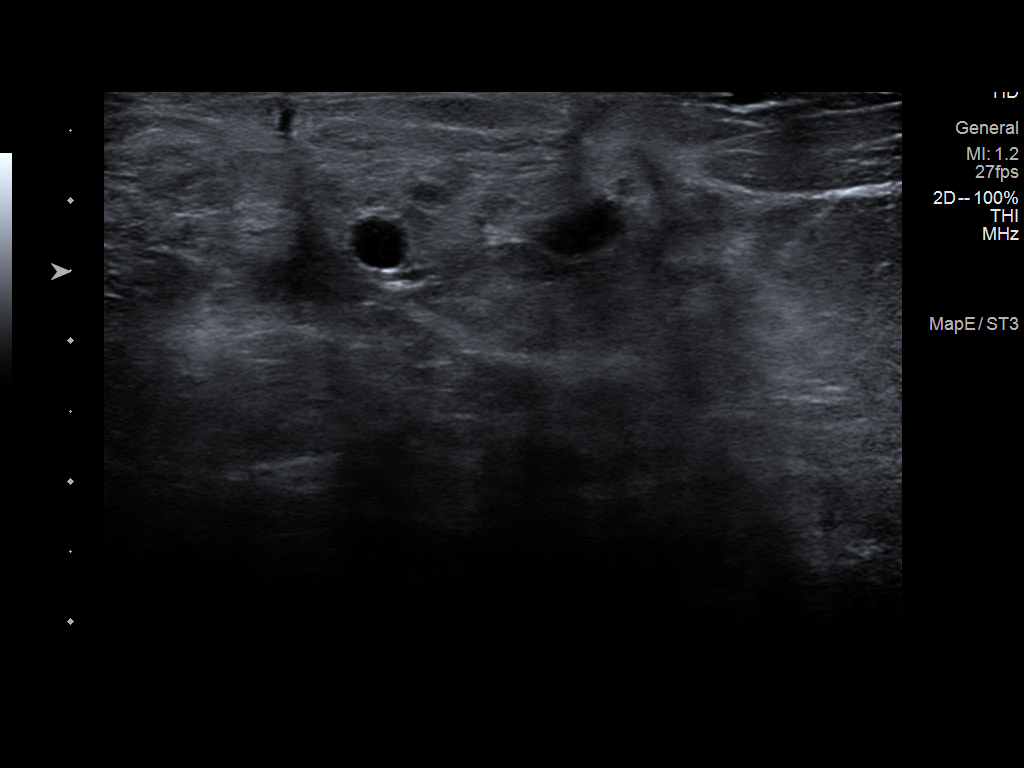

[3 of 3 positions shown; findings below may reference images not displayed]

ACR Breast Density Category b: There are scattered areas of
fibroglandular density.
FINDINGS: Within the medial left breast underlying the palpable marker there
is a large vague area of increased density with associated oil
cysts. No additional new masses, calcifications or nonsurgical
distortion identified within the left breast.

Mammographic images were processed with CAD.

On physical exam, there is a small amount of cutaneous redness
overlying the medial left breast.

Targeted ultrasound is performed, showing a vague mixed echogenicity
region within the left breast 10 o'clock position 4 cm from nipple
underlying the site of palpable concern. Multiple small cysts and
cystic changes demonstrated within this area.
IMPRESSION: Palpable abnormality within the medial left breast favored to
represent a moderate sized area of fat necrosis. Possibility of
underlying infection is not entirely excluded however not favored.

RECOMMENDATION:
Patient was instructed to return in 1 month for repeat left
mammogram and possible ultrasound to assess for interval evolution
of suspected area of fat necrosis involving the medial left breast.
Patient was instructed to call the office and return sooner should
she have worsening symptoms which would be more suggestive of an
infectious process at which point she will be started on
antibiotics.

I have discussed the findings and recommendations with the patient.
If applicable, a reminder letter will be sent to the patient
regarding the next appointment.

BI-RADS CATEGORY  3: Probably benign.

## 2022-02-21 ENCOUNTER — Ambulatory Visit: Payer: Medicare Other | Admitting: Nurse Practitioner

## 2022-02-24 ENCOUNTER — Encounter: Payer: Self-pay | Admitting: Nurse Practitioner

## 2022-02-24 ENCOUNTER — Ambulatory Visit (INDEPENDENT_AMBULATORY_CARE_PROVIDER_SITE_OTHER): Payer: Medicare Other | Admitting: Nurse Practitioner

## 2022-02-24 ENCOUNTER — Encounter (INDEPENDENT_AMBULATORY_CARE_PROVIDER_SITE_OTHER): Payer: Self-pay | Admitting: *Deleted

## 2022-02-24 VITALS — BP 130/80 | HR 68 | Temp 97.7°F | Ht 64.0 in | Wt 187.6 lb

## 2022-02-24 DIAGNOSIS — Z23 Encounter for immunization: Secondary | ICD-10-CM

## 2022-02-24 DIAGNOSIS — E785 Hyperlipidemia, unspecified: Secondary | ICD-10-CM | POA: Diagnosis not present

## 2022-02-24 DIAGNOSIS — K219 Gastro-esophageal reflux disease without esophagitis: Secondary | ICD-10-CM

## 2022-02-24 DIAGNOSIS — K5904 Chronic idiopathic constipation: Secondary | ICD-10-CM | POA: Diagnosis not present

## 2022-02-24 DIAGNOSIS — I10 Essential (primary) hypertension: Secondary | ICD-10-CM

## 2022-02-24 DIAGNOSIS — R748 Abnormal levels of other serum enzymes: Secondary | ICD-10-CM | POA: Diagnosis not present

## 2022-02-24 NOTE — Progress Notes (Signed)
Careteam: Patient Care Team: [name], NP as PCP - General (Geriatric Medicine) Warden Fillers, MD as Consulting Physician (Ophthalmology)  PLACE OF SERVICE:  [place] [facility] Directive information    No Known Allergies  Chief Complaint  Patient presents with   Medical Management of Chronic Issues    Patient presents today for a 6 month follow-up   Quality Metric Gaps    COVID#6     HPI: Patient is a 76 y.o. female for routine follow up.   She has been doing well.  Plans to get flu shot today and COVID booster.   She had a fall when her step stool broke also slipped and fell on the side walk- no injury  with either fall but had a bruise when she fell on sidewalk.   She is loving volunteering at [employer].  Does exercises daily   Constipation- controlled on miralax  Review of Systems:  Review of Systems  Constitutional:  Negative for chills, fever and weight loss.  HENT:  Negative for tinnitus.   Respiratory:  Negative for cough, sputum production and shortness of breath.   Cardiovascular:  Negative for chest pain, palpitations and leg swelling.  Gastrointestinal:  Negative for abdominal pain, constipation, diarrhea and heartburn.  Genitourinary:  Negative for dysuria, frequency and urgency.  Musculoskeletal:  Negative for back pain, falls, joint pain and myalgias.  Skin: Negative.   Neurological:  Negative for dizziness and headaches.  Psychiatric/Behavioral:  Negative for depression and memory loss. The patient does not have insomnia.     Past Medical History:  Diagnosis Date   Acid reflux    Bowel obstruction ([place])    COVID-19    H/O hiatal hernia    Hyperlipidemia    Hypertension    Past Surgical History:  Procedure Laterality Date   ABDOMINAL HYSTERECTOMY     APPENDECTOMY     BREAST CYST EXCISION Right    [phone]? benign   COLONOSCOPY  03/18/2012   Procedure: COLONOSCOPY;  Surgeon: Rogene Houston, MD;  Location: [facility];  Service: Endoscopy;  Laterality: N/A;  930   LAPAROTOMY N/A 02/14/2013   Procedure: EXPLORATORY LAPAROTOMY;  Surgeon: Jamesetta So, MD;  Location: [facility];  Service: General;  Laterality: N/A;   MELANOMA EXCISION  03/2019   Dr.Hall, removed from [place]  02/2019   Melanoma removed from upper back. Dr. Nevada Crane, Jenny Reichmann    Social History:   reports that she has never smoked. She has never used smokeless tobacco. She reports current alcohol use. She reports that she does not use drugs.  Family History  Problem Relation Age of Onset   Cancer - Other Mother        Kidney   Fibromyalgia Mother    Arthritis Mother    Cancer - Lung Father 51   Heart disease Brother 33       had heart transplant then died 3 years later after restarting smoking   Lung disease Paternal Aunt        Lung Mass   COPD Paternal Uncle    Leukemia Paternal Uncle    Stomach cancer Neg Hx    Esophageal cancer Neg Hx    Pancreatic cancer Neg Hx    Rectal cancer Neg Hx     Medications: Patient's Medications  New Prescriptions   No medications on file  Previous Medications   AMLODIPINE (NORVASC) 5 MG TABLET    TAKE 1  TABLET DAILY   ASCORBIC ACID (VITAMIN C PO)    Take by mouth 2 (two) times daily.   ASPIRIN EC 81 MG TABLET    Take 81 mg by mouth daily.   BETA CAROTENE W/MINERALS (OCUVITE) TABLET    Take 1 tablet by mouth daily.   CALCIUM CARBONATE-VITAMIN D (CALCIUM 600 + D PO)    Take 1 tablet by mouth 2 (two) times daily.   CHOLECALCIFEROL (VITAMIN D3) 250 MCG (10000 UT) CAPSULE    Take 10,000 Units by mouth daily.   DOCUSATE SODIUM (COLACE) 100 MG CAPSULE    Take 100 mg by mouth 2 (two) times daily.   FAMOTIDINE (PEPCID) 20 MG TABLET    Take 20 mg by mouth as needed for indigestion or heartburn.   FISH OIL-CHOLECALCIFEROL (FISH OIL + D3) 1000-1000 MG-UNIT CAPS    Take 1 capsule by mouth 2 (two) times daily.   LACTOBACILLUS-INULIN (CULTURELLE DIGESTIVE HEALTH PO)    Take 1 capsule by mouth  daily.    LOSARTAN (COZAAR) 50 MG TABLET    TAKE 1 TABLET DAILY   MISC NATURAL PRODUCTS (OSTEO BI-FLEX ADV DOUBLE ST PO)    Take 1 tablet by mouth 2 (two) times daily.   MULTIPLE VITAMIN (MULTIVITAMIN WITH MINERALS) TABS    Take 1 tablet by mouth daily.   OMEPRAZOLE (PRILOSEC) 40 MG CAPSULE    TAKE 1 CAPSULE DAILY   POLYETHYLENE GLYCOL POWDER (GLYCOLAX/MIRALAX) POWDER    Take 17 g by mouth daily.   VITAMIN B-12 (CYANOCOBALAMIN) 1000 MCG TABLET    Take 1 tablet (1,000 mcg total) by mouth daily.   ZINC GLUCONATE 50 MG TABLET    Take 50 mg by mouth daily.  Modified Medications   No medications on file  Discontinued Medications   No medications on file    Physical Exam:  Vitals:   02/24/22 1302  BP: 130/80  Pulse: 68  Temp: 97.7 F (36.5 C)  SpO2: 96%  Weight: 187 lb 9.6 oz (85.1 kg)  Height: '5\' 4"'  (1.626 m)   Body mass index is 32.2 kg/m. Wt Readings from Last 3 Encounters:  02/24/22 187 lb 9.6 oz (85.1 kg)  08/19/21 187 lb 3.2 oz (84.9 kg)  03/28/21 186 lb 9.6 oz (84.6 kg)    Physical Exam Constitutional:      General: She is not in acute distress.    Appearance: She is well-developed. She is not diaphoretic.  HENT:     Head: Normocephalic and atraumatic.     Mouth/Throat:     Pharynx: No oropharyngeal exudate.  Eyes:     Conjunctiva/sclera: Conjunctivae normal.     Pupils: Pupils are equal, round, and reactive to light.  Cardiovascular:     Rate and Rhythm: Normal rate and regular rhythm.     Heart sounds: Normal heart sounds.  Pulmonary:     Effort: Pulmonary effort is normal.     Breath sounds: Normal breath sounds.  Abdominal:     General: Bowel sounds are normal.     Palpations: Abdomen is soft.  Musculoskeletal:     Cervical back: Normal range of motion and neck supple.     Right lower leg: No edema.     Left lower leg: No edema.  Skin:    General: Skin is warm and dry.  Neurological:     Mental Status: She is alert.  Psychiatric:        Mood and  Affect: Mood normal.  Labs reviewed: Basic Metabolic Panel: Recent Labs    03/26/21 0805 08/19/21 1330  NA 140 141  K 4.0 4.7  CL 106 104  CO2 27 27  GLUCOSE 92 85  BUN 9 10  CREATININE 0.59* 0.71  CALCIUM 9.1 9.7   Liver Function Tests: Recent Labs    03/26/21 0805 08/19/21 1330  AST 45* 73*  ALT 65* 88*  BILITOT 0.6 0.8  PROT 6.3 6.9   No results for input(s): "LIPASE", "AMYLASE" in the last 8760 hours. No results for input(s): "AMMONIA" in the last 8760 hours. CBC: Recent Labs    08/19/21 1330  WBC 7.4  NEUTROABS 3,804  HGB 15.3  HCT 45.0  MCV 93.9  PLT 272   Lipid Panel: Recent Labs    08/19/21 1330  CHOL 179  HDL 41*  LDLCALC 101*  TRIG 243*  CHOLHDL 4.4   TSH: No results for input(s): "TSH" in the last 8760 hours. A1C: Lab Results  Component Value Date   HGBA1C 5.6 03/21/2019     Assessment/Plan 1. Need for influenza vaccination - Flu Vaccine QUAD High Dose(Fluad)  2. Hyperlipidemia, unspecified hyperlipidemia type -off statin due to elevated LFT, will follow up at this time.  - Lipid panel  3. Elevated liver enzymes -hepatic steatosis noted on ultrasound. -low fat diet recommended - CMP with eGFR([facility])  4. Gastroesophageal reflux disease without esophagitis -controlled on pepcid  5. Essential hypertension -Blood pressure well controlled, goal bp <140/90 Continue current medications and dietary modifications follow metabolic panel - CMP with eGFR([facility]) - CBC with Differential/Platelet  6. Chronic idiopathic constipation -stable.  Continue OTC Miralax  Return in about 6 months (around 08/25/2022) for routine follow up . [employer]. [place], [facility] [phone]

## 2022-02-25 ENCOUNTER — Encounter: Payer: Self-pay | Admitting: Nurse Practitioner

## 2022-02-25 ENCOUNTER — Other Ambulatory Visit: Payer: Self-pay | Admitting: Nurse Practitioner

## 2022-02-25 DIAGNOSIS — K760 Fatty (change of) liver, not elsewhere classified: Secondary | ICD-10-CM

## 2022-02-25 DIAGNOSIS — R748 Abnormal levels of other serum enzymes: Secondary | ICD-10-CM

## 2022-02-25 LAB — LIPID PANEL
Cholesterol: 170 mg/dL (ref <200)
HDL: 39 mg/dL — ABNORMAL LOW (ref >=50)
LDL Cholesterol (Calc): 97 mg/dL (calc)
Non-HDL Cholesterol (Calc): 131 mg/dL (calc) — ABNORMAL HIGH (ref <130)
Total CHOL/HDL Ratio: 4.4 (calc) (ref <5.0)
Triglycerides: 223 mg/dL — ABNORMAL HIGH (ref <150)

## 2022-02-25 LAB — COMPLETE METABOLIC PANEL WITH GFR
AG Ratio: 1.4 (calc) (ref 1.0–2.5)
ALT: 88 U/L — ABNORMAL HIGH (ref 6–29)
AST: 86 U/L — ABNORMAL HIGH (ref 10–35)
Albumin: 4 g/dL (ref 3.6–5.1)
Alkaline phosphatase (APISO): 77 U/L (ref 37–153)
BUN: 11 mg/dL (ref 7–25)
CO2: 26 mmol/L (ref 20–32)
Calcium: 9.3 mg/dL (ref 8.6–10.4)
Chloride: 103 mmol/L (ref 98–110)
Creat: 0.61 mg/dL (ref 0.60–1.00)
Globulin: 2.8 g/dL (calc) (ref 1.9–3.7)
Glucose, Bld: 79 mg/dL (ref 65–139)
Potassium: 4.3 mmol/L (ref 3.5–5.3)
Sodium: 140 mmol/L (ref 135–146)
Total Bilirubin: 0.8 mg/dL (ref 0.2–1.2)
Total Protein: 6.8 g/dL (ref 6.1–8.1)
eGFR: 93 mL/min/{1.73_m2} (ref >=60)

## 2022-02-25 LAB — CBC WITH DIFFERENTIAL/PLATELET
Absolute Monocytes: 888 cells/uL (ref 200–950)
Basophils Absolute: 72 cells/uL (ref 0–200)
Basophils Relative: 0.9 %
Eosinophils Absolute: 152 cells/uL (ref 15–500)
Eosinophils Relative: 1.9 %
HCT: 44.3 % (ref 35.0–45.0)
Hemoglobin: 15.1 g/dL (ref 11.7–15.5)
Lymphs Abs: 3040 cells/uL (ref 850–3900)
MCH: 32.6 pg (ref 27.0–33.0)
MCHC: 34.1 g/dL (ref 32.0–36.0)
MCV: 95.7 fL (ref 80.0–100.0)
MPV: 12.6 fL — ABNORMAL HIGH (ref 7.5–12.5)
Monocytes Relative: 11.1 %
Neutro Abs: 3848 cells/uL (ref 1500–7800)
Neutrophils Relative %: 48.1 %
Platelets: 265 10*3/uL (ref 140–400)
RBC: 4.63 10*6/uL (ref 3.80–5.10)
RDW: 12.3 % (ref 11.0–15.0)
Total Lymphocyte: 38 %
WBC: 8 10*3/uL (ref 3.8–10.8)

## 2022-02-25 NOTE — Telephone Encounter (Signed)
I placed the referral for her GI- can you let the referral team know her preference   This is the Gi that I [name] [phone]   [name] [name]

## 2022-02-26 ENCOUNTER — Encounter (INDEPENDENT_AMBULATORY_CARE_PROVIDER_SITE_OTHER): Payer: Self-pay | Admitting: *Deleted

## 2022-03-07 ENCOUNTER — Ambulatory Visit (INDEPENDENT_AMBULATORY_CARE_PROVIDER_SITE_OTHER): Payer: Medicare Other | Admitting: Nurse Practitioner

## 2022-03-07 ENCOUNTER — Encounter: Payer: Self-pay | Admitting: Nurse Practitioner

## 2022-03-07 DIAGNOSIS — Z0000 Encounter for general adult medical examination without abnormal findings: Secondary | ICD-10-CM | POA: Diagnosis not present

## 2022-03-07 NOTE — Patient Instructions (Signed)
[name] [name] , Thank you for taking time to come for your Medicare Wellness Visit. I appreciate your ongoing commitment to your health goals. Please review the following plan we discussed and let me know if I can assist you in the future.   Screening recommendations/referrals: Colonoscopy Due at this time Mammogram up to date Bone Density up to date Recommended yearly ophthalmology/optometry visit for glaucoma screening and checkup Recommended yearly dental visit for hygiene and checkup  Vaccinations: Influenza vaccine- due annually in September/October Pneumococcal vaccine up to date Tdap vaccine up to date Shingles vaccine up to date    Advanced directives: on file.   Conditions/risks identified: advanced age.  Next appointment: yearly- next in person.    Preventive Care 75 Years and Older, Female Preventive care refers to lifestyle choices and visits with your health care provider that can promote health and wellness. What does preventive care include? A yearly physical exam. This is also called an annual well check. Dental exams once or twice a year. Routine eye exams. Ask your health care provider how often you should have your eyes checked. Personal lifestyle choices, including: Daily care of your teeth and gums. Regular physical activity. Eating a healthy diet. Avoiding tobacco and drug use. Limiting alcohol use. Practicing safe sex. Taking low-dose aspirin every day. Taking vitamin and mineral supplements as recommended by your health care provider. What happens during an annual well check? The services and screenings done by your health care provider during your annual well check will depend on your age, overall health, lifestyle risk factors, and family history of disease. Counseling  Your health care provider may ask you questions about your: Alcohol use. Tobacco use. Drug use. Emotional well-being. Home and relationship well-being. Sexual  activity. Eating habits. History of falls. Memory and ability to understand (cognition). Work and work Statistician. Reproductive health. Screening  You may have the following tests or measurements: Height, weight, and BMI. Blood pressure. Lipid and cholesterol levels. These may be checked every 5 years, or more frequently if you are over 75 years old. Skin check. Lung cancer screening. You may have this screening every year starting at age 75 if you have a 30-pack-year history of smoking and currently smoke or have quit within the past 15 years. Fecal occult blood test (FOBT) of the stool. You may have this test every year starting at age 75. Flexible sigmoidoscopy or colonoscopy. You may have a sigmoidoscopy every 5 years or a colonoscopy every 10 years starting at age 75. Hepatitis C blood test. Hepatitis B blood test. Sexually transmitted disease (STD) testing. Diabetes screening. This is done by checking your blood sugar (glucose) after you have not eaten for a while (fasting). You may have this done every 1-3 years. Bone density scan. This is done to screen for osteoporosis. You may have this done starting at age 75. Mammogram. This may be done every 1-2 years. Talk to your health care provider about how often you should have regular mammograms. Talk with your health care provider about your test results, treatment options, and if necessary, the need for more tests. Vaccines  Your health care provider may recommend certain vaccines, such as: Influenza vaccine. This is recommended every year. Tetanus, diphtheria, and acellular pertussis (Tdap, Td) vaccine. You may need a Td booster every 10 years. Zoster vaccine. You may need this after age 75. Pneumococcal 13-valent conjugate (PCV13) vaccine. One dose is recommended after age 75. Pneumococcal polysaccharide (PPSV23) vaccine. One dose is recommended after age 75.  Talk to your health care provider about which screenings and vaccines  you need and how often you need them. This information is not intended to replace advice given to you by your health care provider. Make sure you discuss any questions you have with your health care provider. Document Released: 06/15/2015 Document Revised: 02/06/2016 Document Reviewed: 03/20/2015 Elsevier Interactive Patient Education  2017 [place] Prevention in the Home Falls can cause injuries. They can happen to people of all ages. There are many things you can do to make your home safe and to help prevent falls. What can I do on the outside of my home? Regularly fix the edges of walkways and driveways and fix any cracks. Remove anything that might make you trip as you walk through a door, such as a raised step or threshold. Trim any bushes or trees on the path to your home. Use bright outdoor lighting. Clear any walking paths of anything that might make someone trip, such as rocks or tools. Regularly check to see if handrails are loose or broken. Make sure that both sides of any steps have handrails. Any raised decks and porches should have guardrails on the edges. Have any leaves, snow, or ice cleared regularly. Use sand or salt on walking paths during winter. Clean up any spills in your garage right away. This includes oil or grease spills. What can I do in the bathroom? Use night lights. Install grab bars by the toilet and in the tub and shower. Do not use towel bars as grab bars. Use non-skid mats or decals in the tub or shower. If you need to sit down in the shower, use a plastic, non-slip stool. Keep the floor dry. Clean up any water that spills on the floor as soon as it happens. Remove soap buildup in the tub or shower regularly. Attach bath mats securely with double-sided non-slip rug tape. Do not have throw rugs and other things on the floor that can make you trip. What can I do in the bedroom? Use night lights. Make sure that you have a light by your bed that  is easy to reach. Do not use any sheets or blankets that are too big for your bed. They should not hang down onto the floor. Have a firm chair that has side arms. You can use this for support while you get dressed. Do not have throw rugs and other things on the floor that can make you trip. What can I do in the kitchen? Clean up any spills right away. Avoid walking on wet floors. Keep items that you use a lot in easy-to-reach places. If you need to reach something above you, use a strong step stool that has a grab bar. Keep electrical cords out of the way. Do not use floor polish or wax that makes floors slippery. If you must use wax, use non-skid floor wax. Do not have throw rugs and other things on the floor that can make you trip. What can I do with my stairs? Do not leave any items on the stairs. Make sure that there are handrails on both sides of the stairs and use them. Fix handrails that are broken or loose. Make sure that handrails are as long as the stairways. Check any carpeting to make sure that it is firmly attached to the stairs. Fix any carpet that is loose or worn. Avoid having throw rugs at the top or bottom of the stairs. If you do have throw  rugs, attach them to the floor with carpet tape. Make sure that you have a light switch at the top of the stairs and the bottom of the stairs. If you do not have them, ask someone to add them for you. What else can I do to help prevent falls? Wear shoes that: Do not have high heels. Have rubber bottoms. Are comfortable and fit you well. Are closed at the toe. Do not wear sandals. If you use a stepladder: Make sure that it is fully opened. Do not climb a closed stepladder. Make sure that both sides of the stepladder are locked into place. Ask someone to hold it for you, if possible. Clearly mark and make sure that you can see: Any grab bars or handrails. First and last steps. Where the edge of each step is. Use tools that help you  move around (mobility aids) if they are needed. These include: Canes. Walkers. Scooters. Crutches. Turn on the lights when you go into a dark area. Replace any light bulbs as soon as they burn out. Set up your furniture so you have a clear path. Avoid moving your furniture around. If any of your floors are uneven, fix them. If there are any pets around you, be aware of where they are. Review your medicines with your doctor. Some medicines can make you feel dizzy. This can increase your chance of falling. Ask your doctor what other things that you can do to help prevent falls. This information is not intended to replace advice given to you by your health care provider. Make sure you discuss any questions you have with your health care provider. Document Released: 03/15/2009 Document Revised: 10/25/2015 Document Reviewed: 06/23/2014 Elsevier Interactive Patient Education  2017 [employer].

## 2022-03-07 NOTE — Progress Notes (Signed)
Subjective:   [name] [name] is a 76 y.o. female who presents for Medicare Annual (Subsequent) preventive examination.  Review of Systems     Cardiac Risk Factors include: advanced age (>51mn, >>37women);hypertension;obesity (BMI >30kg/m2)     Objective:    There were no vitals filed for this visit. There is no height or weight on file to calculate BMI.     03/07/2022    3:11 PM 08/19/2021   10:34 AM 03/28/2021    1:41 PM 03/18/2021    3:34 PM 02/19/2021    9:35 AM 08/10/2020    2:27 PM 07/19/2020    9:10 AM  Advanced Directives  Does Patient Have a Medical Advance Directive? Yes Yes Yes Yes Yes Yes Yes  Type of AParamed[place]of [place] will;Out of facility DNR (pink MOST or yellow form) H[place]Living will [place]Living will Living will HLockport HeightsLiving will H[place]Living will HCave JunctionLiving will  Does patient want to make changes to medical advance directive? No - Patient declined No - Patient declined No - Patient declined No - Patient declined No - Patient declined No - Patient declined No - Patient declined  Copy of [place]in Chart? Yes - validated most recent copy scanned in chart (See row information) Yes - validated most recent copy scanned in chart (See row information) Yes - validated most recent copy scanned in chart (See row information)  Yes - validated most recent copy scanned in chart (See row information) Yes - validated most recent copy scanned in chart (See row information) Yes - validated most recent copy scanned in chart (See row information)  Pre-existing out of facility DNR order (yellow form or pink MOST form) Yellow form placed in chart (order not valid for inpatient use)          Current Medications (verified) Outpatient Encounter Medications as of 03/07/2022  Medication Sig   amLODipine (NORVASC)  5 MG tablet TAKE 1 TABLET DAILY   Ascorbic Acid (VITAMIN C PO) Take by mouth 2 (two) times daily.   aspirin EC 81 MG tablet Take 81 mg by mouth daily.   beta carotene w/minerals (OCUVITE) tablet Take 1 tablet by mouth daily.   Calcium Carbonate-Vitamin D (CALCIUM 600 + D PO) Take 1 tablet by mouth 2 (two) times daily.   Cholecalciferol (VITAMIN D3) 250 MCG (10000 UT) capsule Take 10,000 Units by mouth daily.   docusate sodium (COLACE) 100 MG capsule Take 100 mg by mouth 2 (two) times daily.   famotidine (PEPCID) 20 MG tablet Take 20 mg by mouth as needed for indigestion or heartburn.   Fish Oil-Cholecalciferol (FISH OIL + D3) 1000-1000 MG-UNIT CAPS Take 1 capsule by mouth 2 (two) times daily.   Lactobacillus-Inulin (CULTURELLE DIGESTIVE HEALTH PO) Take 1 capsule by mouth daily.    losartan (COZAAR) 50 MG tablet TAKE 1 TABLET DAILY   Misc Natural Products (OSTEO BI-FLEX ADV DOUBLE ST PO) Take 1 tablet by mouth 2 (two) times daily.   Multiple Vitamin (MULTIVITAMIN WITH MINERALS) TABS Take 1 tablet by mouth daily.   omeprazole (PRILOSEC) 40 MG capsule TAKE 1 CAPSULE DAILY   polyethylene glycol powder (GLYCOLAX/MIRALAX) powder Take 17 g by mouth daily.   vitamin B-12 (CYANOCOBALAMIN) 1000 MCG tablet Take 1 tablet (1,000 mcg total) by mouth daily.   zinc gluconate 50 MG tablet Take 50 mg by mouth daily.   No facility-administered encounter  medications on file as of 03/07/2022.    Allergies (verified) Patient has no known allergies.   History: Past Medical History:  Diagnosis Date   Acid reflux    Bowel obstruction ([place])    COVID-19    H/O hiatal hernia    Hyperlipidemia    Hypertension    Past Surgical History:  Procedure Laterality Date   ABDOMINAL HYSTERECTOMY     APPENDECTOMY     BREAST CYST EXCISION Right    [phone]? 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 Dr. [place] Crane, Jenny Reichmann    Family History  Problem Relation Age of Onset   Cancer - Other Mother        Kidney   Fibromyalgia Mother    Arthritis Mother    Cancer - Lung Father 81   Heart disease Brother 69       had heart transplant then died 3 years later after restarting smoking   Lung disease Paternal Aunt        Lung Mass   COPD Paternal Uncle    Leukemia Paternal Uncle    Stomach cancer Neg Hx    Esophageal cancer Neg Hx    Pancreatic cancer Neg Hx    Rectal cancer Neg Hx    Social History   Socioeconomic History   Marital status: Single    Spouse name: Not on file   Number of children: Not on file   Years of education: Not on file   Highest education level: Not on file  Occupational History   Not on file  Tobacco Use   Smoking status: Never   Smokeless tobacco: Never  Vaping Use   Vaping Use: Never used  Substance and Sexual Activity   Alcohol use: Yes    Comment: occassionally   Drug use: No   Sexual activity: Not Currently  Other Topics Concern   Not on file  Social History Narrative   Social History      Diet?       Do you drink/eat things with caffeine? yes      Marital status?            single                        What year were you married?      Do you live in a house, apartment, assisted living, condo, trailer, etc.? house      Is it one or more stories? Yes but seldom go upstairs      How many persons live in your home? 1       Do you have any pets in your home? (please list) no       Highest level of education completed? 12- business college      Current or past profession: retired      Do you exercise?                  yes  Type & how often? 5 days a week- 1  hour per day      Advanced Directives      Do you have a living will? yes      Do you have a DNR form?                                  If not, do you want to discuss one? yes      Do you have signed POA/HPOA for forms?  yes      Functional Status      Do you have difficulty bathing or dressing yourself? no      Do you have difficulty preparing food or eating? no      Do you have difficulty managing your medications? no      Do you have difficulty managing your finances?  no      Do you have difficulty affording your medications?  no      Social Determinants of Health   Financial Resource Strain: Not on file  Food Insecurity: Not on file  Transportation Needs: Not on file  Physical Activity: Not on file  Stress: Not on file  Social Connections: Not on file    Tobacco Counseling Counseling given: Not Answered   Clinical Intake:  Pre-visit preparation completed: Yes  Pain : No/denies pain     BMI - recorded: 32.2 Nutritional Status: BMI > 30  Obese Nutritional Risks: None Diabetes: No  How often do you need to have someone help you when you read instructions, pamphlets, or other written materials from your doctor or pharmacy?: 1 - Never What is the last grade level you completed in school?: Some college  Diabetic?no  Interpreter Needed?: No  Information entered by :: [place] of Daily Living    03/07/2022    3:12 PM  In your present state of health, do you have any difficulty performing the following activities:  Hearing? 0  Vision? 0  Difficulty concentrating or making decisions? 0  Walking or climbing stairs? 0  Dressing or bathing? 0  Doing errands, shopping? 0  Preparing Food and eating ? N  Using the Toilet? N  In the past six months, have you accidently leaked urine? Y  Comment only with coughing per pt  Do you have problems with loss of bowel control? N  Managing your Medications? N  Managing your Finances? N   Housekeeping or managing your Housekeeping? N    Patient Care Team: [name], NP as PCP - General (Geriatric Medicine) Warden Fillers, MD as Consulting Physician (Ophthalmology)  Indicate any recent Medical Services you may have received from other than [facility] providers in the past year (date may be approximate).     Assessment:   This is a routine wellness examination for [name] [name].  Hearing/Vision screen Hearing Screening - Comments:: No concerns Vision Screening - Comments:: No concerns/ wears reading glasses  Dietary issues and exercise activities discussed: Current Exercise Habits: Home exercise routine, Type of exercise: walking, Time (Minutes): 30, Frequency (Times/Week): 5, Weekly Exercise (Minutes/Week): 150, Intensity: Mild, Exercise limited by: None identified   Goals Addressed   None    Depression Screen    03/07/2022    3:12 PM 02/19/2021    9:33 AM 04/11/2020    3:13 PM 02/16/2020    8:55 AM 02/14/2019   11:02 AM 04/23/2018    9:03 AM  03/23/2018    8:59 AM  PHQ 2/9 Scores  PHQ - 2 Score 0 0 0 0 0 0 0    Fall Risk    03/07/2022    3:11 PM 02/24/2022    1:05 PM 08/19/2021    1:04 PM 03/28/2021    1:35 PM 02/19/2021    9:34 AM  [place] in the past year? 1 1 0 1 0  Number falls in past yr: 1 1 0 0 0  Injury with Fall? 1 0 0 1 0  Risk for fall due to : History of fall(s) History of fall(s) No Fall Risks History of fall(s) No Fall Risks  Follow up   Falls evaluation completed Falls evaluation completed;Education provided;Falls prevention discussed Falls evaluation completed    FALL RISK PREVENTION PERTAINING TO THE HOME:  Any stairs in or around the home? Yes  If so, are there any without handrails? No  Home free of loose throw rugs in walkways, pet beds, electrical cords, etc? Yes  Adequate lighting in your home to reduce risk of falls? Yes   ASSISTIVE DEVICES UTILIZED TO PREVENT FALLS:  Life alert? Yes  Use of a cane, walker or w/c?  No  Grab bars in the bathroom? No  Shower chair or bench in shower? No  Elevated toilet seat or a handicapped toilet? No   TIMED UP AND GO:  Was the test performed? No .   Cognitive Function:    02/14/2019   11:13 AM  MMSE - Mini Mental State Exam  Orientation to time 4  Orientation to Place 5  Registration 3  Attention/ Calculation 5  Recall 3  Language- name 2 objects 2  Language- repeat 1  Language- follow 3 step command 3  Language- read & follow direction 1  Write a sentence 1  Copy design 1  Total score 29        03/07/2022    3:14 PM 02/19/2021    9:35 AM 02/16/2020    8:58 AM  6CIT Screen  What Year? 0 points 0 points 0 points  What month? 0 points 0 points 0 points  What time? 0 points 0 points 0 points  Count back from 20 0 points 0 points 0 points  Months in reverse 0 points 0 points 0 points  Repeat phrase 0 points 0 points 2 points  Total Score 0 points 0 points 2 points    Immunizations Immunization History  Administered Date(s) Administered   Fluad Quad(high Dose 65+) 02/14/2019, 02/11/2021, 02/24/2022   Influenza, High Dose Seasonal PF 03/26/2017, 03/23/2018, 04/03/2020   Influenza,inj,Quad PF,6+ Mos 02/13/2013, 03/26/2015   Influenza-Unspecified 06/02/2016   PFIZER(Purple Top)SARS-COV-2 Vaccination 06/24/2019, 07/15/2019, 02/27/2020, 10/24/2020   PPD Test 08/10/2018   Pfizer Covid-19 Vaccine Bivalent Booster 67yr & up 04/22/2021   Pneumococcal Conjugate-13 03/24/2014   Pneumococcal Polysaccharide-23 04/23/2018   Tdap 03/24/2018   Tetanus 06/02/2001   Zoster Recombinat (Shingrix) 03/24/2018, 06/01/2018   Zoster, Live 06/02/2012    TDAP status: Up to date  Flu Vaccine status: Up to date  Pneumococcal vaccine status: Up to date  Covid-19 vaccine status: Information provided on how to obtain vaccines.   Qualifies for Shingles Vaccine? Yes   Zostavax completed Yes   Shingrix Completed?: Yes  Screening Tests Health Maintenance  Topic  Date Due   COVID-19 Vaccine (6 - Pfizer series) 08/20/2021   COLONOSCOPY (Pts 45-430yrInsurance coverage will need to be confirmed)  03/19/2022   TETANUS/TDAP  03/24/2028   Pneumonia Vaccine 14+ Years old  Completed   INFLUENZA VACCINE  Completed   DEXA SCAN  Completed   Hepatitis C Screening  Completed   Zoster Vaccines- Shingrix  Completed   HPV VACCINES  Aged Out    Health Maintenance  Health Maintenance Due  Topic Date Due   COVID-19 Vaccine (6 - Pfizer series) 08/20/2021    Colorectal cancer screening: Type of screening: Colonoscopy. Completed 03/19/2012. Repeat every 10 years  Mammogram status: Completed this year. Repeat every year  Bone Density status: Completed 12/25/20. Results reflect: Bone density results: NORMAL. Repeat every 5 years.  Lung Cancer Screening: (Low Dose CT Chest recommended if Age 51-80 years, 30 pack-year currently smoking OR have quit w/in 15years.) does not qualify.   Lung Cancer Screening Referral: na  Additional Screening:  Hepatitis C Screening: does not qualify; Completed na  Vision Screening: Recommended annual ophthalmology exams for early detection of glaucoma and other disorders of the eye. Is the patient up to date with their annual eye exam?  Yes  Who is the provider or what is the name of the office in which the patient attends annual eye exams? [facility] If pt is not established with a provider, would they like to be referred to a provider to establish care? No .   Dental Screening: Recommended annual dental exams for proper oral hygiene  Community Resource Referral / Chronic Care Management: CRR required this visit?  No   CCM required this visit?  No      Plan:     I have personally reviewed and noted the following in the patient's chart:   Medical and social history Use of alcohol, tobacco or illicit drugs  Current medications and supplements including opioid prescriptions. Patient is not currently taking opioid  prescriptions. Functional ability and status Nutritional status Physical activity Advanced directives List of other physicians Hospitalizations, surgeries, and ER visits in previous 12 months Vitals Screenings to include cognitive, depression, and falls Referrals and appointments  In addition, I have reviewed and discussed with patient certain preventive protocols, quality metrics, and best practice recommendations. A written personalized care plan for preventive services as well as general preventive health recommendations were provided to patient.     [name], NP   03/07/2022    Virtual Visit via Telephone Note  I connected with patient 03/07/22 at  3:20 PM EDT by telephone and verified that I am speaking with the correct person using two identifiers.  Location: Patient: home Provider: [place]   I discussed the limitations, risks, security and privacy concerns of performing an evaluation and management service by telephone and the availability of in person appointments. I also discussed with the patient that there may be a patient responsible charge related to this service. The patient expressed understanding and agreed to proceed.   I discussed the assessment and treatment plan with the patient. The patient was provided an opportunity to ask questions and all were answered. The patient agreed with the plan and demonstrated an understanding of the instructions.   The patient was advised to call back or seek an in-person evaluation if the symptoms worsen or if the condition fails to improve as anticipated.  I provided 15 minutes of non-face-to-face time during this encounter.  Carlos American. Harle Battiest Av[employer] printed and mailed

## 2022-03-19 ENCOUNTER — Encounter: Payer: Self-pay | Admitting: Nurse Practitioner

## 2022-03-19 DIAGNOSIS — Z23 Encounter for immunization: Secondary | ICD-10-CM | POA: Diagnosis not present

## 2022-03-20 ENCOUNTER — Other Ambulatory Visit: Payer: Self-pay | Admitting: Nurse Practitioner

## 2022-03-20 DIAGNOSIS — Z1231 Encounter for screening mammogram for malignant neoplasm of breast: Secondary | ICD-10-CM

## 2022-04-14 ENCOUNTER — Other Ambulatory Visit: Payer: Self-pay | Admitting: Nurse Practitioner

## 2022-04-27 ENCOUNTER — Other Ambulatory Visit: Payer: Self-pay | Admitting: Nurse Practitioner

## 2022-05-05 ENCOUNTER — Ambulatory Visit (INDEPENDENT_AMBULATORY_CARE_PROVIDER_SITE_OTHER): Payer: Medicare Other | Admitting: Gastroenterology

## 2022-05-05 ENCOUNTER — Encounter (INDEPENDENT_AMBULATORY_CARE_PROVIDER_SITE_OTHER): Payer: Self-pay | Admitting: Gastroenterology

## 2022-05-05 VITALS — BP 150/84 | HR 90 | Temp 98.4°F | Ht 64.0 in | Wt 188.6 lb

## 2022-05-05 DIAGNOSIS — K760 Fatty (change of) liver, not elsewhere classified: Secondary | ICD-10-CM

## 2022-05-05 DIAGNOSIS — R7401 Elevation of levels of liver transaminase levels: Secondary | ICD-10-CM

## 2022-05-05 NOTE — Progress Notes (Signed)
Referring Provider: [name], NP Primary Care Physician:  [name], NP Primary GI Physician: new   Chief Complaint  Patient presents with   Elevated Hepatic Enzymes    New patient. Referred for elevated liver enzymes. Has a pain on left side for a couple of months.    HPI:   [name] [name] is a 76 y.o. female with past medical history of GERD, bowel obstruction, hiatal hernia, HLD, HTN  Patient presenting today as a new patient for Elevated LFTs.  [name] Abd complete in march with hepatic steatosis, no other acute findings.  Per chart review, elevated aminotransferases since 2021 with normal AP and T bili, most recently with AST 86 and ALT 88 in September. Hep C Ab non reactive in 2021.   Patient states that LFTs have continued to go up for the past few years. PCP stopped one of her medications that she takes at night but she is unsure what it was. LFTs did not change with this. She states that she has been on fish oil for a good while. She reports she was previously drinking some wine, maybe 1 glass per month but recently has noticed more flushing and swelling with this so she has not really been consuming alcohol. No other herbal supplements or energy supplement. Only occasional tylenol use. She does exercise 5 days a week at [employer] as she is a Psychologist, occupational there.   She notes she has had some intermittent LUQ pain for the past few months without precipitating factors. Notes that pain is self limiting and last for just a few seconds. No nausea, vomiting, constipation or diarrhea. No rectal bleeding or melena.   History of SBO in the past, she notes that she tries not to overeat as this seems to be what brought it on in the past.   NSAID use: rare  Social hx: rare etoh use, no tobacco use  Fam hx: no crc or liver disease.   Last Colonoscopy:01/2012 Examination performed to cecum. No evidence of colonic polyps. Small submucosal lipoma at ascending colon, two  anal papillae and external hemorrhoids.  Past Medical History:  Diagnosis Date   Acid reflux    Bowel obstruction ([place])    COVID-19    H/O hiatal hernia    Hyperlipidemia    Hypertension     Past Surgical History:  Procedure Laterality Date   ABDOMINAL HYSTERECTOMY     APPENDECTOMY     BREAST CYST EXCISION Right    [phone]? benign   COLONOSCOPY  03/18/2012   Procedure: COLONOSCOPY;  Surgeon: Rogene Houston, MD;  Location: [facility];  Service: Endoscopy;  Laterality: N/A;  930   LAPAROTOMY N/A 02/14/2013   Procedure: EXPLORATORY LAPAROTOMY;  Surgeon: Jamesetta So, MD;  Location: [facility];  Service: General;  Laterality: N/A;   MELANOMA EXCISION  03/2019   Dr.Hall, removed from [place]  02/2019   Melanoma removed from upper back. Dr. Nevada Crane, Jenny Reichmann     Current Outpatient Medications  Medication Sig Dispense Refill   amLODipine (NORVASC) 5 MG tablet TAKE 1 TABLET DAILY 90 tablet 1   Ascorbic Acid (VITAMIN C PO) Take by mouth 2 (two) times daily.     aspirin EC 81 MG tablet Take 81 mg by mouth daily.     beta carotene w/minerals (OCUVITE) tablet Take 1 tablet by mouth daily.     Calcium Carbonate-Vitamin D (CALCIUM 600 + D PO) Take 1 tablet by  mouth 2 (two) times daily.     Cholecalciferol (VITAMIN D3) 250 MCG (10000 UT) capsule Take 10,000 Units by mouth daily.     docusate sodium (COLACE) 100 MG capsule Take 100 mg by mouth 2 (two) times daily.     Fish Oil-Cholecalciferol (FISH OIL + D3) 1000-1000 MG-UNIT CAPS Take 1 capsule by mouth 2 (two) times daily.     Lactobacillus-Inulin (CULTURELLE DIGESTIVE HEALTH PO) Take 1 capsule by mouth daily.      losartan (COZAAR) 50 MG tablet TAKE 1 TABLET DAILY 90 tablet 1   Misc Natural Products (OSTEO BI-FLEX ADV DOUBLE ST PO) Take 1 tablet by mouth 2 (two) times daily.     Multiple Vitamin (MULTIVITAMIN WITH MINERALS) TABS Take 1 tablet by mouth daily.     omeprazole (PRILOSEC) 40 MG capsule TAKE 1 CAPSULE DAILY 90 capsule  1   polyethylene glycol powder (GLYCOLAX/MIRALAX) powder Take 17 g by mouth daily.     vitamin B-12 (CYANOCOBALAMIN) 1000 MCG tablet Take 1 tablet (1,000 mcg total) by mouth daily. 30 tablet 11   zinc gluconate 50 MG tablet Take 50 mg by mouth daily.     No current facility-administered medications for this visit.    Allergies as of 05/05/2022   (No Known Allergies)    Family History  Problem Relation Age of Onset   Cancer - Other Mother        Kidney   Fibromyalgia Mother    Arthritis Mother    Cancer - Lung Father 7   Heart disease Brother 31       had heart transplant then died 3 years later after restarting smoking   Lung disease Paternal Aunt        Lung Mass   COPD Paternal Uncle    Leukemia Paternal Uncle    Stomach cancer Neg Hx    Esophageal cancer Neg Hx    Pancreatic cancer Neg Hx    Rectal cancer Neg Hx     Social History   Socioeconomic History   Marital status: Single    Spouse name: Not on file   Number of children: Not on file   Years of education: Not on file   Highest education level: Not on file  Occupational History   Not on file  Tobacco Use   Smoking status: Never    Passive exposure: Never   Smokeless tobacco: Never  Vaping Use   Vaping Use: Never used  Substance and Sexual Activity   Alcohol use: Yes    Comment: occassionally   Drug use: No   Sexual activity: Not Currently  Other Topics Concern   Not on file  Social History Narrative   Social History      Diet?       Do you drink/eat things with caffeine? yes      Marital status?            single                        What year were you married?      Do you live in a house, apartment, assisted living, condo, trailer, etc.? house      Is it one or more stories? Yes but seldom go upstairs      How many persons live in your home? 1       Do you have any pets in your home? (please list) no  Highest level of education completed? 12- business college      Current or  past profession: retired      Do you exercise?                  yes                    Type & how often? 5 days a week- 1 hour per day      Advanced Directives      Do you have a living will? yes      Do you have a DNR form?                                  If not, do you want to discuss one? yes      Do you have signed POA/HPOA for forms?  yes      Functional Status      Do you have difficulty bathing or dressing yourself? no      Do you have difficulty preparing food or eating? no      Do you have difficulty managing your medications? no      Do you have difficulty managing your finances?  no      Do you have difficulty affording your medications?  no      Social Determinants of Health   Financial Resource Strain: Not on file  Food Insecurity: Not on file  Transportation Needs: Not on file  Physical Activity: Not on file  Stress: Not on file  Social Connections: Not on file   Review of systems General: negative for malaise, night sweats, fever, chills, weight loss Neck: Negative for lumps, goiter, pain and significant neck swelling Resp: Negative for cough, wheezing, dyspnea at rest CV: Negative for chest pain, leg swelling, palpitations, orthopnea GI: denies melena, hematochezia, nausea, vomiting, diarrhea, constipation, dysphagia, odyonophagia, early satiety or unintentional weight loss.  MSK: Negative for joint pain or swelling, back pain, and muscle pain. Derm: Negative for itching or rash Psych: Denies depression, anxiety, memory loss, confusion. No homicidal or suicidal ideation.  Heme: Negative for prolonged bleeding, bruising easily, and swollen nodes. Endocrine: Negative for cold or heat intolerance, polyuria, polydipsia and goiter. Neuro: negative for tremor, gait imbalance, syncope and seizures. The remainder of the review of systems is noncontributory.  Physical Exam: BP (!) 150/84 (BP Location: Left Arm, Patient Position: Sitting, Cuff Size: Large)    Pulse 90   Temp 98.4 F (36.9 C) (Oral)   Ht '5\' 4"'$  (1.626 m)   Wt 188 lb 9.6 oz (85.5 kg)   LMP 09/10/2014 (Exact Date)   BMI 32.37 kg/m  General:   Alert and oriented. No distress noted. Pleasant and cooperative.  Head:  Normocephalic and atraumatic. Eyes:  Conjuctiva clear without scleral icterus. Mouth:  Oral mucosa pink and moist. Good dentition. No lesions. Heart: Normal rate and rhythm, s1 and s2 heart sounds present.  Lungs: Clear lung sounds in all lobes. Respirations equal and unlabored. Abdomen:  +BS, soft, non-tender and non-distended. No rebound or guarding. No HSM or masses noted. Derm: No palmar erythema or jaundice Msk:  Symmetrical without gross deformities. Normal posture. Extremities:  Without edema. Neurologic:  Alert and  oriented x4 Psych:  Alert and cooperative. Normal mood and affect.  Invalid input(s): "6 MONTHS"   ASSESSMENT: Myleen Zoe Creasman is a 76 y.o. female presenting today as a new  patient for elevated LFTs.  Transaminitis x2 years per chart review with most recent AST 86 and ALT 88, no alterations in AP or T bili. She does not use any herbal supplements other than omega 3 fish oil, no frequent etoh or tylenol use. Appears to be a hepatocellular pattern. [name] abd complete with hepatic steatosis, elevation likely related to fatty liver/possible NASH. Will proceed with further serologies to rule out iron overload, autoimmune process, acute viral hepatitis. I encouraged her to continue with her current exercise regimen, avoid alcohol and implement mediterranean diet.    PLAN:  Acute hep panel, Iron studies, liver serologies  2. Mediterranean diet  3. Atleast 30 minutes of physical activity 4-5x/week  4. Avoid alcohol 5. Consider stopping fish oil   All questions were answered, patient verbalized understanding and is in agreement with plan as outlined above.   Follow Up: 6 months   Lacoya Wilbanks L. Alver Sorrow, MSN, APRN, AGNP-C Adult-Gerontology Nurse  Practitioner [facility]

## 2022-05-05 NOTE — Patient Instructions (Addendum)
It was nice to meet you! We will check further labs to rule out other causes of elevated liver enzymes though this is likely related to fatty liver I would recommend continuing your current exercise regimen, avoiding alcohol and implementing mediterranean diet  Follow up 6 months

## 2022-05-07 ENCOUNTER — Other Ambulatory Visit (INDEPENDENT_AMBULATORY_CARE_PROVIDER_SITE_OTHER): Payer: Self-pay | Admitting: Gastroenterology

## 2022-05-07 ENCOUNTER — Ambulatory Visit
Admission: RE | Admit: 2022-05-07 | Discharge: 2022-05-07 | Disposition: A | Discharge status: Home / Self Care | Payer: Medicare Other | Source: Ambulatory Visit | Attending: Nurse Practitioner | Admitting: Nurse Practitioner

## 2022-05-07 ENCOUNTER — Other Ambulatory Visit: Payer: Self-pay | Admitting: *Deleted

## 2022-05-07 DIAGNOSIS — Z1231 Encounter for screening mammogram for malignant neoplasm of breast: Secondary | ICD-10-CM

## 2022-05-07 DIAGNOSIS — R7989 Other specified abnormal findings of blood chemistry: Secondary | ICD-10-CM

## 2022-05-07 LAB — IGG, IGA, IGM
IgA/Immunoglobulin A, Serum: 299 mg/dL (ref 64–422)
IgG (Immunoglobin G), Serum: 1095 mg/dL (ref 586–1602)
IgM (Immunoglobulin M), Srm: 54 mg/dL (ref 26–217)

## 2022-05-07 LAB — MITOCHONDRIAL ANTIBODIES: Mitochondrial Ab: <20 Units (ref 0.0–20.0)

## 2022-05-07 LAB — IRON,TIBC AND FERRITIN PANEL
Ferritin: 199 ng/mL — ABNORMAL HIGH (ref 15–150)
Iron Saturation: 37 % (ref 15–55)
Iron: 101 ug/dL (ref 27–139)
Total Iron Binding Capacity: 273 ug/dL (ref 250–450)
UIBC: 172 ug/dL (ref 118–369)

## 2022-05-07 LAB — ANTI-SMOOTH MUSCLE ANTIBODY, IGG: Smooth Muscle Ab: 8 Units (ref 0–19)

## 2022-05-07 LAB — ACUTE VIRAL HEPATITIS (HAV, HBV, HCV)
HCV Ab: NONREACTIVE
Hep A IgM: NEGATIVE
Hep B C IgM: NEGATIVE
Hepatitis B Surface Ag: NEGATIVE

## 2022-05-07 LAB — ANA: Anti Nuclear Antibody (ANA): NEGATIVE

## 2022-05-07 LAB — HCV INTERPRETATION

## 2022-05-07 LAB — ALPHA-1-ANTITRYPSIN: A-1 Antitrypsin: 137 mg/dL (ref 101–187)

## 2022-05-07 LAB — CERULOPLASMIN: Ceruloplasmin: 20.4 mg/dL (ref 19.0–39.0)

## 2022-05-08 ENCOUNTER — Telehealth: Payer: Self-pay | Admitting: *Deleted

## 2022-05-08 DIAGNOSIS — R7989 Other specified abnormal findings of blood chemistry: Secondary | ICD-10-CM | POA: Diagnosis not present

## 2022-05-08 NOTE — Telephone Encounter (Signed)
Gabriel Rung, NP  Carmelina Noun, LPN Cc: [name] [name], CMA Sounds good.  Tilly Pernice, can we schedule patient for screening colonoscopy, ASA III, ENDO 3 thanks! -----   LMOVM to call back

## 2022-05-08 NOTE — Telephone Encounter (Signed)
Patient left a message that our office called her ... She was returning the call.

## 2022-05-09 NOTE — Telephone Encounter (Signed)
Called pt, LMOVM to call back. 

## 2022-05-12 ENCOUNTER — Encounter: Payer: Self-pay | Admitting: *Deleted

## 2022-05-12 MED ORDER — PEG 3350-KCL-NA BICARB-NACL 420 G PO SOLR: 4000.0000 mL | Freq: Once | ORAL | 0 refills | Status: AC

## 2022-05-12 NOTE — Telephone Encounter (Signed)
Pt is scheduled for 07/01/22 at 11:30 am, instructions mailed and prep sent to the pharmacy.

## 2022-05-13 ENCOUNTER — Telehealth: Payer: Self-pay | Admitting: *Deleted

## 2022-05-13 NOTE — Telephone Encounter (Signed)
Pt cancelled her procedure for 07/01/22 at 11:30 am with Dr.Castaneda. She would like to reschedule until February. Advised pt once get providers schedule we will give her a call.  TCS ASA 3, screening

## 2022-05-15 LAB — HEMOCHROMATOSIS DNA-PCR(C282Y,H63D)

## 2022-06-05 DIAGNOSIS — Z85820 Personal history of malignant melanoma of skin: Secondary | ICD-10-CM | POA: Diagnosis not present

## 2022-06-05 DIAGNOSIS — Z08 Encounter for follow-up examination after completed treatment for malignant neoplasm: Secondary | ICD-10-CM | POA: Diagnosis not present

## 2022-06-05 DIAGNOSIS — L820 Inflamed seborrheic keratosis: Secondary | ICD-10-CM | POA: Diagnosis not present

## 2022-06-05 DIAGNOSIS — Z1283 Encounter for screening for malignant neoplasm of skin: Secondary | ICD-10-CM | POA: Diagnosis not present

## 2022-06-05 DIAGNOSIS — D225 Melanocytic nevi of trunk: Secondary | ICD-10-CM | POA: Diagnosis not present

## 2022-06-27 ENCOUNTER — Other Ambulatory Visit (HOSPITAL_COMMUNITY): Payer: Medicare Other

## 2022-06-30 ENCOUNTER — Other Ambulatory Visit: Payer: Self-pay | Admitting: *Deleted

## 2022-06-30 ENCOUNTER — Encounter: Payer: Self-pay | Admitting: *Deleted

## 2022-06-30 ENCOUNTER — Telehealth: Payer: Self-pay | Admitting: *Deleted

## 2022-06-30 MED ORDER — PEG 3350-KCL-NA BICARB-NACL 420 G PO SOLR: 4000.0000 mL | Freq: Once | ORAL | 0 refills | Status: AC

## 2022-06-30 NOTE — Telephone Encounter (Signed)
Pt has been rescheduled for 07/29/22. New instructions mailed to pt

## 2022-07-01 ENCOUNTER — Ambulatory Visit (HOSPITAL_COMMUNITY): Admit: 2022-07-01 | Payer: Medicare Other | Admitting: Gastroenterology

## 2022-07-01 ENCOUNTER — Encounter: Payer: Self-pay | Admitting: *Deleted

## 2022-07-01 ENCOUNTER — Encounter (HOSPITAL_COMMUNITY): Payer: Self-pay

## 2022-07-01 SURGERY — COLONOSCOPY WITH PROPOFOL
Anesthesia: Monitor Anesthesia Care

## 2022-07-02 ENCOUNTER — Encounter: Payer: Self-pay | Admitting: Nurse Practitioner

## 2022-07-02 ENCOUNTER — Other Ambulatory Visit: Payer: Self-pay

## 2022-07-02 MED ORDER — AMLODIPINE BESYLATE 5 MG PO TABS: 5.0000 mg | ORAL_TABLET | Freq: Every day | ORAL | 1 refills | Status: DC

## 2022-07-24 NOTE — Patient Instructions (Addendum)
[name] [name]  07/24/2022     @PREFPERIOPPHARMACY$ @   Your procedure is scheduled on  07/29/2022.   Report to [name] at  1215  A.M.   Call this number if you have problems the morning of surgery:  [phone]  If you experience any cold or flu symptoms such as cough, fever, chills, shortness of breath, etc. between now and your scheduled surgery, please notify [name] [name] at the above number.   Remember:  Follow the diet and prep instructions given to you by the office.     Take these medicines the morning of surgery with A SIP OF WATER                             amlodipine, omeprazole.     Do not wear jewelry, make-up or nail polish.  Do not wear lotions, powders, or perfumes, or deodorant.  Do not shave 48 hours prior to surgery.  Men may shave face and neck.  Do not bring valuables to the hospital.  [facility] is not responsible for any belongings or valuables.  Contacts, dentures or bridgework may not be worn into surgery.  Leave your suitcase in the car.  After surgery it may be brought to your room.  For patients admitted to the hospital, discharge time will be determined by your treatment team.  Patients discharged the day of surgery will not be allowed to drive home and must have someone with them for 24 hours.    Special instructions:  DO NOT smoke tobacco or vape for 24 hours before your procedure.   Please read over the following fact sheets that you were given. Anesthesia Post-op Instructions and Care and Recovery After Surgery       Colonoscopy, Adult, Care After The following information offers guidance on how to care for yourself after your procedure. Your health care provider may also give you more specific instructions. If you have problems or questions, contact your health care provider. What can I expect after the procedure? After the procedure, it is common to have: A small amount of blood in your stool for 24 hours after the  procedure. Some gas. Mild cramping or bloating of your abdomen. Follow these instructions at home: Eating and drinking  Drink enough fluid to keep your urine pale yellow. Follow instructions from your health care provider about eating or drinking restrictions. Resume your normal diet as told by your health care provider. Avoid heavy or fried foods that are hard to digest. Activity Rest as told by your health care provider. Avoid sitting for a long time without moving. Get up to take short walks every 1-2 hours. This is important to improve blood flow and breathing. Ask for help if you feel weak or unsteady. Return to your normal activities as told by your health care provider. Ask your health care provider what activities are safe for you. Managing cramping and bloating  Try walking around when you have cramps or feel bloated. If directed, apply heat to your abdomen as told by your health care provider. Use the heat source that your health care provider recommends, such as a moist heat pack or a heating pad. Place a towel between your skin and the heat source. Leave the heat on for 20-30 minutes. Remove the heat if your skin turns bright red. This is especially important if you are unable to feel pain, heat, or cold.  You have a greater risk of getting burned. General instructions If you were given a sedative during the procedure, it can affect you for several hours. Do not drive or operate machinery until your health care provider says that it is safe. For the first 24 hours after the procedure: Do not sign important documents. Do not drink alcohol. Do your regular daily activities at a slower pace than normal. Eat soft foods that are easy to digest. Take over-the-counter and prescription medicines only as told by your health care provider. Keep all follow-up visits. This is important. Contact a health care provider if: You have blood in your stool 2-3 days after the procedure. Get  help right away if: You have more than a small spotting of blood in your stool. You have large blood clots in your stool. You have swelling of your abdomen. You have nausea or vomiting. You have a fever. You have increasing pain in your abdomen that is not relieved with medicine. These symptoms may be an emergency. Get help right away. Call 911. Do not wait to see if the symptoms will go away. Do not drive yourself to the hospital. Summary After the procedure, it is common to have a small amount of blood in your stool. You may also have mild cramping and bloating of your abdomen. If you were given a sedative during the procedure, it can affect you for several hours. Do not drive or operate machinery until your health care provider says that it is safe. Get help right away if you have a lot of blood in your stool, nausea or vomiting, a fever, or increased pain in your abdomen. This information is not intended to replace advice given to you by your health care provider. Make sure you discuss any questions you have with your health care provider. Document Revised: 01/09/2021 Document Reviewed: 01/09/2021 Elsevier Patient Education  [place] After The following information offers guidance on how to care for yourself after your procedure. Your health care provider may also give you more specific instructions. If you have problems or questions, contact your health care provider. What can I expect after the procedure? After the procedure, it is common to have: Tiredness. Little or no memory about what happened during or after the procedure. Impaired judgment when it comes to making decisions. Nausea or vomiting. Some trouble with balance. Follow these instructions at home: For the time period you were told by your health care provider:  Rest. Do not participate in activities where you could fall or become injured. Do not drive or use machinery. Do  not drink alcohol. Do not take sleeping pills or medicines that cause drowsiness. Do not make important decisions or sign legal documents. Do not take care of children on your own. Medicines Take over-the-counter and prescription medicines only as told by your health care provider. If you were prescribed antibiotics, take them as told by your health care provider. Do not stop using the antibiotic even if you start to feel better. Eating and drinking Follow instructions from your health care provider about what you may eat and drink. Drink enough fluid to keep your urine pale yellow. If you vomit: Drink clear fluids slowly and in small amounts as you are able. Clear fluids include water, ice chips, low-calorie sports drinks, and fruit juice that has water added to it (diluted fruit juice). Eat light and bland foods in small amounts as you are able. These foods include bananas, applesauce,  rice, lean meats, toast, and crackers. General instructions  Have a responsible adult stay with you for the time you are told. It is important to have someone help care for you until you are awake and alert. If you have sleep apnea, surgery and some medicines can increase your risk for breathing problems. Follow instructions from your health care provider about wearing your sleep device: When you are sleeping. This includes during daytime naps. While taking prescription pain medicines, sleeping medicines, or medicines that make you drowsy. Do not use any products that contain nicotine or tobacco. These products include cigarettes, chewing tobacco, and vaping devices, such as e-cigarettes. If you need help quitting, ask your health care provider. Contact a health care provider if: You feel nauseous or vomit every time you eat or drink. You feel light-headed. You are still sleepy or having trouble with balance after 24 hours. You get a rash. You have a fever. You have redness or swelling around the IV  site. Get help right away if: You have trouble breathing. You have new confusion after you get home. These symptoms may be an emergency. Get help right away. Call 911. Do not wait to see if the symptoms will go away. Do not drive yourself to the hospital. This information is not intended to replace advice given to you by your health care provider. Make sure you discuss any questions you have with your health care provider. Document Revised: 10/14/2021 Document Reviewed: 10/14/2021 Elsevier Patient Education  [place].

## 2022-07-25 ENCOUNTER — Encounter (HOSPITAL_COMMUNITY): Payer: Self-pay

## 2022-07-25 ENCOUNTER — Encounter (HOSPITAL_COMMUNITY)
Admission: RE | Admit: 2022-07-25 | Discharge: 2022-07-25 | Disposition: A | Discharge status: Home / Self Care | Payer: Medicare Other | Source: Ambulatory Visit | Attending: Gastroenterology | Admitting: Gastroenterology

## 2022-07-25 VITALS — BP 154/90 | HR 81 | Temp 97.6°F | Resp 18 | Ht 64.0 in | Wt 188.5 lb

## 2022-07-25 DIAGNOSIS — R9431 Abnormal electrocardiogram [ECG] [EKG]: Secondary | ICD-10-CM | POA: Insufficient documentation

## 2022-07-25 DIAGNOSIS — Z01810 Encounter for preprocedural cardiovascular examination: Secondary | ICD-10-CM | POA: Diagnosis not present

## 2022-07-25 DIAGNOSIS — I10 Essential (primary) hypertension: Secondary | ICD-10-CM | POA: Diagnosis not present

## 2022-07-29 ENCOUNTER — Ambulatory Visit (HOSPITAL_COMMUNITY)
Admission: RE | Admit: 2022-07-29 | Discharge: 2022-07-29 | Disposition: A | Discharge status: Home / Self Care | Payer: Medicare Other | Attending: Gastroenterology | Admitting: Gastroenterology

## 2022-07-29 ENCOUNTER — Encounter (HOSPITAL_COMMUNITY): Payer: Self-pay | Admitting: Gastroenterology

## 2022-07-29 ENCOUNTER — Ambulatory Visit (HOSPITAL_COMMUNITY): Payer: Medicare Other | Admitting: Anesthesiology

## 2022-07-29 ENCOUNTER — Encounter (HOSPITAL_COMMUNITY)
Admission: RE | Disposition: A | Discharge status: Home / Self Care | Payer: Self-pay | Source: Home / Self Care | Attending: Gastroenterology

## 2022-07-29 ENCOUNTER — Ambulatory Visit (HOSPITAL_BASED_OUTPATIENT_CLINIC_OR_DEPARTMENT_OTHER): Payer: Medicare Other | Admitting: Anesthesiology

## 2022-07-29 DIAGNOSIS — Z79899 Other long term (current) drug therapy: Secondary | ICD-10-CM | POA: Diagnosis not present

## 2022-07-29 DIAGNOSIS — I10 Essential (primary) hypertension: Secondary | ICD-10-CM | POA: Insufficient documentation

## 2022-07-29 DIAGNOSIS — K635 Polyp of colon: Secondary | ICD-10-CM

## 2022-07-29 DIAGNOSIS — D123 Benign neoplasm of transverse colon: Secondary | ICD-10-CM | POA: Diagnosis not present

## 2022-07-29 DIAGNOSIS — K5520 Angiodysplasia of colon without hemorrhage: Secondary | ICD-10-CM | POA: Diagnosis not present

## 2022-07-29 DIAGNOSIS — K449 Diaphragmatic hernia without obstruction or gangrene: Secondary | ICD-10-CM | POA: Insufficient documentation

## 2022-07-29 DIAGNOSIS — K5730 Diverticulosis of large intestine without perforation or abscess without bleeding: Secondary | ICD-10-CM | POA: Diagnosis not present

## 2022-07-29 DIAGNOSIS — Z1211 Encounter for screening for malignant neoplasm of colon: Secondary | ICD-10-CM | POA: Diagnosis not present

## 2022-07-29 DIAGNOSIS — D122 Benign neoplasm of ascending colon: Secondary | ICD-10-CM | POA: Diagnosis not present

## 2022-07-29 DIAGNOSIS — D120 Benign neoplasm of cecum: Secondary | ICD-10-CM | POA: Diagnosis not present

## 2022-07-29 DIAGNOSIS — K648 Other hemorrhoids: Secondary | ICD-10-CM | POA: Diagnosis not present

## 2022-07-29 DIAGNOSIS — K219 Gastro-esophageal reflux disease without esophagitis: Secondary | ICD-10-CM | POA: Insufficient documentation

## 2022-07-29 DIAGNOSIS — Z139 Encounter for screening, unspecified: Secondary | ICD-10-CM | POA: Diagnosis not present

## 2022-07-29 HISTORY — PX: COLONOSCOPY WITH PROPOFOL: SHX5780

## 2022-07-29 HISTORY — PX: POLYPECTOMY: SHX5525

## 2022-07-29 LAB — HM COLONOSCOPY

## 2022-07-29 SURGERY — COLONOSCOPY WITH PROPOFOL
Anesthesia: General

## 2022-07-29 MED ORDER — LACTATED RINGERS IV SOLN: INTRAVENOUS | Status: DC

## 2022-07-29 MED ORDER — LIDOCAINE HCL (CARDIAC) PF 100 MG/5ML IV SOSY
PREFILLED_SYRINGE | INTRAVENOUS | Status: DC | PRN
  Administered 2022-07-29: 50 mg via INTRAVENOUS

## 2022-07-29 MED ORDER — PROPOFOL 10 MG/ML IV BOLUS
INTRAVENOUS | Status: DC | PRN
  Administered 2022-07-29: 100 mg via INTRAVENOUS

## 2022-07-29 MED ORDER — PROPOFOL 500 MG/50ML IV EMUL
INTRAVENOUS | Status: DC | PRN
  Administered 2022-07-29: 125 ug/kg/min via INTRAVENOUS

## 2022-07-29 NOTE — Anesthesia Postprocedure Evaluation (Signed)
Anesthesia Post Note  Patient: [name] [name]  Procedure(s) Performed: COLONOSCOPY WITH PROPOFOL POLYPECTOMY  Patient location during evaluation: Phase II Anesthesia Type: General Level of consciousness: awake and alert and oriented Pain management: pain level controlled Vital Signs Assessment: post-procedure vital signs reviewed and stable Respiratory status: spontaneous breathing, nonlabored ventilation and respiratory function stable Cardiovascular status: blood pressure returned to baseline and stable Postop Assessment: no apparent nausea or vomiting Anesthetic complications: no  No notable events documented.   Last Vitals:  Vitals:   07/29/22 1221 07/29/22 1443  BP: (!) 158/77 122/71  Pulse: (!) 59 74  Resp: 10 14  Temp: 36.8 C 36.7 C  SpO2: 97% 100%    Last Pain:  Vitals:   07/29/22 1443  TempSrc: Oral  PainSc: 0-No pain                 Lyn Deemer C Lakechia Nay

## 2022-07-29 NOTE — Anesthesia Preprocedure Evaluation (Addendum)
Anesthesia Evaluation  Patient identified by MRN, date of birth, ID band Patient awake    Reviewed: Allergy & Precautions, H&P , NPO status , Patient's Chart, lab work & pertinent test results  Airway Mallampati: II  TM Distance: >3 FB Neck ROM: Full    Dental  (+) Dental Advisory Given Crowns :   Pulmonary neg pulmonary ROS   Pulmonary exam normal breath sounds clear to auscultation       Cardiovascular hypertension, Pt. on medications Normal cardiovascular exam Rhythm:Regular Rate:Normal     Neuro/Psych negative neurological ROS  negative psych ROS   GI/Hepatic Neg liver ROS, hiatal hernia,GERD  Medicated,,  Endo/Other  negative endocrine ROS    Renal/GU negative Renal ROS  negative genitourinary   Musculoskeletal negative musculoskeletal ROS (+)    Abdominal   Peds negative pediatric ROS (+)  Hematology negative hematology ROS (+)   Anesthesia Other Findings   Reproductive/Obstetrics negative OB ROS                             Anesthesia Physical Anesthesia Plan  ASA: 2  Anesthesia Plan: General   Post-op Pain Management: Minimal or no pain anticipated   Induction:   PONV Risk Score and Plan: Propofol infusion  Airway Management Planned: Nasal Cannula and Natural Airway  Additional Equipment:   Intra-op Plan:   Post-operative Plan:   Informed Consent: I have reviewed the patients History and Physical, chart, labs and discussed the procedure including the risks, benefits and alternatives for the proposed anesthesia with the patient or authorized representative who has indicated his/her understanding and acceptance.     Dental advisory given  Plan Discussed with: CRNA and Surgeon  Anesthesia Plan Comments:         Anesthesia Quick Evaluation

## 2022-07-29 NOTE — H&P (Signed)
[name] [name] is an 77 y.o. female.   Chief Complaint: screening colonoscopy HPI: 77 y/o F with PMH HLD, HTN, coming for screening colonoscopy.  Last colonoscopy in 2013, no polyps were found.  The patient denies having any complaints such as melena, hematochezia, abdominal pain or distention, change in her bowel movement consistency or frequency, no changes in weight recently.  No family history of colorectal cancer.   Past Medical History:  Diagnosis Date   Acid reflux    Bowel obstruction ([place])    COVID-19    H/O hiatal hernia    Hyperlipidemia    Hypertension     Past Surgical History:  Procedure Laterality Date   ABDOMINAL HYSTERECTOMY     APPENDECTOMY     BREAST CYST EXCISION Right    [phone]? benign   COLONOSCOPY  03/18/2012   Procedure: COLONOSCOPY;  Surgeon: Rogene Houston, MD;  [facility];  Service: Endoscopy;  Laterality: N/A;  930   EXPLORATORY LAPAROTOMY     LAPAROTOMY N/A 02/14/2013   Procedure: EXPLORATORY LAPAROTOMY;  Surgeon: Jamesetta So, MD;  Location: [facility];  Service: General;  Laterality: N/A;   MELANOMA EXCISION  03/2019   Dr.Hall, removed from [place]  02/2019   Melanoma removed from upper back. Dr. Nevada Crane, Jenny Reichmann     Family History  Problem Relation Age of Onset   Cancer - Other Mother        Kidney   Fibromyalgia Mother    Arthritis Mother    Cancer - Lung Father 59   Heart disease Brother 45       had heart transplant then died 3 years later after restarting smoking   Lung disease Paternal Aunt        Lung Mass   COPD Paternal Uncle    Leukemia Paternal Uncle    Stomach cancer Neg Hx    Esophageal cancer Neg Hx    Pancreatic cancer Neg Hx    Rectal cancer Neg Hx    Social History:  reports that she has never smoked. She has never been exposed to tobacco smoke. She has never used smokeless tobacco. She reports current alcohol use. She reports that she does not use drugs.  Allergies: No Known  Allergies  Medications Prior to Admission  Medication Sig Dispense Refill   amLODipine (NORVASC) 5 MG tablet Take 1 tablet (5 mg total) by mouth daily. 90 tablet 1   Ascorbic Acid (VITAMIN C PO) Take 1 tablet by mouth daily.     aspirin EC 81 MG tablet Take 81 mg by mouth daily.     beta carotene w/minerals (OCUVITE) tablet Take 1 tablet by mouth in the morning and at bedtime.     Calcium Carbonate-Vitamin D (CALCIUM 600 + D PO) Take 1 tablet by mouth 2 (two) times daily.     Cholecalciferol (VITAMIN D3) 250 MCG (10000 UT) capsule Take 10,000 Units by mouth daily.     docusate sodium (COLACE) 100 MG capsule Take 100 mg by mouth 2 (two) times daily.     Fish Oil-Cholecalciferol (FISH OIL + D3) 1000-1000 MG-UNIT CAPS Take 1 capsule by mouth 2 (two) times daily.     Lactobacillus-Inulin (CULTURELLE DIGESTIVE HEALTH PO) Take 1 capsule by mouth daily.      losartan (COZAAR) 50 MG tablet TAKE 1 TABLET DAILY 90 tablet 1   Misc Natural Products (OSTEO BI-FLEX ADV DOUBLE ST PO) Take 1 tablet by mouth 2 (two)  times daily.     Multiple Vitamin (MULTIVITAMIN WITH MINERALS) TABS Take 1 tablet by mouth daily.     omeprazole (PRILOSEC) 40 MG capsule TAKE 1 CAPSULE DAILY 90 capsule 1   polyethylene glycol powder (GLYCOLAX/MIRALAX) powder Take 17 g by mouth in the morning and at bedtime.     vitamin B-12 (CYANOCOBALAMIN) 1000 MCG tablet Take 1 tablet (1,000 mcg total) by mouth daily. 30 tablet 11   zinc gluconate 50 MG tablet Take 50 mg by mouth daily.      No results found for this or any previous visit (from the past 48 hour(s)). No results found.  Review of Systems  All other systems reviewed and are negative.   Blood pressure (!) 158/77, pulse (!) 59, temperature 98.3 F (36.8 C), temperature source Oral, resp. rate 10, height '5\' 4"'$  (1.626 m), weight 85.5 kg, last menstrual period 09/10/2014, SpO2 97 %. Physical Exam  GENERAL: The patient is AO x3, in no acute distress. HEENT: Head is  normocephalic and atraumatic. EOMI are intact. Mouth is well hydrated and without lesions. NECK: Supple. No masses LUNGS: Clear to auscultation. No presence of rhonchi/wheezing/rales. Adequate chest expansion HEART: RRR, normal s1 and s2. ABDOMEN: Soft, nontender, no guarding, no peritoneal signs, and nondistended. BS +. No masses. EXTREMITIES: Without any cyanosis, clubbing, rash, lesions or edema. NEUROLOGIC: AOx3, no focal motor deficit. SKIN: no jaundice, no rashes  Assessment/Plan 77 y/o F with PMH HLD, HTN, coming for screening colonoscopy. The patient is at average risk for colorectal cancer.  We will proceed with colonoscopy today.   Harvel Quale, MD 07/29/2022, 12:40 PM

## 2022-07-29 NOTE — Transfer of Care (Signed)
Immediate Anesthesia Transfer of Care Note  Patient: [name] [name]  Procedure(s) Performed: COLONOSCOPY WITH PROPOFOL POLYPECTOMY  Patient Location: Short Stay  Anesthesia Type:General  Level of Consciousness: awake  Airway & Oxygen Therapy: Patient Spontanous Breathing  Post-op Assessment: Report given to RN and Post -op Vital signs reviewed and stable  Post vital signs: Reviewed and stable  Last Vitals:  Vitals Value Taken Time  BP    Temp    Pulse    Resp    SpO2      Last Pain:  Vitals:   07/29/22 1403  TempSrc:   PainSc: 0-No pain         Complications: No notable events documented.

## 2022-07-29 NOTE — Op Note (Signed)
[facility] Patient Name: [name] [name] Procedure Date: 07/29/2022 1:55 PM MRN: [id number] Date of Birth: 1946-02-04 Attending MD: Maylon Peppers , , [id number] CSN: [id number] Age: 77 Admit Type: Inpatient Procedure:                Colonoscopy Indications:              Screening for colorectal malignant neoplasm Providers:                Maylon Peppers, Caprice Kluver, Randa Spike,                            Technician Referring MD:              Medicines:                Monitored Anesthesia Care Complications:            No immediate complications. Estimated Blood Loss:     Estimated blood loss: none. Procedure:                Pre-Anesthesia Assessment:                           - Prior to the procedure, a History and Physical                            was performed, and patient medications, allergies                            and sensitivities were reviewed. The patient's                            tolerance of previous anesthesia was reviewed.                           - The risks and benefits of the procedure and the                            sedation options and risks were discussed with the                            patient. All questions were answered and informed                            consent was obtained.                           - ASA Grade Assessment: II - A patient with mild                            systemic disease.                           After obtaining informed consent, the colonoscope                            was passed under direct vision. Throughout the  procedure, the patient's blood pressure, pulse, and                            oxygen saturations were monitored continuously. The                            [id number]) scope was introduced through                            the anus and advanced to the the cecum, identified                            by appendiceal orifice and ileocecal valve. The                             colonoscopy was performed without difficulty. The                            patient tolerated the procedure well. The quality                            of the bowel preparation was excellent. Scope In: 2:09:36 PM Scope Out: 2:38:47 PM Scope Withdrawal Time: 0 hours 19 minutes 39 seconds  Total Procedure Duration: 0 hours 29 minutes 11 seconds  Findings:      The perianal and digital rectal examinations were normal.      A single medium-sized angioectasia without bleeding was found in the       cecum.      Four sessile polyps were found in the transverse colon and cecum. The       polyps were 2 to 6 mm in size. These polyps were removed with a cold       snare. Resection and retrieval were complete.      A 1 mm polyp was found in the ascending colon. The polyp was sessile.       The polyp was removed with a cold biopsy forceps. Resection and       retrieval were complete.      Scattered large-mouthed and small-mouthed diverticula were found in the       sigmoid colon and descending colon.      Non-bleeding internal hemorrhoids were found during retroflexion. The       hemorrhoids were small. Impression:               - A single non-bleeding colonic angioectasia.                           - Four 2 to 6 mm polyps in the transverse colon and                            in the cecum, removed with a cold snare. Resected                            and retrieved.                           -  One 1 mm polyp in the ascending colon, removed                            with a cold biopsy forceps. Resected and retrieved.                           - Diverticulosis in the sigmoid colon and in the                            descending colon.                           - Non-bleeding internal hemorrhoids. Moderate Sedation:      Per Anesthesia Care Recommendation:           - Discharge patient to home (ambulatory).                           - Resume previous diet.                            - Await pathology results.                           - Repeat colonoscopy for surveillance based on                            pathology results. Procedure Code(s):        --- Professional ---                           [phone], Colonoscopy, flexible; with removal of                            tumor(s), polyp(s), or other lesion(s) by snare                            technique                           45380, 25, Colonoscopy, flexible; with biopsy,                            single or multiple Diagnosis Code(s):        --- Professional ---                           Z12.11, Encounter for screening for malignant                            neoplasm of colon                           K55.20, Angiodysplasia of colon without hemorrhage                           D12.3, Benign neoplasm of transverse colon (hepatic  flexure or splenic flexure)                           D12.0, Benign neoplasm of cecum                           D12.2, Benign neoplasm of ascending colon                           K64.8, Other hemorrhoids                           K57.30, Diverticulosis of large intestine without                            perforation or abscess without bleeding CPT copyright 2022 American Medical Association. All rights reserved. The codes documented in this report are preliminary and upon coder review may  be revised to meet current compliance requirements. Maylon Peppers, MD Maylon Peppers,  07/29/2022 2:42:46 PM This report has been signed electronically. Number of Addenda: 0

## 2022-07-29 NOTE — Discharge Instructions (Addendum)
You are being discharged to home.  Resume your previous diet.  We are waiting for your pathology results.  Your physician has recommended a repeat colonoscopy for surveillance based on pathology results.  

## 2022-07-30 ENCOUNTER — Encounter (INDEPENDENT_AMBULATORY_CARE_PROVIDER_SITE_OTHER): Payer: Self-pay | Admitting: *Deleted

## 2022-07-31 LAB — SURGICAL PATHOLOGY

## 2022-08-05 ENCOUNTER — Encounter (HOSPITAL_COMMUNITY): Payer: Self-pay | Admitting: Gastroenterology

## 2022-08-25 ENCOUNTER — Ambulatory Visit (INDEPENDENT_AMBULATORY_CARE_PROVIDER_SITE_OTHER): Payer: Medicare Other | Admitting: Nurse Practitioner

## 2022-08-25 ENCOUNTER — Encounter: Payer: Self-pay | Admitting: Nurse Practitioner

## 2022-08-25 VITALS — BP 118/82 | HR 94 | Temp 97.9°F | Resp 14 | Ht 64.0 in | Wt 188.2 lb

## 2022-08-25 DIAGNOSIS — K219 Gastro-esophageal reflux disease without esophagitis: Secondary | ICD-10-CM

## 2022-08-25 DIAGNOSIS — I10 Essential (primary) hypertension: Secondary | ICD-10-CM

## 2022-08-25 DIAGNOSIS — K5904 Chronic idiopathic constipation: Secondary | ICD-10-CM

## 2022-08-25 DIAGNOSIS — E785 Hyperlipidemia, unspecified: Secondary | ICD-10-CM | POA: Diagnosis not present

## 2022-08-25 DIAGNOSIS — K760 Fatty (change of) liver, not elsewhere classified: Secondary | ICD-10-CM

## 2022-08-25 DIAGNOSIS — M159 Polyosteoarthritis, unspecified: Secondary | ICD-10-CM

## 2022-08-25 NOTE — Progress Notes (Signed)
Careteam: Patient Care Team: [name], NP as PCP - General (Geriatric Medicine) Warden Fillers, MD as Consulting Physician (Ophthalmology)  PLACE OF SERVICE:  [place] Directive information    No Known Allergies  Chief Complaint  Patient presents with   Medical Management of Chronic Issues    6 month fu     HPI: Patient is a 77 y.o. female here for routine management of chronic medical issues.  She had elevated liver enzymes and was referred to GI where she was worked up for this but patient reports nothing was found. She had several lab tests done and a colonoscopy.  Denies new/changes in headaches, denies lightheadedness/dizziness, swelling in hands or feet, numbness or tingling. Denies chest pain or shortness of breath.   Does not add extra salt to her food. Does not eat out a lot and tries to stay away from fried foods. She is active and teaches fitness classes.  Review of Systems:  Review of Systems  Constitutional:  Negative for chills, fever, malaise/fatigue and weight loss.  HENT:  Negative for congestion and sore throat.   Eyes:  Negative for blurred vision.  Respiratory:  Negative for cough, shortness of breath and wheezing.   Cardiovascular:  Negative for chest pain, palpitations and leg swelling.  Gastrointestinal:  Negative for abdominal pain, blood in stool, constipation, diarrhea, heartburn, nausea and vomiting.  Genitourinary:  Negative for dysuria, frequency, hematuria and urgency.  Musculoskeletal:  Negative for falls and joint pain.  Skin:  Negative for rash.  Neurological:  Negative for dizziness, tingling and headaches.  Endo/Heme/Allergies:  Negative for polydipsia.  Psychiatric/Behavioral:  Negative for depression. The patient is not nervous/anxious.     Past Medical History:  Diagnosis Date   Acid reflux    Bowel obstruction ([place])    COVID-19    H/O hiatal hernia    Hyperlipidemia    Hypertension    Past  Surgical History:  Procedure Laterality Date   ABDOMINAL HYSTERECTOMY     APPENDECTOMY     BREAST CYST EXCISION Right    [phone]? benign   COLONOSCOPY  03/18/2012   Procedure: COLONOSCOPY;  Surgeon: Rogene Houston, MD;  [facility];  Service: Endoscopy;  Laterality: N/A;  930   COLONOSCOPY WITH PROPOFOL N/A 07/29/2022   Procedure: COLONOSCOPY WITH PROPOFOL;  Surgeon: Harvel Quale, MD;  [facility];  Service: Gastroenterology;  Laterality: N/A;  2:15 PM   EXPLORATORY LAPAROTOMY     LAPAROTOMY N/A 02/14/2013   Procedure: EXPLORATORY LAPAROTOMY;  Surgeon: Jamesetta So, MD;  Location: [facility];  Service: General;  Laterality: N/A;   MELANOMA EXCISION  03/2019   Dr.Hall, removed from [place]    POLYPECTOMY  07/29/2022   Procedure: POLYPECTOMY;  Surgeon: Harvel Quale, MD;  [facility];  Service: Gastroenterology;;   SKIN SURGERY  02/2019   Melanoma removed from upper back. Dr. Nevada Crane, Jenny Reichmann    Social History:   reports that she has never smoked. She has never been exposed to tobacco smoke. She has never used smokeless tobacco. She reports current alcohol use. She reports that she does not use drugs.  Family History  Problem Relation Age of Onset   Cancer - Other Mother        Kidney   Fibromyalgia Mother    Arthritis Mother    Cancer - Lung Father 65   Heart disease Brother 72       had  heart transplant then died 3 years later after restarting smoking   Lung disease Paternal Aunt        Lung Mass   COPD Paternal Uncle    Leukemia Paternal Uncle    Stomach cancer Neg Hx    Esophageal cancer Neg Hx    Pancreatic cancer Neg Hx    Rectal cancer Neg Hx     Medications: Patient's Medications  New Prescriptions   No medications on file  Previous Medications   AMLODIPINE (NORVASC) 5 MG TABLET    Take 1 tablet (5 mg total) by mouth daily.   ASCORBIC ACID (VITAMIN C PO)    Take 1 tablet by mouth daily.   ASPIRIN EC 81 MG TABLET     Take 81 mg by mouth daily.   BETA CAROTENE W/MINERALS (OCUVITE) TABLET    Take 1 tablet by mouth in the morning and at bedtime.   CALCIUM CARBONATE-VITAMIN D (CALCIUM 600 + D PO)    Take 1 tablet by mouth 2 (two) times daily.   CHOLECALCIFEROL (VITAMIN D3) 250 MCG (10000 UT) CAPSULE    Take 10,000 Units by mouth daily.   DOCUSATE SODIUM (COLACE) 100 MG CAPSULE    Take 100 mg by mouth 2 (two) times daily.   FISH OIL-CHOLECALCIFEROL (FISH OIL + D3) 1000-1000 MG-UNIT CAPS    Take 1 capsule by mouth 2 (two) times daily.   LACTOBACILLUS-INULIN (CULTURELLE DIGESTIVE HEALTH PO)    Take 1 capsule by mouth daily.    LOSARTAN (COZAAR) 50 MG TABLET    TAKE 1 TABLET DAILY   MISC NATURAL PRODUCTS (OSTEO BI-FLEX ADV DOUBLE ST PO)    Take 1 tablet by mouth 2 (two) times daily.   MULTIPLE VITAMIN (MULTIVITAMIN WITH MINERALS) TABS    Take 1 tablet by mouth daily.   OMEPRAZOLE (PRILOSEC) 40 MG CAPSULE    TAKE 1 CAPSULE DAILY   POLYETHYLENE GLYCOL POWDER (GLYCOLAX/MIRALAX) POWDER    Take 17 g by mouth in the morning and at bedtime.   VITAMIN B-12 (CYANOCOBALAMIN) 1000 MCG TABLET    Take 1 tablet (1,000 mcg total) by mouth daily.   ZINC GLUCONATE 50 MG TABLET    Take 50 mg by mouth daily.  Modified Medications   No medications on file  Discontinued Medications   No medications on file    Physical Exam:  Vitals:   08/25/22 1340  BP: 118/82  Pulse: 94  Resp: 14  Temp: 97.9 F (36.6 C)  TempSrc: Temporal  SpO2: 95%  Weight: 188 lb 3.2 oz (85.4 kg)  Height: 5\' 4"  (1.626 m)   Body mass index is 32.3 kg/m. Wt Readings from Last 3 Encounters:  08/25/22 188 lb 3.2 oz (85.4 kg)  07/29/22 188 lb 7.9 oz (85.5 kg)  07/25/22 188 lb 7.9 oz (85.5 kg)    Physical Exam Constitutional:      General: She is not in acute distress.    Appearance: Normal appearance.  Cardiovascular:     Rate and Rhythm: Normal rate and regular rhythm.  Pulmonary:     Effort: No respiratory distress.     Breath sounds:  Normal breath sounds.  Abdominal:     General: Bowel sounds are normal. There is no distension.     Palpations: Abdomen is soft. There is no mass.     Tenderness: There is no abdominal tenderness. There is no guarding.  Musculoskeletal:     Cervical back: Neck supple.  Lymphadenopathy:     Cervical:  No cervical adenopathy.  Skin:    General: Skin is warm and dry.  Neurological:     Mental Status: She is alert and oriented to person, place, and time.  Psychiatric:        Mood and Affect: Mood normal.     Labs reviewed: Basic Metabolic Panel: Recent Labs    02/24/22 1329  NA 140  K 4.3  CL 103  CO2 26  GLUCOSE 79  BUN 11  CREATININE 0.61  CALCIUM 9.3   Liver Function Tests: Recent Labs    02/24/22 1329  AST 86*  ALT 88*  BILITOT 0.8  PROT 6.8   No results for input(s): "LIPASE", "AMYLASE" in the last 8760 hours. No results for input(s): "AMMONIA" in the last 8760 hours. CBC: Recent Labs    02/24/22 1329  WBC 8.0  NEUTROABS 3,848  HGB 15.1  HCT 44.3  MCV 95.7  PLT 265   Lipid Panel: Recent Labs    02/24/22 1329  CHOL 170  HDL 39*  LDLCALC 97  TRIG 223*  CHOLHDL 4.4   TSH: No results for input(s): "TSH" in the last 8760 hours. A1C: Lab Results  Component Value Date   HGBA1C 5.6 03/21/2019     Assessment/Plan 1. Hepatic steatosis Pt advised to lose weight, reduce fatty and fried foods in diet, increase physical activity. Monitor for symptoms.  - Complete Metabolic Panel with eGFR  2. Hyperlipidemia, unspecified hyperlipidemia type Pt takes fish oil, off statin due to elevated LFTs -continues dietary modifications  - Complete Metabolic Panel with eGFR - Lipid panel  3. Gastroesophageal reflux disease without esophagitis Stable on omeprazole.   4. Essential hypertension Stable on amlodipine and losartan. Continue low salt diet.  - Complete Metabolic Panel with eGFR - CBC with Differential/Platelet  5. Chronic idiopathic  constipation Pt denies issues with constipation, takes colace as needed.  6. Primary osteoarthritis involving multiple joints Stable at this time, patient is active. Pt takes a joint supplement daily.    Return in about 6 months (around 02/25/2023) for routine follow up.  Student- Archer Asa O'Berry ACPCNP-S  I personally was present during the history, physical exam and medical decision-making activities of this service and have verified that the service and findings are accurately documented in the student's note Milferd Ansell K. [place], [facility] [phone]

## 2022-08-26 ENCOUNTER — Other Ambulatory Visit: Payer: Medicare Other

## 2022-08-26 DIAGNOSIS — I10 Essential (primary) hypertension: Secondary | ICD-10-CM | POA: Diagnosis not present

## 2022-08-26 DIAGNOSIS — K760 Fatty (change of) liver, not elsewhere classified: Secondary | ICD-10-CM | POA: Diagnosis not present

## 2022-08-26 DIAGNOSIS — E785 Hyperlipidemia, unspecified: Secondary | ICD-10-CM | POA: Diagnosis not present

## 2022-08-27 LAB — COMPLETE METABOLIC PANEL WITH GFR
AG Ratio: 1.4 (calc) (ref 1.0–2.5)
ALT: 76 U/L — ABNORMAL HIGH (ref 6–29)
AST: 54 U/L — ABNORMAL HIGH (ref 10–35)
Albumin: 3.9 g/dL (ref 3.6–5.1)
Alkaline phosphatase (APISO): 93 U/L (ref 37–153)
BUN: 17 mg/dL (ref 7–25)
CO2: 28 mmol/L (ref 20–32)
Calcium: 9.4 mg/dL (ref 8.6–10.4)
Chloride: 107 mmol/L (ref 98–110)
Creat: 0.69 mg/dL (ref 0.60–1.00)
Globulin: 2.8 g/dL (calc) (ref 1.9–3.7)
Glucose, Bld: 91 mg/dL (ref 65–99)
Potassium: 4.4 mmol/L (ref 3.5–5.3)
Sodium: 142 mmol/L (ref 135–146)
Total Bilirubin: 0.6 mg/dL (ref 0.2–1.2)
Total Protein: 6.7 g/dL (ref 6.1–8.1)
eGFR: 90 mL/min/{1.73_m2} (ref >=60)

## 2022-08-27 LAB — LIPID PANEL
Cholesterol: 161 mg/dL (ref <200)
HDL: 42 mg/dL — ABNORMAL LOW (ref >=50)
LDL Cholesterol (Calc): 93 mg/dL (calc)
Non-HDL Cholesterol (Calc): 119 mg/dL (calc) (ref <130)
Total CHOL/HDL Ratio: 3.8 (calc) (ref <5.0)
Triglycerides: 162 mg/dL — ABNORMAL HIGH (ref <150)

## 2022-08-27 LAB — CBC WITH DIFFERENTIAL/PLATELET
Absolute Monocytes: 698 cells/uL (ref 200–950)
Basophils Absolute: 101 cells/uL (ref 0–200)
Basophils Relative: 1.4 %
Eosinophils Absolute: 259 cells/uL (ref 15–500)
Eosinophils Relative: 3.6 %
HCT: 43.9 % (ref 35.0–45.0)
Hemoglobin: 15 g/dL (ref 11.7–15.5)
Lymphs Abs: 2621 cells/uL (ref 850–3900)
MCH: 31.8 pg (ref 27.0–33.0)
MCHC: 34.2 g/dL (ref 32.0–36.0)
MCV: 93 fL (ref 80.0–100.0)
MPV: 12.7 fL — ABNORMAL HIGH (ref 7.5–12.5)
Monocytes Relative: 9.7 %
Neutro Abs: 3521 cells/uL (ref 1500–7800)
Neutrophils Relative %: 48.9 %
Platelets: 250 10*3/uL (ref 140–400)
RBC: 4.72 10*6/uL (ref 3.80–5.10)
RDW: 12.1 % (ref 11.0–15.0)
Total Lymphocyte: 36.4 %
WBC: 7.2 10*3/uL (ref 3.8–10.8)

## 2022-09-08 ENCOUNTER — Other Ambulatory Visit: Payer: Self-pay | Admitting: Nurse Practitioner

## 2022-09-29 ENCOUNTER — Other Ambulatory Visit: Payer: Self-pay | Admitting: Nurse Practitioner

## 2022-11-04 ENCOUNTER — Ambulatory Visit (INDEPENDENT_AMBULATORY_CARE_PROVIDER_SITE_OTHER): Payer: Medicare Other | Admitting: Gastroenterology

## 2022-12-08 ENCOUNTER — Other Ambulatory Visit: Payer: Self-pay | Admitting: Nurse Practitioner

## 2022-12-11 DIAGNOSIS — D225 Melanocytic nevi of trunk: Secondary | ICD-10-CM | POA: Diagnosis not present

## 2022-12-11 DIAGNOSIS — Z08 Encounter for follow-up examination after completed treatment for malignant neoplasm: Secondary | ICD-10-CM | POA: Diagnosis not present

## 2022-12-11 DIAGNOSIS — Z1283 Encounter for screening for malignant neoplasm of skin: Secondary | ICD-10-CM | POA: Diagnosis not present

## 2022-12-11 DIAGNOSIS — Z85820 Personal history of malignant melanoma of skin: Secondary | ICD-10-CM | POA: Diagnosis not present

## 2023-02-23 ENCOUNTER — Ambulatory Visit: Payer: Medicare Other

## 2023-02-23 ENCOUNTER — Encounter: Payer: Self-pay | Admitting: Nurse Practitioner

## 2023-02-23 ENCOUNTER — Ambulatory Visit (INDEPENDENT_AMBULATORY_CARE_PROVIDER_SITE_OTHER): Payer: Medicare Other | Admitting: Nurse Practitioner

## 2023-02-23 VITALS — BP 134/88 | HR 52 | Temp 97.7°F | Ht 64.0 in | Wt 187.2 lb

## 2023-02-23 DIAGNOSIS — M159 Polyosteoarthritis, unspecified: Secondary | ICD-10-CM | POA: Diagnosis not present

## 2023-02-23 DIAGNOSIS — E785 Hyperlipidemia, unspecified: Secondary | ICD-10-CM | POA: Diagnosis not present

## 2023-02-23 DIAGNOSIS — Z23 Encounter for immunization: Secondary | ICD-10-CM

## 2023-02-23 DIAGNOSIS — I10 Essential (primary) hypertension: Secondary | ICD-10-CM | POA: Diagnosis not present

## 2023-02-23 DIAGNOSIS — K760 Fatty (change of) liver, not elsewhere classified: Secondary | ICD-10-CM | POA: Diagnosis not present

## 2023-02-23 DIAGNOSIS — Z66 Do not resuscitate: Secondary | ICD-10-CM

## 2023-02-23 DIAGNOSIS — K5904 Chronic idiopathic constipation: Secondary | ICD-10-CM | POA: Diagnosis not present

## 2023-02-23 DIAGNOSIS — K219 Gastro-esophageal reflux disease without esophagitis: Secondary | ICD-10-CM

## 2023-02-23 NOTE — Progress Notes (Unsigned)
Careteam: Patient Care Team: Sharon Seller, NP as PCP - General (Geriatric Medicine) Sallye Lat, MD as Consulting Physician (Ophthalmology)  PLACE OF SERVICE:  [facility]  Advanced Directive information Does Patient Have a Medical Advance Directive?: Yes, Type of Advance Directive: Healthcare Power of [place];Out of facility DNR (pink MOST or yellow form);Living will, Pre-existing out of facility DNR order (yellow form or pink MOST form): Yellow form placed in chart (order not valid for inpatient use);Pink MOST form placed in chart (order not valid for inpatient use), Does patient want to make changes to medical advance directive?: No - Patient declined  No Known Allergies  Chief Complaint  Patient presents with   Medical Management of Chronic Issues    6 month follow-up. Discuss need for flu vaccine and covid booster. AWV pending for October 2024. NCIR verified.      HPI: Patient is a 77 y.o. female for routine follow up  She had a fall on a trip 9/11 that she was on- ground was uneven and stepped wrong  She had Rx naprosyn that has help with Voltaren gel.   Contstipation well controlled  GERD controlled  Bp looks good  Still remaining active.   Review of Systems:  ROS***  Past Medical History:  Diagnosis Date   Acid reflux    Bowel obstruction (HCC)    COVID-19    H/O hiatal hernia    Hyperlipidemia    Hypertension    Past Surgical History:  Procedure Laterality Date   ABDOMINAL HYSTERECTOMY     APPENDECTOMY     BREAST CYST EXCISION Right    [phone]? benign   COLONOSCOPY  03/18/2012   Procedure: COLONOSCOPY;  Surgeon: Malissa Hippo, MD;  [facility];  Service: Endoscopy;  Laterality: N/A;  930   COLONOSCOPY WITH PROPOFOL N/A 07/29/2022   Procedure: COLONOSCOPY WITH PROPOFOL;  Surgeon: Dolores Frame, MD;  [facility];  Service: Gastroenterology;  Laterality: N/A;  2:15 PM   EXPLORATORY LAPAROTOMY      LAPAROTOMY N/A 02/14/2013   Procedure: EXPLORATORY LAPAROTOMY;  Surgeon: Dalia Heading, MD;  Location: [facility];  Service: General;  Laterality: N/A;   MELANOMA EXCISION  03/2019   Dr.Hall, removed from nack    POLYPECTOMY  07/29/2022   Procedure: POLYPECTOMY;  Surgeon: Dolores Frame, MD;  [facility];  Service: Gastroenterology;;   SKIN SURGERY  02/2019   Melanoma removed from upper back. Dr. Margo Aye, [place] [facility]    Social History:   reports that she has never smoked. She has never been exposed to tobacco smoke. She has never used smokeless tobacco. She reports current alcohol use. She reports that she does not use drugs.  Family History  Problem Relation Age of Onset   Cancer - Other Mother        Kidney   Fibromyalgia Mother    Arthritis Mother    Cancer - Lung Father 57   Heart disease Brother 29       had heart transplant then died 3 years later after restarting smoking   Lung disease Paternal Aunt        Lung Mass   COPD Paternal Uncle    Leukemia Paternal Uncle    Stomach cancer Neg Hx    Esophageal cancer Neg Hx    Pancreatic cancer Neg Hx    Rectal cancer Neg Hx     Medications: Patient's Medications  New Prescriptions   No medications on  file  Previous Medications   AMLODIPINE (NORVASC) 5 MG TABLET    TAKE 1 TABLET DAILY   ASCORBIC ACID (VITAMIN C PO)    Take 1 tablet by mouth daily.   ASPIRIN EC 81 MG TABLET    Take 81 mg by mouth daily.   BETA CAROTENE W/MINERALS (OCUVITE) TABLET    Take 1 tablet by mouth in the morning and at bedtime.   CALCIUM CARBONATE-VITAMIN D (CALCIUM 600 + D PO)    Take 1 tablet by mouth 2 (two) times daily.   CHOLECALCIFEROL (VITAMIN D3) 250 MCG (10000 UT) CAPSULE    Take 10,000 Units by mouth daily.   DOCUSATE SODIUM (COLACE) 100 MG CAPSULE    Take 100 mg by mouth 2 (two) times daily.   FISH OIL-CHOLECALCIFEROL (FISH OIL + D3) 1000-1000 MG-UNIT CAPS    Take 1 capsule by mouth 2 (two) times daily.   LACTOBACILLUS-INULIN  (CULTURELLE DIGESTIVE HEALTH PO)    Take 1 capsule by mouth daily.    LOSARTAN (COZAAR) 50 MG TABLET    TAKE 1 TABLET DAILY   MISC NATURAL PRODUCTS (OSTEO BI-FLEX ADV DOUBLE ST PO)    Take 1 tablet by mouth 2 (two) times daily.   NAPROXEN (NAPROSYN) 500 MG TABLET    Take 500 mg by mouth 2 (two) times daily.   OMEPRAZOLE (PRILOSEC) 40 MG CAPSULE    TAKE 1 CAPSULE DAILY   POLYETHYLENE GLYCOL POWDER (GLYCOLAX/MIRALAX) POWDER    Take 17 g by mouth in the morning and at bedtime.   VITAMIN B-12 (CYANOCOBALAMIN) 1000 MCG TABLET    Take 1 tablet (1,000 mcg total) by mouth daily.   ZINC GLUCONATE 50 MG TABLET    Take 50 mg by mouth daily.  Modified Medications   No medications on file  Discontinued Medications   MULTIPLE VITAMIN (MULTIVITAMIN WITH MINERALS) TABS    Take 1 tablet by mouth daily.    Physical Exam:  Vitals:   02/23/23 0924  BP: 134/88  Pulse: (!) 52  Temp: 97.7 F (36.5 C)  SpO2: 94%  Weight: 187 lb 3.2 oz (84.9 kg)  Height: 5\' 4"  (1.626 m)   Body mass index is 32.13 kg/m. Wt Readings from Last 3 Encounters:  02/23/23 187 lb 3.2 oz (84.9 kg)  08/25/22 188 lb 3.2 oz (85.4 kg)  07/29/22 188 lb 7.9 oz (85.5 kg)    Physical Exam***  Labs reviewed: Basic Metabolic Panel: Recent Labs    02/24/22 1329 08/26/22 0838  NA 140 142  K 4.3 4.4  CL 103 107  CO2 26 28  GLUCOSE 79 91  BUN 11 17  CREATININE 0.61 0.69  CALCIUM 9.3 9.4   Liver Function Tests: Recent Labs    02/24/22 1329 08/26/22 0838  AST 86* 54*  ALT 88* 76*  BILITOT 0.8 0.6  PROT 6.8 6.7   No results for input(s): "LIPASE", "AMYLASE" in the last 8760 hours. No results for input(s): "AMMONIA" in the last 8760 hours. CBC: Recent Labs    02/24/22 1329 08/26/22 0838  WBC 8.0 7.2  NEUTROABS 3,848 3,521  HGB 15.1 15.0  HCT 44.3 43.9  MCV 95.7 93.0  PLT 265 250   Lipid Panel: Recent Labs    02/24/22 1329 08/26/22 0838  CHOL 170 161  HDL 39* 42*  LDLCALC 97 93  TRIG 223* 162*  CHOLHDL  4.4 3.8   TSH: No results for input(s): "TSH" in the last 8760 hours. A1C: Lab Results  Component Value  Date   HGBA1C 5.6 03/21/2019     Assessment/Plan 1. DNR (do not resuscitate) ***  2. Need for influenza vaccination *** - Flu Vaccine Trivalent High Dose (Fluad)   No follow-ups on file.: ***  Barbie Croston K. Biagio Borg [facility] [phone]

## 2023-02-24 LAB — LIPID PANEL
Cholesterol: 158 mg/dL (ref <200)
HDL: 38 mg/dL — ABNORMAL LOW (ref >=50)
LDL Cholesterol (Calc): 84 mg/dL (calc)
Non-HDL Cholesterol (Calc): 120 mg/dL (calc) (ref <130)
Total CHOL/HDL Ratio: 4.2 (calc) (ref <5.0)
Triglycerides: 272 mg/dL — ABNORMAL HIGH (ref <150)

## 2023-02-24 LAB — CBC WITH DIFFERENTIAL/PLATELET
Absolute Monocytes: 931 cells/uL (ref 200–950)
Basophils Absolute: 108 cells/uL (ref 0–200)
Basophils Relative: 1.1 %
Eosinophils Absolute: 225 cells/uL (ref 15–500)
Eosinophils Relative: 2.3 %
HCT: 42.8 % (ref 35.0–45.0)
Hemoglobin: 14.3 g/dL (ref 11.7–15.5)
Lymphs Abs: 3116 cells/uL (ref 850–3900)
MCH: 31.7 pg (ref 27.0–33.0)
MCHC: 33.4 g/dL (ref 32.0–36.0)
MCV: 94.9 fL (ref 80.0–100.0)
MPV: 12.3 fL (ref 7.5–12.5)
Monocytes Relative: 9.5 %
Neutro Abs: 5419 cells/uL (ref 1500–7800)
Neutrophils Relative %: 55.3 %
Platelets: 281 10*3/uL (ref 140–400)
RBC: 4.51 10*6/uL (ref 3.80–5.10)
RDW: 12.1 % (ref 11.0–15.0)
Total Lymphocyte: 31.8 %
WBC: 9.8 10*3/uL (ref 3.8–10.8)

## 2023-02-24 LAB — COMPLETE METABOLIC PANEL WITH GFR
AG Ratio: 1.5 (calc) (ref 1.0–2.5)
ALT: 70 U/L — ABNORMAL HIGH (ref 6–29)
AST: 65 U/L — ABNORMAL HIGH (ref 10–35)
Albumin: 4 g/dL (ref 3.6–5.1)
Alkaline phosphatase (APISO): 105 U/L (ref 37–153)
BUN/Creatinine Ratio: 26 (calc) — ABNORMAL HIGH (ref 6–22)
BUN: 14 mg/dL (ref 7–25)
CO2: 29 mmol/L (ref 20–32)
Calcium: 9.3 mg/dL (ref 8.6–10.4)
Chloride: 105 mmol/L (ref 98–110)
Creat: 0.53 mg/dL — ABNORMAL LOW (ref 0.60–1.00)
Globulin: 2.7 g/dL (calc) (ref 1.9–3.7)
Glucose, Bld: 82 mg/dL (ref 65–139)
Potassium: 4.5 mmol/L (ref 3.5–5.3)
Sodium: 143 mmol/L (ref 135–146)
Total Bilirubin: 0.7 mg/dL (ref 0.2–1.2)
Total Protein: 6.7 g/dL (ref 6.1–8.1)
eGFR: 96 mL/min/{1.73_m2} (ref >=60)

## 2023-03-09 ENCOUNTER — Other Ambulatory Visit: Payer: Self-pay | Admitting: Nurse Practitioner

## 2023-03-23 ENCOUNTER — Encounter: Payer: Self-pay | Admitting: Nurse Practitioner

## 2023-03-23 ENCOUNTER — Ambulatory Visit (INDEPENDENT_AMBULATORY_CARE_PROVIDER_SITE_OTHER): Payer: Medicare Other | Admitting: Nurse Practitioner

## 2023-03-23 VITALS — BP 140/86 | HR 88 | Temp 97.5°F | Resp 20 | Ht 64.0 in | Wt 184.0 lb

## 2023-03-23 DIAGNOSIS — E2839 Other primary ovarian failure: Secondary | ICD-10-CM | POA: Diagnosis not present

## 2023-03-23 DIAGNOSIS — Z0000 Encounter for general adult medical examination without abnormal findings: Secondary | ICD-10-CM | POA: Diagnosis not present

## 2023-03-23 NOTE — Patient Instructions (Signed)
  [name] [name] , Thank you for taking time to come for your Medicare Wellness Visit. I appreciate your ongoing commitment to your health goals. Please review the following plan we discussed and let me know if I can assist you in the future.     This is a list of the screening recommended for you and due dates:  Health Maintenance  Topic Date Due   COVID-19 Vaccine (7 - 2023-24 season) 02/01/2023   Medicare Annual Wellness Visit  03/22/2024   Colon Cancer Screening  07/29/2025   DEXA scan (bone density measurement)  12/25/2025   DTaP/Tdap/Td vaccine (2 - Td or Tdap) 03/24/2028   Pneumonia Vaccine  Completed   Flu Shot  Completed   Hepatitis C Screening  Completed   Zoster (Shingles) Vaccine  Completed   HPV Vaccine  Aged Out

## 2023-03-23 NOTE — Progress Notes (Signed)
Subjective:   [name] [name] is a 77 y.o. female who presents for Medicare Annual (Subsequent) preventive examination.  Visit Complete: In person at [facility]   Cardiac Risk Factors include: advanced age (>72men, >32 women);hypertension;obesity (BMI >30kg/m2)     Objective:    Today's Vitals   03/23/23 1528 03/23/23 1548  BP: (!) 144/92 (!) 140/86  Pulse: 88   Resp: 20   Temp: (!) 97.5 F (36.4 C)   SpO2: 96%   Weight: 184 lb (83.5 kg)   Height: 5\' 4"  (1.626 m)   PainSc: 0-No pain    Body mass index is 31.58 kg/m.     03/23/2023    3:32 PM 02/23/2023    1:00 PM 02/23/2023    9:25 AM 07/29/2022   12:17 PM 07/25/2022   11:45 AM 03/07/2022    3:11 PM 08/19/2021   10:34 AM  Advanced Directives  Does Patient Have a Medical Advance Directive? Yes Yes Yes Yes Yes Yes Yes  Type of Estate agent of [place];Living will;Out of facility DNR (pink MOST or yellow form) Healthcare Power of [place];Out of facility DNR (pink MOST or yellow form);Living will Out of facility DNR (pink MOST or yellow form) Healthcare Power of [place];Living will Living will;Healthcare Power of [employer] Power of [place];Living will;Out of facility DNR (pink MOST or yellow form) Healthcare Power of [place];Living will  Does patient want to make changes to medical advance directive? No - Patient declined No - Patient declined No - Patient declined   No - Patient declined No - Patient declined  Copy of Healthcare Power of Attorney in Chart? Yes - validated most recent copy scanned in chart (See row information) Yes - validated most recent copy scanned in chart (See row information)  No - copy requested  Yes - validated most recent copy scanned in chart (See row information) Yes - validated most recent copy scanned in chart (See row information)  Would patient like information on creating a medical advance directive?     No - Patient declined    Pre-existing out of facility DNR  order (yellow form or pink MOST form) Pink MOST/Yellow Form most recent copy in chart - Physician notified to receive inpatient order  Yellow form placed in chart (order not valid for inpatient use);Pink MOST form placed in chart (order not valid for inpatient use)   Yellow form placed in chart (order not valid for inpatient use)     Current Medications (verified) Outpatient Encounter Medications as of 03/23/2023  Medication Sig   amLODipine (NORVASC) 5 MG tablet TAKE 1 TABLET DAILY   Ascorbic Acid (VITAMIN C PO) Take 1 tablet by mouth daily.   aspirin EC 81 MG tablet Take 81 mg by mouth daily.   beta carotene w/minerals (OCUVITE) tablet Take 1 tablet by mouth in the morning and at bedtime.   Calcium Carbonate-Vitamin D (CALCIUM 600 + D PO) Take 1 tablet by mouth 2 (two) times daily.   Cholecalciferol (VITAMIN D3) 250 MCG (10000 UT) capsule Take 10,000 Units by mouth daily.   docusate sodium (COLACE) 100 MG capsule Take 100 mg by mouth 2 (two) times daily.   Fish Oil-Cholecalciferol (FISH OIL + D3) 1000-1000 MG-UNIT CAPS Take 1 capsule by mouth 2 (two) times daily.   Lactobacillus-Inulin (CULTURELLE DIGESTIVE HEALTH PO) Take 1 capsule by mouth daily.    losartan (COZAAR) 50 MG tablet TAKE 1 TABLET DAILY   Misc Natural Products (OSTEO BI-FLEX ADV DOUBLE ST PO) Take  1 tablet by mouth 2 (two) times daily.   omeprazole (PRILOSEC) 40 MG capsule TAKE 1 CAPSULE DAILY   polyethylene glycol powder (GLYCOLAX/MIRALAX) powder Take 17 g by mouth in the morning and at bedtime.   vitamin B-12 (CYANOCOBALAMIN) 1000 MCG tablet Take 1 tablet (1,000 mcg total) by mouth daily.   zinc gluconate 50 MG tablet Take 50 mg by mouth daily.   [DISCONTINUED] naproxen (NAPROSYN) 500 MG tablet Take 500 mg by mouth 2 (two) times daily.   No facility-administered encounter medications on file as of 03/23/2023.    Allergies (verified) Patient has no known allergies.   History: Past Medical History:  Diagnosis Date    Acid reflux    Bowel obstruction (HCC)    COVID-19    H/O hiatal hernia    Hyperlipidemia    Hypertension    Past Surgical History:  Procedure Laterality Date   ABDOMINAL HYSTERECTOMY     APPENDECTOMY     BREAST CYST EXCISION Right    [phone]? benign   COLONOSCOPY  03/18/2012   Procedure: COLONOSCOPY;  Surgeon: Malissa Hippo, MD;  [facility];  Service: Endoscopy;  Laterality: N/A;  930   COLONOSCOPY WITH PROPOFOL N/A 07/29/2022   Procedure: COLONOSCOPY WITH PROPOFOL;  Surgeon: Dolores Frame, MD;  [facility];  Service: Gastroenterology;  Laterality: N/A;  2:15 PM   EXPLORATORY LAPAROTOMY     LAPAROTOMY N/A 02/14/2013   Procedure: EXPLORATORY LAPAROTOMY;  Surgeon: Dalia Heading, MD;  Location: [facility];  Service: General;  Laterality: N/A;   MELANOMA EXCISION  03/2019   Dr.Hall, removed from nack    POLYPECTOMY  07/29/2022   Procedure: POLYPECTOMY;  Surgeon: Dolores Frame, MD;  [facility];  Service: Gastroenterology;;   SKIN SURGERY  02/2019   Melanoma removed from upper back. Dr. Margo Aye, [place]    Family History  Problem Relation Age of Onset   Cancer - Other Mother        Kidney   Fibromyalgia Mother    Arthritis Mother    Cancer - Lung Father 80   Heart disease Brother 93       had heart transplant then died 3 years later after restarting smoking   Lung disease Paternal Aunt        Lung Mass   COPD Paternal Uncle    Leukemia Paternal Uncle    Stomach cancer Neg Hx    Esophageal cancer Neg Hx    Pancreatic cancer Neg Hx    Rectal cancer Neg Hx    Social History   Socioeconomic History   Marital status: Single    Spouse name: Not on file   Number of children: Not on file   Years of education: Not on file   Highest education level: Not on file  Occupational History   Not on file  Tobacco Use   Smoking status: Never    Passive exposure: Never   Smokeless tobacco: Never  Vaping Use   Vaping status:  Never Used  Substance and Sexual Activity   Alcohol use: Yes    Comment: occassionally   Drug use: No   Sexual activity: Not Currently  Other Topics Concern   Not on file  Social History Narrative   Social History      Diet?       Do you drink/eat things with caffeine? yes      Marital status?  single                        What year were you married?      Do you live in a house, apartment, assisted living, condo, trailer, etc.? house      Is it one or more stories? Yes but seldom go upstairs      How many persons live in your home? 1       Do you have any pets in your home? (please list) no       Highest level of education completed? 12- business college      Current or past profession: retired      Do you exercise?                  yes                    Type & how often? 5 days a week- 1 hour per day      Advanced Directives      Do you have a living will? yes      Do you have a DNR form?                                  If not, do you want to discuss one? yes      Do you have signed POA/HPOA for forms?  yes      Functional Status      Do you have difficulty bathing or dressing yourself? no      Do you have difficulty preparing food or eating? no      Do you have difficulty managing your medications? no      Do you have difficulty managing your finances?  no      Do you have difficulty affording your medications?  no      Social Determinants of Health   Financial Resource Strain: Not on file  Food Insecurity: Not on file  Transportation Needs: Not on file  Physical Activity: Not on file  Stress: Not on file  Social Connections: Not on file    Tobacco Counseling Counseling given: Not Answered   Clinical Intake:  Pre-visit preparation completed: Yes  Pain : No/denies pain Pain Score: 0-No pain     BMI - recorded: 31.58 Nutritional Status: BMI > 30  Obese Nutritional Risks: None Diabetes: No  How often do you need to have  someone help you when you read instructions, pamphlets, or other written materials from your doctor or pharmacy?: 1 - Never What is the last grade level you completed in school?: College  Interpreter Needed?: No  Information entered by :: Porsha McClurkin,CMA   Activities of Daily Living    03/23/2023    3:31 PM 07/25/2022   11:47 AM  In your present state of health, do you have any difficulty performing the following activities:  Hearing? 0   Vision? 0   Difficulty concentrating or making decisions? 0   Walking or climbing stairs? 0   Dressing or bathing? 0   Doing errands, shopping? 0 0  Preparing Food and eating ? N   Using the Toilet? N   In the past six months, have you accidently leaked urine? N   Do you have problems with loss of bowel control? N   Managing your Medications? N   Managing your Finances?  N   Housekeeping or managing your Housekeeping? N     Patient Care Team: Sharon Seller, NP as PCP - General (Geriatric Medicine) Sallye Lat, MD as Consulting Physician (Ophthalmology)  Indicate any recent Medical Services you may have received from other than [facility] providers in the past year (date may be approximate).     Assessment:   This is a routine wellness examination for Jayelle.  Hearing/Vision screen No results found.   Goals Addressed   None    Depression Screen    03/23/2023    3:34 PM 02/23/2023   12:59 PM 03/07/2022    3:12 PM 02/19/2021    9:33 AM 04/11/2020    3:13 PM 02/16/2020    8:55 AM 02/14/2019   11:02 AM  PHQ 2/9 Scores  PHQ - 2 Score 0 0 0 0 0 0 0    Fall Risk    03/23/2023    3:34 PM 02/23/2023   12:59 PM 03/07/2022    3:11 PM 02/24/2022    1:05 PM 08/19/2021    1:04 PM  Fall Risk   Falls in the past year? 0 1 1 1  0  Number falls in past yr: 0 0 1 1 0  Injury with Fall? 0 1 1 0 0  Risk for fall due to : No Fall Risks  History of fall(s) History of fall(s) No Fall Risks  Follow up Falls evaluation completed    Falls  evaluation completed    MEDICARE RISK AT HOME: Medicare Risk at Home Any stairs in or around the home?: Yes If so, are there any without handrails?: No Home free of loose throw rugs in walkways, pet beds, electrical cords, etc?: Yes Adequate lighting in your home to reduce risk of falls?: Yes Life alert?: Yes Use of a cane, walker or w/c?: No Grab bars in the bathroom?: Yes Shower chair or bench in shower?: No Elevated toilet seat or a handicapped toilet?: Yes  TIMED UP AND GO:  Was the test performed?  No    Cognitive Function:    03/23/2023    3:38 PM 02/14/2019   11:13 AM  MMSE - Mini Mental State Exam  Orientation to time 5 4  Orientation to Place 5 5  Registration 3 3  Attention/ Calculation 5 5  Recall 2 3  Language- name 2 objects 2 2  Language- repeat 1 1  Language- follow 3 step command 3 3  Language- read & follow direction 1 1  Write a sentence 1 1  Copy design 1 1  Total score 29 29        03/07/2022    3:14 PM 02/19/2021    9:35 AM 02/16/2020    8:58 AM  6CIT Screen  What Year? 0 points 0 points 0 points  What month? 0 points 0 points 0 points  What time? 0 points 0 points 0 points  Count back from 20 0 points 0 points 0 points  Months in reverse 0 points 0 points 0 points  Repeat phrase 0 points 0 points 2 points  Total Score 0 points 0 points 2 points    Immunizations Immunization History  Administered Date(s) Administered   Fluad Quad(high Dose 65+) 02/14/2019, 02/11/2021, 02/24/2022   Fluad Trivalent(High Dose 65+) 02/23/2023   Influenza, High Dose Seasonal PF 03/26/2017, 03/23/2018, 04/03/2020   Influenza,inj,Quad PF,6+ Mos 02/13/2013, 03/26/2015   Influenza-Unspecified 06/02/2016   Moderna Covid-19 Vaccine Bivalent Booster 68yrs & up 03/19/2022   PFIZER(Purple Top)SARS-COV-2  Vaccination 06/24/2019, 07/15/2019, 02/27/2020, 10/24/2020   PPD Test 08/10/2018   Pfizer Covid-19 Vaccine Bivalent Booster 45yrs & up 04/22/2021   Pneumococcal  Conjugate-13 03/24/2014   Pneumococcal Polysaccharide-23 04/23/2018   Rsv, Bivalent, Protein Subunit Rsvpref,pf Verdis Frederickson) 03/19/2022   Tdap 03/24/2018   Tetanus 06/02/2001   Zoster Recombinant(Shingrix) 03/24/2018, 06/01/2018   Zoster, Live 06/02/2012    TDAP status: Up to date  Flu Vaccine status: Up to date  Pneumococcal vaccine status: Up to date  Covid-19 vaccine status: Information provided on how to obtain vaccines.   Qualifies for Shingles Vaccine? Yes   Zostavax completed No   Shingrix Completed?: Yes  Screening Tests Health Maintenance  Topic Date Due   COVID-19 Vaccine (7 - 2023-24 season) 02/01/2023   Medicare Annual Wellness (AWV)  03/22/2024   Colonoscopy  07/29/2025   DEXA SCAN  12/25/2025   DTaP/Tdap/Td (2 - Td or Tdap) 03/24/2028   Pneumonia Vaccine 15+ Years old  Completed   INFLUENZA VACCINE  Completed   Hepatitis C Screening  Completed   Zoster Vaccines- Shingrix  Completed   HPV VACCINES  Aged Out    Health Maintenance  Health Maintenance Due  Topic Date Due   COVID-19 Vaccine (7 - 2023-24 season) 02/01/2023    Up to date on colorectal screening  Mammogram status: No longer required due to age.  Bone Density status: Completed 2022. Results reflect: Bone density results: NORMAL. Repeat every 5 years.  Lung Cancer Screening: (Low Dose CT Chest recommended if Age 63-80 years, 20 pack-year currently smoking OR have quit w/in 15years.) does not qualify.   Lung Cancer Screening Referral: na Additional Screening:  Hepatitis C Screening: does qualify; Completed   Vision Screening: Recommended annual ophthalmology exams for early detection of glaucoma and other disorders of the eye. Is the patient up to date with their annual eye exam?  Yes  Who is the provider or what is the name of the office in which the patient attends annual eye exams? [facility] If pt is not established with a provider, would they like to be referred to a provider to establish  care? No .   Dental Screening: Recommended annual dental exams for proper oral hygiene   Community Resource Referral / Chronic Care Management: CRR required this visit?  No   CCM required this visit?  No     Plan:     I have personally reviewed and noted the following in the patient's chart:   Medical and social history Use of alcohol, tobacco or illicit drugs  Current medications and supplements including opioid prescriptions. Patient is not currently taking opioid prescriptions. Functional ability and status Nutritional status Physical activity Advanced directives List of other physicians Hospitalizations, surgeries, and ER visits in previous 12 months Vitals Screenings to include cognitive, depression, and falls Referrals and appointments  In addition, I have reviewed and discussed with patient certain preventive protocols, quality metrics, and best practice recommendations. A written personalized care plan for preventive services as well as general preventive health recommendations were provided to patient.     Sharon Seller, NP   03/23/2023

## 2023-04-16 ENCOUNTER — Encounter: Payer: Self-pay | Admitting: Nurse Practitioner

## 2023-04-16 DIAGNOSIS — Z23 Encounter for immunization: Secondary | ICD-10-CM | POA: Diagnosis not present

## 2023-06-08 ENCOUNTER — Other Ambulatory Visit: Payer: Self-pay | Admitting: Nurse Practitioner

## 2023-06-11 DIAGNOSIS — Z08 Encounter for follow-up examination after completed treatment for malignant neoplasm: Secondary | ICD-10-CM | POA: Diagnosis not present

## 2023-06-11 DIAGNOSIS — Z85820 Personal history of malignant melanoma of skin: Secondary | ICD-10-CM | POA: Diagnosis not present

## 2023-06-11 DIAGNOSIS — X32XXXD Exposure to sunlight, subsequent encounter: Secondary | ICD-10-CM | POA: Diagnosis not present

## 2023-06-11 DIAGNOSIS — Z1283 Encounter for screening for malignant neoplasm of skin: Secondary | ICD-10-CM | POA: Diagnosis not present

## 2023-06-11 DIAGNOSIS — D225 Melanocytic nevi of trunk: Secondary | ICD-10-CM | POA: Diagnosis not present

## 2023-06-11 DIAGNOSIS — L570 Actinic keratosis: Secondary | ICD-10-CM | POA: Diagnosis not present

## 2023-06-29 ENCOUNTER — Ambulatory Visit
Admission: RE | Admit: 2023-06-29 | Discharge: 2023-06-29 | Disposition: A | Discharge status: Home / Self Care | Payer: Medicare Other | Source: Ambulatory Visit | Attending: Nurse Practitioner | Admitting: Nurse Practitioner

## 2023-06-29 VITALS — BP 162/91 | HR 71 | Temp 98.0°F | Resp 18

## 2023-06-29 DIAGNOSIS — S29012A Strain of muscle and tendon of back wall of thorax, initial encounter: Secondary | ICD-10-CM | POA: Diagnosis not present

## 2023-06-29 MED ORDER — TIZANIDINE HCL 4 MG PO CAPS: 4.0000 mg | ORAL_CAPSULE | Freq: Two times a day (BID) | ORAL | 0 refills | Status: AC | PRN

## 2023-06-29 MED ORDER — DEXAMETHASONE SODIUM PHOSPHATE 10 MG/ML IJ SOLN
10.0000 mg | INTRAMUSCULAR | Status: AC
  Administered 2023-06-29: 10 mg via INTRAMUSCULAR

## 2023-06-29 NOTE — ED Provider Notes (Signed)
[facility]    CSN: [id number] Arrival date & time: 06/29/23  4098      History   Chief Complaint Chief Complaint  Patient presents with   Back Pain    Muscle spasms left side - Entered by patient    HPI [name] [name] is a 78 y.o. female.   The history is provided by the patient.   Patient presents for complaints of left-sided back pain that started over the past 1 to 2 days.  Patient states prior to symptoms starting, she mopped the floor.  She states that shortly thereafter, she developed "spasms" in the left side of her back.  She states that the pain and spasm go from the top of her back to the mid back.  She states that the area is "sore."  Denies numbness, tingling, decreased grip strength, decreased range of motion, bruising, swelling, injury, or trauma.  Patient denies history of back pain.  States that she has been using over-the-counter medications and using heating pads along with hot showers for her symptoms with temporary relief.  Past Medical History:  Diagnosis Date   Acid reflux    Bowel obstruction (HCC)    COVID-19    H/O hiatal hernia    Hyperlipidemia    Hypertension     Patient Active Problem List   Diagnosis Date Noted   Encounter for screening colonoscopy 07/29/2022   Hx of small bowel obstruction 03/23/2018   GERD (gastroesophageal reflux disease) 03/24/2015   Hypokalemia 06/21/2014   Hyperglycemia 06/20/2014   Hypertension    Hyperlipidemia    Bowel obstruction (HCC) 10/01/2013   Hepatic steatosis 02/12/2013   Transaminitis 02/12/2013    Past Surgical History:  Procedure Laterality Date   ABDOMINAL HYSTERECTOMY     APPENDECTOMY     BREAST CYST EXCISION Right    [phone]? benign   COLONOSCOPY  03/18/2012   Procedure: COLONOSCOPY;  Surgeon: Malissa Hippo, MD;  [facility];  Service: Endoscopy;  Laterality: N/A;  930   COLONOSCOPY WITH PROPOFOL N/A 07/29/2022   Procedure: COLONOSCOPY WITH PROPOFOL;   Surgeon: Dolores Frame, MD;  Location: [facility];  Service: Gastroenterology;  Laterality: N/A;  2:15 PM   EXPLORATORY LAPAROTOMY     LAPAROTOMY N/A 02/14/2013   Procedure: EXPLORATORY LAPAROTOMY;  Surgeon: Dalia Heading, MD;  Location: [facility];  Service: General;  Laterality: N/A;   MELANOMA EXCISION  03/2019   Dr.Hall, removed from nack    POLYPECTOMY  07/29/2022   Procedure: POLYPECTOMY;  Surgeon: Dolores Frame, MD;  Location: [facility];  Service: Gastroenterology;;   SKIN SURGERY  02/2019   Melanoma removed from upper back. Dr. Margo Aye, [place]     OB History   No obstetric history on file.      Home Medications    Prior to Admission medications   Medication Sig Start Date End Date Taking? Authorizing Provider  tiZANidine (ZANAFLEX) 4 MG capsule Take 1 capsule (4 mg total) by mouth 2 (two) times daily as needed for muscle spasms. 06/29/23  Yes Leath-Warren, Sadie Haber, NP  amLODipine (NORVASC) 5 MG tablet TAKE 1 TABLET DAILY 06/08/23   Sharon Seller, NP  Ascorbic Acid (VITAMIN C PO) Take 1 tablet by mouth daily.    [provider]  aspirin EC 81 MG tablet Take 81 mg by mouth daily.    [provider]  beta carotene w/minerals (OCUVITE) tablet Take 1 tablet by mouth in the morning  and at bedtime.    [provider]  Calcium Carbonate-Vitamin D (CALCIUM 600 + D PO) Take 1 tablet by mouth 2 (two) times daily.    [provider]  Cholecalciferol (VITAMIN D3) 250 MCG (10000 UT) capsule Take 10,000 Units by mouth daily.    [provider]  docusate sodium (COLACE) 100 MG capsule Take 100 mg by mouth 2 (two) times daily.    [provider]  Fish Oil-Cholecalciferol (FISH OIL + D3) 1000-1000 MG-UNIT CAPS Take 1 capsule by mouth 2 (two) times daily.    [provider]  Lactobacillus-Inulin (CULTURELLE DIGESTIVE HEALTH PO) Take 1 capsule by mouth daily.     [provider]  losartan (COZAAR)  50 MG tablet TAKE 1 TABLET DAILY 03/09/23   Sharon Seller, NP  Misc Natural Products (OSTEO BI-FLEX ADV DOUBLE ST PO) Take 1 tablet by mouth 2 (two) times daily.    [provider]  omeprazole (PRILOSEC) 40 MG capsule TAKE 1 CAPSULE DAILY 09/29/22   Sharon Seller, NP  polyethylene glycol powder (GLYCOLAX/MIRALAX) powder Take 17 g by mouth in the morning and at bedtime.    [provider]  vitamin B-12 (CYANOCOBALAMIN) 1000 MCG tablet Take 1 tablet (1,000 mcg total) by mouth daily. 08/24/19   Sharon Seller, NP  zinc gluconate 50 MG tablet Take 50 mg by mouth daily.    [provider]    Family History Family History  Problem Relation Age of Onset   Cancer - Other Mother        Kidney   Fibromyalgia Mother    Arthritis Mother    Cancer - Lung Father 28   Heart disease Brother 27       had heart transplant then died 3 years later after restarting smoking   Lung disease Paternal Aunt        Lung Mass   COPD Paternal Uncle    Leukemia Paternal Uncle    Stomach cancer Neg Hx    Esophageal cancer Neg Hx    Pancreatic cancer Neg Hx    Rectal cancer Neg Hx     Social History Social History   Tobacco Use   Smoking status: Never    Passive exposure: Never   Smokeless tobacco: Never  Vaping Use   Vaping status: Never Used  Substance Use Topics   Alcohol use: Yes    Comment: occassionally   Drug use: No     Allergies   Patient has no known allergies.   Review of Systems Review of Systems Per HPI  Physical Exam Triage Vital Signs ED Triage Vitals  Encounter Vitals Group     BP 06/29/23 0918 (!) 162/91     Systolic BP Percentile --      Diastolic BP Percentile --      Pulse Rate 06/29/23 0918 71     Resp 06/29/23 0918 18     Temp 06/29/23 0918 98 F (36.7 C)     Temp Source 06/29/23 0918 Oral     SpO2 06/29/23 0918 96 %     Weight --      Height --      Head Circumference --      Peak Flow --      Pain Score 06/29/23 0923 3      Pain Loc --      Pain Education --      Exclude from Growth Chart --    No data found.  Updated Vital Signs BP (!) 162/91 (BP Location: Right Arm)   Pulse 71   Temp 98 F (36.7 C) (Oral)   Resp 18   LMP 09/10/2014 (Exact Date)   SpO2 96%   Visual Acuity Right Eye Distance:   Left Eye Distance:   Bilateral Distance:    Right Eye Near:   Left Eye Near:    Bilateral Near:     Physical Exam Vitals and nursing note reviewed.  Constitutional:      General: She is not in acute distress.    Appearance: Normal appearance.  HENT:     Head: Normocephalic.  Eyes:     Extraocular Movements: Extraocular movements intact.     Pupils: Pupils are equal, round, and reactive to light.  Cardiovascular:     Rate and Rhythm: Normal rate and regular rhythm.     Pulses: Normal pulses.     Heart sounds: Normal heart sounds.  Pulmonary:     Effort: Pulmonary effort is normal. No respiratory distress.     Breath sounds: Normal breath sounds. No stridor. No wheezing, rhonchi or rales.  Musculoskeletal:     Cervical back: Normal range of motion. No swelling, edema, deformity, erythema or bony tenderness. Normal range of motion.     Thoracic back: No swelling, edema or deformity. Normal range of motion.       Back:     Comments: Tenderness present in the left thoracic paraspinal muscles.  There is no bruising, swelling, or deformity present.    Lymphadenopathy:     Cervical: No cervical adenopathy.  Skin:    General: Skin is warm and dry.  Neurological:     General: No focal deficit present.     Mental Status: She is alert and oriented to person, place, and time.  Psychiatric:        Mood and Affect: Mood normal.        Behavior: Behavior normal.      UC Treatments / Results  Labs (all labs ordered are listed, but only abnormal results are displayed) Labs Reviewed - No data to display  EKG   Radiology No results found.  Procedures Procedures (including critical care  time)  Medications Ordered in UC Medications  dexamethasone (DECADRON) injection 10 mg (10 mg Intramuscular Given 06/29/23 0950)    Initial Impression / Assessment and Plan / UC Course  I have reviewed the triage vital signs and the nursing notes.  Pertinent labs & imaging results that were available during my care of the patient were reviewed by me and considered in my medical decision making (see chart for details).  Suspect patient may be experiencing thoracic back strain which developed after mopping.  On exam, there is no bruising, swelling, or erythema present.  She does have tenderness noted in the left thoracic paraspinal regions.  Decadron 10 mg IM administered for inflammation.  Tizanidine 4 mg prescribed for pain and spasm.  Supportive care recommendations were provided and discussed with the patient to include continuing over-the-counter analgesics, the use of ice or heat, and stretching exercises.  Patient was given strict ER follow-up precautions, she was also advised to follow-up with her PCP if symptoms fail to improve.  Patient was in agreement with this plan of care and verbalizes understanding.  All questions were answered.  Patient stable for discharge.  Final Clinical Impressions(s) / UC Diagnoses   Final diagnoses:  Strain of thoracic back region     Discharge Instructions  You have been given an injection of Decadron 10 mg.  Do not take any additional NSAIDs such as Advil, ibuprofen, Aleve, or naproxen.  Recommend that you take over-the-counter Tylenol for breakthrough pain or discomfort. Take medication as prescribed. Take over-the-counter Tylenol as needed for pain or discomfort. Recommend the use of ice or heat.  Apply ice for pain and swelling, heat for spasm or stiffness.  Apply for 20 minutes, remove for 1 hour, repeat as needed. Try to stay as active as possible.  I provided some stretching exercises for you to do at least 2-3 times daily while symptoms  persist. Go to the emergency department immediately if you experience severe pain, numbness or tingling in your upper extremities, or difficulty breathing. Follow-up with your primary care physician if symptoms fail to improve over the next 1 to 2 weeks. Follow-up as needed.     ED Prescriptions     Medication Sig Dispense Auth. Provider   tiZANidine (ZANAFLEX) 4 MG capsule Take 1 capsule (4 mg total) by mouth 2 (two) times daily as needed for muscle spasms. 20 capsule Leath-Warren, Sadie Haber, NP      PDMP not reviewed this encounter.   Abran Cantor, NP 06/29/23 [phone]

## 2023-06-29 NOTE — Discharge Instructions (Signed)
You have been given an injection of Decadron 10 mg.  Do not take any additional NSAIDs such as Advil, ibuprofen, Aleve, or naproxen.  Recommend that you take over-the-counter Tylenol for breakthrough pain or discomfort. Take medication as prescribed. Take over-the-counter Tylenol as needed for pain or discomfort. Recommend the use of ice or heat.  Apply ice for pain and swelling, heat for spasm or stiffness.  Apply for 20 minutes, remove for 1 hour, repeat as needed. Try to stay as active as possible.  I provided some stretching exercises for you to do at least 2-3 times daily while symptoms persist. Go to the emergency department immediately if you experience severe pain, numbness or tingling in your upper extremities, or difficulty breathing. Follow-up with your primary care physician if symptoms fail to improve over the next 1 to 2 weeks. Follow-up as needed.

## 2023-06-29 NOTE — ED Triage Notes (Signed)
Pt reports having muscle spasms x 3 days tightness in the left upper side of the back. Has tried OTC medications heating pads and hot showers to help with sx's but it only alleviates momentarily.

## 2023-08-03 DIAGNOSIS — L304 Erythema intertrigo: Secondary | ICD-10-CM | POA: Diagnosis not present

## 2023-08-03 DIAGNOSIS — L720 Epidermal cyst: Secondary | ICD-10-CM | POA: Diagnosis not present

## 2023-08-24 ENCOUNTER — Ambulatory Visit (INDEPENDENT_AMBULATORY_CARE_PROVIDER_SITE_OTHER): Payer: Medicare Other | Admitting: Nurse Practitioner

## 2023-08-24 ENCOUNTER — Encounter: Payer: Self-pay | Admitting: Nurse Practitioner

## 2023-08-24 VITALS — BP 130/86 | HR 65 | Temp 98.0°F | Ht 64.0 in | Wt 188.6 lb

## 2023-08-24 DIAGNOSIS — M150 Primary generalized (osteo)arthritis: Secondary | ICD-10-CM | POA: Diagnosis not present

## 2023-08-24 DIAGNOSIS — I10 Essential (primary) hypertension: Secondary | ICD-10-CM

## 2023-08-24 DIAGNOSIS — E785 Hyperlipidemia, unspecified: Secondary | ICD-10-CM | POA: Diagnosis not present

## 2023-08-24 DIAGNOSIS — K760 Fatty (change of) liver, not elsewhere classified: Secondary | ICD-10-CM

## 2023-08-24 DIAGNOSIS — K219 Gastro-esophageal reflux disease without esophagitis: Secondary | ICD-10-CM

## 2023-08-24 NOTE — Progress Notes (Signed)
 Careteam: Patient Care Team: Sharon Seller, NP as PCP - General (Geriatric Medicine) Sallye Lat, MD as Consulting Physician (Ophthalmology)  PLACE OF SERVICE:  [facility]  Advanced Directive information Does Patient Have a Medical Advance Directive?: Yes, Type of Advance Directive: Healthcare Power of [place];Out of facility DNR (pink MOST or yellow form);Living will, Does patient want to make changes to medical advance directive?: No - Patient declined  No Known Allergies  Chief Complaint  Patient presents with   Medical Management of Chronic Issues    Medical Management of Chronic Issues. 6 Month follow up    Discussed the use of AI scribe software for clinical note transcription with the patient, who gave verbal consent to proceed.  History of Present Illness   [name] [name] is a 78 year old female who presents for a six-month follow-up visit.  She has experienced recent weight gain, which she attributes to stress from managing the estate of a mentally challenged individual for whom she was a power of attorney. The individual passed away after contracting COVID-19 and suffering a massive stroke. With the estate matters now resolved, she is returning to her routine.  Her hypertension is well controlled with Norvasc 5 mg daily and Losartan 50 mg daily.   No issues with indigestion or acid reflux are reported, and she continues to take Omeprazole 40 mg daily.   There is no worsening of arthritis symptoms  A recent bone density scan was normal, but she continues to take calcium and vitamin D supplements for bone health.   She is not currently on any cholesterol medication, and it has been about a year since her last evaluation due to elevated liver enzymes.    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 No issues with bowel movements are reported.      Review of Systems:  Review of Systems   Constitutional:  Negative for chills, fever and weight loss.  HENT:  Negative for tinnitus.   Respiratory:  Negative for cough, sputum production and shortness of breath.   Cardiovascular:  Negative for chest pain, palpitations and leg swelling.  Gastrointestinal:  Negative for abdominal pain, constipation, diarrhea and heartburn.  Genitourinary:  Negative for dysuria, frequency and urgency.  Musculoskeletal:  Negative for back pain, falls, joint pain and myalgias.  Skin: Negative.   Neurological:  Negative for dizziness and headaches.  Psychiatric/Behavioral:  Negative for depression and memory loss. The patient does not have insomnia.     Past Medical History:  Diagnosis Date   Acid reflux    Bowel obstruction (HCC)    COVID-19    H/O hiatal hernia    Hyperlipidemia    Hypertension    Past Surgical History:  Procedure Laterality Date   ABDOMINAL HYSTERECTOMY     APPENDECTOMY     BREAST CYST EXCISION Right    [phone]? benign   COLONOSCOPY  03/18/2012   Procedure: COLONOSCOPY;  Surgeon: Malissa Hippo, MD;  [facility];  Service: Endoscopy;  Laterality: N/A;  930   COLONOSCOPY WITH PROPOFOL N/A 07/29/2022   Procedure: COLONOSCOPY WITH PROPOFOL;  Surgeon: Dolores Frame, MD;  [facility]: [facility];  Service: Gastroenterology;  Laterality: N/A;  2:15 PM   EXPLORATORY LAPAROTOMY     LAPAROTOMY N/A 02/14/2013   Procedure: EXPLORATORY LAPAROTOMY;  Surgeon: Dalia Heading, MD;  Location: [facility];  Service: General;  Laterality: N/A;   MELANOMA EXCISION  03/2019  Dr.Hall, removed from nack    POLYPECTOMY  07/29/2022   Procedure: POLYPECTOMY;  Surgeon: Dolores Frame, MD;  [facility]: [facility];  Service: Gastroenterology;;   SKIN SURGERY  02/2019   Melanoma removed from upper back. Dr. Margo Aye, [place]    Social History:   reports that she has never smoked. She has never been exposed to tobacco smoke. She has never used smokeless tobacco. She  reports current alcohol use. She reports that she does not use drugs.  Family History  Problem Relation Age of Onset   Cancer - Other Mother        Kidney   Fibromyalgia Mother    Arthritis Mother    Cancer - Lung Father 36   Heart disease Brother 52       had heart transplant then died 3 years later after restarting smoking   Lung disease Paternal Aunt        Lung Mass   COPD Paternal Uncle    Leukemia Paternal Uncle    Stomach cancer Neg Hx    Esophageal cancer Neg Hx    Pancreatic cancer Neg Hx    Rectal cancer Neg Hx     Medications: Patient's Medications  New Prescriptions   No medications on file  Previous Medications   AMLODIPINE (NORVASC) 5 MG TABLET    TAKE 1 TABLET DAILY   ASCORBIC ACID (VITAMIN C PO)    Take 1 tablet by mouth daily.   ASPIRIN EC 81 MG TABLET    Take 81 mg by mouth daily.   BETA CAROTENE W/MINERALS (OCUVITE) TABLET    Take 1 tablet by mouth in the morning and at bedtime.   CALCIUM CARBONATE-VITAMIN D (CALCIUM 600 + D PO)    Take 1 tablet by mouth 2 (two) times daily.   CHOLECALCIFEROL (VITAMIN D3) 250 MCG (10000 UT) CAPSULE    Take 10,000 Units by mouth daily.   DOCUSATE SODIUM (COLACE) 100 MG CAPSULE    Take 100 mg by mouth 2 (two) times daily.   FISH OIL-CHOLECALCIFEROL (FISH OIL + D3) 1000-1000 MG-UNIT CAPS    Take 1 capsule by mouth 2 (two) times daily.   LACTOBACILLUS-INULIN (CULTURELLE DIGESTIVE HEALTH PO)    Take 1 capsule by mouth daily.    LOSARTAN (COZAAR) 50 MG TABLET    TAKE 1 TABLET DAILY   MISC NATURAL PRODUCTS (OSTEO BI-FLEX ADV DOUBLE ST PO)    Take 1 tablet by mouth 2 (two) times daily.   OMEPRAZOLE (PRILOSEC) 40 MG CAPSULE    TAKE 1 CAPSULE DAILY   POLYETHYLENE GLYCOL POWDER (GLYCOLAX/MIRALAX) POWDER    Take 17 g by mouth in the morning and at bedtime.   TIZANIDINE (ZANAFLEX) 4 MG CAPSULE    Take 1 capsule (4 mg total) by mouth 2 (two) times daily as needed for muscle spasms.   VITAMIN B-12 (CYANOCOBALAMIN) 1000 MCG TABLET    Take 1  tablet (1,000 mcg total) by mouth daily.   ZINC GLUCONATE 50 MG TABLET    Take 50 mg by mouth daily.  Modified Medications   No medications on file  Discontinued Medications   No medications on file    Physical Exam:  Vitals:   08/24/23 1408  BP: 130/86  Pulse: 65  Temp: 98 F (36.7 C)  SpO2: 100%  Weight: 188 lb 9.6 oz (85.5 kg)  Height: 5\' 4"  (1.626 m)   Body mass index is 32.37 kg/m. Wt Readings from Last 3 Encounters:  08/24/23 188  lb 9.6 oz (85.5 kg)  03/23/23 184 lb (83.5 kg)  02/23/23 187 lb 3.2 oz (84.9 kg)    Physical Exam Constitutional:      General: She is not in acute distress.    Appearance: She is well-developed. She is not diaphoretic.  HENT:     Head: Normocephalic and atraumatic.     Mouth/Throat:     Pharynx: No oropharyngeal exudate.  Eyes:     Conjunctiva/sclera: Conjunctivae normal.     Pupils: Pupils are equal, round, and reactive to light.  Cardiovascular:     Rate and Rhythm: Normal rate and regular rhythm.     Heart sounds: Normal heart sounds.  Pulmonary:     Effort: Pulmonary effort is normal.     Breath sounds: Normal breath sounds.  Abdominal:     General: Bowel sounds are normal.     Palpations: Abdomen is soft.  Musculoskeletal:     Cervical back: Normal range of motion and neck supple.     Right lower leg: No edema.     Left lower leg: No edema.  Skin:    General: Skin is warm and dry.  Neurological:     Mental Status: She is alert.  Psychiatric:        Mood and Affect: Mood normal.     Labs reviewed: Basic Metabolic Panel: Recent Labs    08/26/22 0838 02/23/23 1331  NA 142 143  K 4.4 4.5  CL 107 105  CO2 28 29  GLUCOSE 91 82  BUN 17 14  CREATININE 0.69 0.53*  CALCIUM 9.4 9.3   Liver Function Tests: Recent Labs    08/26/22 0838 02/23/23 1331  AST 54* 65*  ALT 76* 70*  BILITOT 0.6 0.7  PROT 6.7 6.7   No results for input(s): "LIPASE", "AMYLASE" in the last 8760 hours. No results for input(s):  "AMMONIA" in the last 8760 hours. CBC: Recent Labs    08/26/22 0838 02/23/23 1331  WBC 7.2 9.8  NEUTROABS 3,521 5,419  HGB 15.0 14.3  HCT 43.9 42.8  MCV 93.0 94.9  PLT 250 281   Lipid Panel: Recent Labs    08/26/22 0838 02/23/23 1331  CHOL 161 158  HDL 42* 38*  LDLCALC 93 84  TRIG 162* 272*  CHOLHDL 3.8 4.2   TSH: No results for input(s): "TSH" in the last 8760 hours. A1C: Lab Results  Component Value Date   HGBA1C 5.6 03/21/2019     Assessment/Plan Assessment and Plan    Hypertension Hypertension well controlled with current medication regimen. - Continue Norvasc 5 mg daily. - Continue Losartan 50 mg daily.  Gastroesophageal reflux disease (GERD) GERD asymptomatic on current medication. - Continue Omeprazole 40 mg daily.  Hyperlipidemia, unspecified hyperlipidemia type - continue dietary modifications, follow up labs today -     COMPLETE METABOLIC PANEL WITH GFR  Lipid panel  Hepatic steatosis  Continue low fat diet.   Primary osteoarthritis involving multiple joints  Stable. Can use tylenol 1000 mg by mouth every 12 hours as needed   Return in about 6 months (around 02/24/2024) for routine follow up.  Janene Harvey. Biagio Borg [facility] [phone]

## 2023-08-25 ENCOUNTER — Telehealth: Payer: Self-pay

## 2023-08-25 ENCOUNTER — Encounter: Payer: Self-pay | Admitting: Nurse Practitioner

## 2023-08-25 ENCOUNTER — Other Ambulatory Visit: Payer: Self-pay | Admitting: Nurse Practitioner

## 2023-08-25 DIAGNOSIS — K760 Fatty (change of) liver, not elsewhere classified: Secondary | ICD-10-CM

## 2023-08-25 LAB — COMPLETE METABOLIC PANEL WITH GFR
AG Ratio: 1.6 (calc) (ref 1.0–2.5)
ALT: 95 U/L — ABNORMAL HIGH (ref 6–29)
AST: 78 U/L — ABNORMAL HIGH (ref 10–35)
Albumin: 4.3 g/dL (ref 3.6–5.1)
Alkaline phosphatase (APISO): 86 U/L (ref 37–153)
BUN: 12 mg/dL (ref 7–25)
CO2: 28 mmol/L (ref 20–32)
Calcium: 9.9 mg/dL (ref 8.6–10.4)
Chloride: 103 mmol/L (ref 98–110)
Creat: 0.6 mg/dL (ref 0.60–1.00)
Globulin: 2.7 g/dL (ref 1.9–3.7)
Glucose, Bld: 80 mg/dL (ref 65–99)
Potassium: 4.7 mmol/L (ref 3.5–5.3)
Sodium: 140 mmol/L (ref 135–146)
Total Bilirubin: 0.9 mg/dL (ref 0.2–1.2)
Total Protein: 7 g/dL (ref 6.1–8.1)

## 2023-08-25 LAB — CBC WITH DIFFERENTIAL/PLATELET
Absolute Lymphocytes: 3262 {cells}/uL (ref 850–3900)
Absolute Monocytes: 874 {cells}/uL (ref 200–950)
Basophils Absolute: 113 {cells}/uL (ref 0–200)
Basophils Relative: 1.2 %
Eosinophils Absolute: 197 {cells}/uL (ref 15–500)
Eosinophils Relative: 2.1 %
HCT: 46.4 % — ABNORMAL HIGH (ref 35.0–45.0)
Hemoglobin: 15.6 g/dL — ABNORMAL HIGH (ref 11.7–15.5)
MCH: 31.9 pg (ref 27.0–33.0)
MCHC: 33.6 g/dL (ref 32.0–36.0)
MCV: 94.9 fL (ref 80.0–100.0)
MPV: 12.8 fL — ABNORMAL HIGH (ref 7.5–12.5)
Monocytes Relative: 9.3 %
Neutro Abs: 4954 {cells}/uL (ref 1500–7800)
Neutrophils Relative %: 52.7 %
Platelets: 297 10*3/uL (ref 140–400)
RBC: 4.89 10*6/uL (ref 3.80–5.10)
RDW: 12.3 % (ref 11.0–15.0)
Total Lymphocyte: 34.7 %
WBC: 9.4 10*3/uL (ref 3.8–10.8)

## 2023-08-25 LAB — LIPID PANEL
Cholesterol: 179 mg/dL (ref <200)
HDL: 43 mg/dL — ABNORMAL LOW (ref >=50)
LDL Cholesterol (Calc): 96 mg/dL
Non-HDL Cholesterol (Calc): 136 mg/dL — ABNORMAL HIGH (ref <130)
Total CHOL/HDL Ratio: 4.2 (calc) (ref <5.0)
Triglycerides: 283 mg/dL — ABNORMAL HIGH (ref <150)

## 2023-08-25 NOTE — Telephone Encounter (Signed)
 Copied from CRM [id number]. Topic: Clinical - Lab/Test Results >> Aug 25, 2023 11:04 AM Irine Seal wrote: Reason for CRM: Patient called to get lab results. Read provider note verbatim. Patient verbalized understanding and has no additional questions or concerns at this time.   See Result notes. FYI

## 2023-09-07 ENCOUNTER — Other Ambulatory Visit: Payer: Self-pay | Admitting: Nurse Practitioner

## 2023-09-07 DIAGNOSIS — Z1231 Encounter for screening mammogram for malignant neoplasm of breast: Secondary | ICD-10-CM

## 2023-09-08 ENCOUNTER — Encounter: Payer: Self-pay | Admitting: Nurse Practitioner

## 2023-09-09 ENCOUNTER — Encounter (INDEPENDENT_AMBULATORY_CARE_PROVIDER_SITE_OTHER): Payer: Self-pay | Admitting: *Deleted

## 2023-09-21 ENCOUNTER — Encounter (INDEPENDENT_AMBULATORY_CARE_PROVIDER_SITE_OTHER): Payer: Self-pay | Admitting: Gastroenterology

## 2023-09-21 ENCOUNTER — Ambulatory Visit (INDEPENDENT_AMBULATORY_CARE_PROVIDER_SITE_OTHER): Admitting: Gastroenterology

## 2023-09-21 VITALS — BP 152/102 | HR 78 | Temp 97.7°F | Ht 64.0 in | Wt 188.6 lb

## 2023-09-21 DIAGNOSIS — R7989 Other specified abnormal findings of blood chemistry: Secondary | ICD-10-CM

## 2023-09-21 DIAGNOSIS — K760 Fatty (change of) liver, not elsewhere classified: Secondary | ICD-10-CM

## 2023-09-21 DIAGNOSIS — R7401 Elevation of levels of liver transaminase levels: Secondary | ICD-10-CM | POA: Diagnosis not present

## 2023-09-21 NOTE — Patient Instructions (Signed)
 We will check a few labs and get a special [place]  of the liver for further evaluation If these evaluations are negative, we may consider stopping fish oil for a few months and rechecking liver enzymes thereafter Continue with healthy lifestyle with good weight management utilizing mediterranean diet and getting plenty of physical activity  Follow up 6 months  It was a pleasure to see you today. I want to create trusting relationships with patients and provide genuine, compassionate, and quality care. I truly value your feedback! please be on the lookout for a survey regarding your visit with me today. I appreciate your input about our visit and your time in completing this!    Nima Kemppainen L. Kayleann Mccaffery, MSN, APRN, AGNP-C Adult-Gerontology Nurse Practitioner [facility]

## 2023-09-21 NOTE — Progress Notes (Addendum)
 Referring Provider: Verma Gobble, NP Primary Care Physician:  Verma Gobble, NP Primary GI Physician: Dr. Sammi Crick   Chief Complaint  Patient presents with   hepatic steatosis    Follow up on hepatic steatosis.    HPI:   [name] [name] is a 78 y.o. female with past medical history of GERD, HLD, HTN  Patient presenting today for:  Follow up of elevated LFTs and hepatic steatosis   Last seen in office in December 2023 for elevated LFTs/hepatic steatosis, at that time reported elevated LFTs for the past few years. PCP had stopped on of her meds to see if this helped. Previously drinking 1 glass of wine per month but had stopped etoh use. Denied herbal supplements. Some occasional LUQ pain that was self limiting.  Recommended acute hep panel, iron studies, serologies, mediterranean diet, routine exercise, consider stopping fish oil, continue etoh avoidance  Labs done December 2023 with normal serologies, negative acute hep panel, mildly elevated ferritin with negative hemochromatosis testing   Present: She is not drinking alcohol. Still on fish oil which she has been on for many years. Denies any new medications recently. Takes tylenol  only on occasion. No swelling to abdomen or legs, no jaundice, pruritus or confusion.   Most recent labs in march AST 78, ALT 95, plt count 297, T bili WNL  No red flag symptoms. Patient denies melena, hematochezia, nausea, vomiting, diarrhea, constipation, dysphagia, odyonophagia, early satiety or weight loss.    Last Colonoscopy:07/2022- A single non-bleeding colonic angioectasia.                           - Four 2 to 6 mm polyps in the transverse colon and                            in the cecum, removed with a cold snare. Resected                            and retrieved.                           - One 1 mm polyp in the ascending colon, removed                            with a cold biopsy forceps. Resected and retrieved.                            - Diverticulosis in the sigmoid colon and in the                            descending colon.                           - Non-bleeding internal hemorrhoids. (Biopsy: Tubular adenomas x5)  Repeat TCS 3 years   Past Medical History:  Diagnosis Date   Acid reflux    Bowel obstruction (HCC)    COVID-19    H/O hiatal hernia    Hyperlipidemia    Hypertension     Past Surgical History:  Procedure Laterality Date   ABDOMINAL HYSTERECTOMY     APPENDECTOMY  BREAST CYST EXCISION Right    [phone]? benign   COLONOSCOPY  03/18/2012   Procedure: COLONOSCOPY;  Surgeon: Ruby Corporal, MD;  [facility];  Service: Endoscopy;  Laterality: N/A;  930   COLONOSCOPY WITH PROPOFOL  N/A 07/29/2022   Procedure: COLONOSCOPY WITH PROPOFOL ;  Surgeon: Urban Garden, MD;  [facility];  Service: Gastroenterology;  Laterality: N/A;  2:15 PM   EXPLORATORY LAPAROTOMY     LAPAROTOMY N/A 02/14/2013   Procedure: EXPLORATORY LAPAROTOMY;  Surgeon: Beau Bound, MD;  Location: [facility];  Service: General;  Laterality: N/A;   MELANOMA EXCISION  03/2019   Dr.Hall, removed from nack    POLYPECTOMY  07/29/2022   Procedure: POLYPECTOMY;  Surgeon: Urban Garden, MD;  [facility];  Service: Gastroenterology;;   SKIN SURGERY  02/2019   Melanoma removed from upper back. Dr. Del Favia, [place]     Current Outpatient Medications  Medication Sig Dispense Refill   amLODipine  (NORVASC ) 5 MG tablet TAKE 1 TABLET DAILY 90 tablet 3   Ascorbic Acid (VITAMIN C PO) Take 1 tablet by mouth daily.     aspirin  EC 81 MG tablet Take 81 mg by mouth daily.     beta carotene w/minerals (OCUVITE) tablet Take 1 tablet by mouth in the morning and at bedtime.     Calcium  Carbonate-Vitamin D (CALCIUM  600 + D PO) Take 1 tablet by mouth 2 (two) times daily.     Cholecalciferol (VITAMIN D3) 250 MCG (10000 UT) capsule Take 10,000 Units by mouth daily.     docusate sodium   (COLACE) 100 MG capsule Take 100 mg by mouth 2 (two) times daily.     Fish Oil-Cholecalciferol (FISH OIL + D3) 1000-1000 MG-UNIT CAPS Take 1 capsule by mouth 2 (two) times daily.     Lactobacillus-Inulin (CULTURELLE DIGESTIVE HEALTH PO) Take 1 capsule by mouth daily.      losartan  (COZAAR ) 50 MG tablet TAKE 1 TABLET DAILY 90 tablet 3   Misc Natural Products (OSTEO BI-FLEX ADV DOUBLE ST PO) Take 1 tablet by mouth 2 (two) times daily.     omeprazole  (PRILOSEC) 40 MG capsule TAKE 1 CAPSULE DAILY 90 capsule 3   polyethylene glycol powder (GLYCOLAX /MIRALAX ) powder Take 17 g by mouth in the morning and at bedtime.     tiZANidine  (ZANAFLEX ) 4 MG capsule Take 1 capsule (4 mg total) by mouth 2 (two) times daily as needed for muscle spasms. 20 capsule 0   vitamin B-12 (CYANOCOBALAMIN) 1000 MCG tablet Take 1 tablet (1,000 mcg total) by mouth daily. 30 tablet 11   zinc gluconate 50 MG tablet Take 50 mg by mouth daily.     No current facility-administered medications for this visit.    Allergies as of 09/21/2023   (No Known Allergies)    Social History   Socioeconomic History   Marital status: Single    Spouse name: Not on file   Number of children: Not on file   Years of education: Not on file   Highest education level: Not on file  Occupational History   Not on file  Tobacco Use   Smoking status: Never    Passive exposure: Never   Smokeless tobacco: Never  Vaping Use   Vaping status: Never Used  Substance and Sexual Activity   Alcohol use: Yes    Comment: occassionally   Drug use: No   Sexual activity: Not Currently  Other Topics Concern   Not on file  Social History  Narrative   Social History      Diet?       Do you drink/eat things with caffeine? yes      Marital status?            single                        What year were you married?      Do you live in a house, apartment, assisted living, condo, trailer, etc.? house      Is it one or more stories? Yes but seldom go  upstairs      How many persons live in your home? 1       Do you have any pets in your home? (please list) no       Highest level of education completed? 12- business college      Current or past profession: retired      Do you exercise?                  yes                    Type & how often? 5 days a week- 1 hour per day      Advanced Directives      Do you have a living will? yes      Do you have a DNR form?                                  If not, do you want to discuss one? yes      Do you have signed POA/HPOA for forms?  yes      Functional Status      Do you have difficulty bathing or dressing yourself? no      Do you have difficulty preparing food or eating? no      Do you have difficulty managing your medications? no      Do you have difficulty managing your finances?  no      Do you have difficulty affording your medications?  no      Social Drivers of Corporate investment banker Strain: Not on file  Food Insecurity: Not on file  Transportation Needs: Not on file  Physical Activity: Not on file  Stress: Not on file  Social Connections: Not on file    Review of systems General: negative for malaise, night sweats, fever, chills, weight loss Neck: Negative for lumps, goiter, pain and significant neck swelling Resp: Negative for cough, wheezing, dyspnea at rest CV: Negative for chest pain, leg swelling, palpitations, orthopnea GI: denies melena, hematochezia, nausea, vomiting, diarrhea, constipation, dysphagia, odyonophagia, early satiety or unintentional weight loss.  Derm: Negative for itching, rash or jaundice  The remainder of the review of systems is noncontributory.  Physical Exam: BP (!) 149/97   Pulse 78   Temp 97.7 F (36.5 C) (Oral)   Ht 5\' 4"  (1.626 m)   Wt 188 lb 9.6 oz (85.5 kg)   LMP 09/10/2014 (Exact Date)   BMI 32.37 kg/m  General:   Alert and oriented. No distress noted. Pleasant and cooperative.  Head:  Normocephalic and  atraumatic. Eyes:  Conjuctiva clear without scleral icterus. Mouth:  Oral mucosa pink and moist. Good dentition. No lesions. Heart: Normal rate and rhythm, s1 and s2 heart sounds present.  Lungs: Clear lung  sounds in all lobes. Respirations equal and unlabored. Abdomen:  +BS, soft, non-tender and non-distended. No rebound or guarding. No HSM or masses noted. Derm: No palmar erythema or jaundice Msk:  Symmetrical without gross deformities. Normal posture. Extremities:  Without edema. Neurologic:  Alert and  oriented x4 Psych:  Alert and cooperative. Normal mood and affect.  Invalid input(s): "6 MONTHS"   ASSESSMENT: [name] [name] is a 78 y.o. female presenting today for elevated LFTs and hepatic steatosis  History of elevated LFTs since atleast 2016 with US  in 2023 showing hepatic steatosis, labs done and end of 2023 with negative AI serologies, negative viral hep panel, mildly elevated ferritin but negative hemochromatosis testing. Transaminases have continued to be mildly elevated. She has not started any new meds, denies ETOH, rare tylenol  use. At this time would recommend proceeding with US  elastography and fibrosure testing to evaluate for liver fibrosis, will also recheck ferritin given previous elevation. May consider hematology referral/Liver MRI to evaluate for iron deposits pending the above evaluations. She notably has been on fish oil for many years, cannot rule out if this could be contributing some to transaminase elevation, though I think this is less likely the main contributor here.    PLAN:  -US  liver elastography -fibrosure test -mediterranean diet -repeat ferritin -routine exercise/good weight management  -avoid ETOH -consider hematology referral/MRI of liver if ferritin still elevated  All questions were answered, patient verbalized understanding and is in agreement with plan as outlined above.   Follow Up: 6 months   Kiri Hinderliter L. Adrien Alberta, MSN, APRN,  AGNP-C Adult-Gerontology Nurse Practitioner [facility]  I have reviewed the note and agree with the APP's assessment as described in this progress note  Samantha Cress, MD Gastroenterology and Hepatology [facility] Gastroenterology

## 2023-09-22 ENCOUNTER — Encounter (INDEPENDENT_AMBULATORY_CARE_PROVIDER_SITE_OTHER): Payer: Self-pay

## 2023-09-23 ENCOUNTER — Encounter (INDEPENDENT_AMBULATORY_CARE_PROVIDER_SITE_OTHER): Payer: Self-pay

## 2023-09-24 ENCOUNTER — Other Ambulatory Visit: Payer: Self-pay | Admitting: Nurse Practitioner

## 2023-09-24 LAB — LIVER FIBROSIS, FIBROTEST-ACTITEST
ALT: 78 U/L — ABNORMAL HIGH (ref 6–29)
Alpha-2-Macroglobulin: 361 mg/dL — ABNORMAL HIGH (ref 106–279)
Apolipoprotein A1: 157 mg/dL (ref 101–198)
Bilirubin: 0.7 mg/dL (ref 0.2–1.2)
Fibrosis Score: 0.56
GGT: 13 U/L (ref 3–65)
Haptoglobin: 100 mg/dL (ref 43–212)
Necroinflammat ACT Score: 0.56
Reference ID: 5452413

## 2023-09-24 LAB — FERRITIN: Ferritin: 52 ng/mL (ref 16–288)

## 2023-09-29 ENCOUNTER — Ambulatory Visit
Admission: RE | Admit: 2023-09-29 | Discharge: 2023-09-29 | Disposition: A | Discharge status: Home / Self Care | Source: Ambulatory Visit | Attending: Nurse Practitioner | Admitting: Nurse Practitioner

## 2023-09-29 DIAGNOSIS — Z1231 Encounter for screening mammogram for malignant neoplasm of breast: Secondary | ICD-10-CM | POA: Diagnosis not present

## 2023-10-01 ENCOUNTER — Ambulatory Visit (HOSPITAL_COMMUNITY)
Admission: RE | Admit: 2023-10-01 | Discharge: 2023-10-01 | Disposition: A | Discharge status: Home / Self Care | Source: Ambulatory Visit | Attending: Gastroenterology | Admitting: Gastroenterology

## 2023-10-01 ENCOUNTER — Encounter (INDEPENDENT_AMBULATORY_CARE_PROVIDER_SITE_OTHER): Payer: Self-pay

## 2023-10-01 DIAGNOSIS — K760 Fatty (change of) liver, not elsewhere classified: Secondary | ICD-10-CM | POA: Insufficient documentation

## 2023-10-01 DIAGNOSIS — R7401 Elevation of levels of liver transaminase levels: Secondary | ICD-10-CM | POA: Diagnosis not present

## 2023-10-01 DIAGNOSIS — K759 Inflammatory liver disease, unspecified: Secondary | ICD-10-CM | POA: Diagnosis not present

## 2023-10-01 DIAGNOSIS — K709 Alcoholic liver disease, unspecified: Secondary | ICD-10-CM | POA: Diagnosis not present

## 2023-10-01 DIAGNOSIS — R932 Abnormal findings on diagnostic imaging of liver and biliary tract: Secondary | ICD-10-CM | POA: Diagnosis not present

## 2023-10-01 DIAGNOSIS — Z944 Liver transplant status: Secondary | ICD-10-CM | POA: Diagnosis not present

## 2023-11-05 ENCOUNTER — Other Ambulatory Visit (INDEPENDENT_AMBULATORY_CARE_PROVIDER_SITE_OTHER): Payer: Self-pay

## 2023-11-05 DIAGNOSIS — R7989 Other specified abnormal findings of blood chemistry: Secondary | ICD-10-CM

## 2023-11-05 DIAGNOSIS — R7401 Elevation of levels of liver transaminase levels: Secondary | ICD-10-CM

## 2023-11-05 DIAGNOSIS — K760 Fatty (change of) liver, not elsewhere classified: Secondary | ICD-10-CM

## 2023-12-10 DIAGNOSIS — Z85820 Personal history of malignant melanoma of skin: Secondary | ICD-10-CM | POA: Diagnosis not present

## 2023-12-10 DIAGNOSIS — Z1283 Encounter for screening for malignant neoplasm of skin: Secondary | ICD-10-CM | POA: Diagnosis not present

## 2023-12-10 DIAGNOSIS — Z08 Encounter for follow-up examination after completed treatment for malignant neoplasm: Secondary | ICD-10-CM | POA: Diagnosis not present

## 2023-12-10 DIAGNOSIS — D225 Melanocytic nevi of trunk: Secondary | ICD-10-CM | POA: Diagnosis not present

## 2023-12-10 DIAGNOSIS — B078 Other viral warts: Secondary | ICD-10-CM | POA: Diagnosis not present

## 2024-01-07 DIAGNOSIS — R7989 Other specified abnormal findings of blood chemistry: Secondary | ICD-10-CM | POA: Diagnosis not present

## 2024-01-07 DIAGNOSIS — K760 Fatty (change of) liver, not elsewhere classified: Secondary | ICD-10-CM | POA: Diagnosis not present

## 2024-01-07 DIAGNOSIS — R7401 Elevation of levels of liver transaminase levels: Secondary | ICD-10-CM | POA: Diagnosis not present

## 2024-01-07 LAB — COMPREHENSIVE METABOLIC PANEL WITH GFR
AG Ratio: 1.5 (calc) (ref 1.0–2.5)
ALT: 47 U/L — ABNORMAL HIGH (ref 6–29)
AST: 39 U/L — ABNORMAL HIGH (ref 10–35)
Albumin: 3.9 g/dL (ref 3.6–5.1)
Alkaline phosphatase (APISO): 77 U/L (ref 37–153)
BUN: 10 mg/dL (ref 7–25)
CO2: 26 mmol/L (ref 20–32)
Calcium: 9.3 mg/dL (ref 8.6–10.4)
Chloride: 106 mmol/L (ref 98–110)
Creat: 0.72 mg/dL (ref 0.60–1.00)
Globulin: 2.6 g/dL (ref 1.9–3.7)
Glucose, Bld: 96 mg/dL (ref 65–99)
Potassium: 4.3 mmol/L (ref 3.5–5.3)
Sodium: 140 mmol/L (ref 135–146)
Total Bilirubin: 0.6 mg/dL (ref 0.2–1.2)
Total Protein: 6.5 g/dL (ref 6.1–8.1)
eGFR: 86 mL/min/1.73m2 (ref >=60)

## 2024-01-11 ENCOUNTER — Ambulatory Visit (INDEPENDENT_AMBULATORY_CARE_PROVIDER_SITE_OTHER): Payer: Self-pay | Admitting: Gastroenterology

## 2024-02-03 ENCOUNTER — Encounter (INDEPENDENT_AMBULATORY_CARE_PROVIDER_SITE_OTHER): Payer: Self-pay | Admitting: Gastroenterology

## 2024-02-26 ENCOUNTER — Ambulatory Visit: Admitting: Nurse Practitioner

## 2024-03-03 ENCOUNTER — Ambulatory Visit (INDEPENDENT_AMBULATORY_CARE_PROVIDER_SITE_OTHER): Admitting: Gastroenterology

## 2024-03-03 ENCOUNTER — Other Ambulatory Visit: Payer: Self-pay | Admitting: Nurse Practitioner

## 2024-03-03 ENCOUNTER — Telehealth (INDEPENDENT_AMBULATORY_CARE_PROVIDER_SITE_OTHER): Payer: Self-pay

## 2024-03-03 ENCOUNTER — Encounter (INDEPENDENT_AMBULATORY_CARE_PROVIDER_SITE_OTHER): Payer: Self-pay | Admitting: Gastroenterology

## 2024-03-03 VITALS — BP 137/91 | HR 103 | Temp 97.3°F | Ht 64.0 in | Wt 188.5 lb

## 2024-03-03 DIAGNOSIS — K7581 Nonalcoholic steatohepatitis (NASH): Secondary | ICD-10-CM

## 2024-03-03 DIAGNOSIS — R1012 Left upper quadrant pain: Secondary | ICD-10-CM | POA: Insufficient documentation

## 2024-03-03 DIAGNOSIS — K7400 Hepatic fibrosis, unspecified: Secondary | ICD-10-CM

## 2024-03-03 MED ORDER — REZDIFFRA 80 MG PO TABS: 80.0000 mg | ORAL_TABLET | Freq: Every day | ORAL | 3 refills | Status: DC

## 2024-03-03 NOTE — Progress Notes (Signed)
 Toribio Fortune, M.D. Gastroenterology & Hepatology [facility] Gastroenterology [address] [place], [place] [address]  Primary Care Physician: Caro Harlene POUR, NP [address] [place] [address]  I will communicate my assessment and recommendations to the referring MD via EMR.  Problems: MASH F2 liver fibrosis  History of Present Illness: [name] [name] is a 78 y.o. female with past medical history of GERD, HLD, HTN, fatty liver who presents for follow up of MASH.  The patient was last seen on 09/21/2023. At that time, the patient was ordered to continue Mediterranean diet and to avoid alcohol use.  Elastography was performed.  FibroSure was performed which showed F2 fibrosis.  Repeat CMP performed on 01/07/2024 showed AST 39, ALT 47, total bilirubin 0.6, alk phosphatase 77.Notably, her ferritin normalized to 52.   Patient stopped taking fish oil since April 2025.  States occasionally she may have occasional issues with abdominal pain in her LUQ, which may last for a day and resolve on its own. She states that when this happens, there is a knot in this area. The patient denies having any nausea, vomiting, fever, chills, hematochezia, melena, hematemesis, abdominal distention, abdominal pain, diarrhea, jaundice, pruritus or weight loss.  States that she has tried to implement dietary changes but it has been difficult for her to be consistent with it.  Last Colonoscopy:07/2022- A single non-bleeding colonic angioectasia.                           - Four 2 to 6 mm polyps in the transverse colon and                            in the cecum, removed with a cold snare. Resected                            and retrieved.                           - One 1 mm polyp in the ascending colon, removed                            with a cold biopsy forceps. Resected and retrieved.                           - Diverticulosis in the sigmoid colon and in the                             descending colon.                           - Non-bleeding internal hemorrhoids. (Biopsy: Tubular adenomas x5)   Repeat TCS 3 years   Past Medical History: Past Medical History:  Diagnosis Date   Acid reflux    Bowel obstruction (HCC)    COVID-19    H/O hiatal hernia    Hyperlipidemia    Hypertension     Past Surgical History: Past Surgical History:  Procedure Laterality Date   ABDOMINAL HYSTERECTOMY     APPENDECTOMY     BREAST CYST EXCISION Right    [phone]? benign   COLONOSCOPY  03/18/2012   Procedure: COLONOSCOPY;  Surgeon: Claudis RAYMOND Rivet, MD;  [facility];  Service: Endoscopy;  Laterality: N/A;  930   COLONOSCOPY WITH PROPOFOL  N/A 07/29/2022   Procedure: COLONOSCOPY WITH PROPOFOL ;  Surgeon: Eartha Angelia Sieving, MD;  [facility];  Service: Gastroenterology;  Laterality: N/A;  2:15 PM   EXPLORATORY LAPAROTOMY     LAPAROTOMY N/A 02/14/2013   Procedure: EXPLORATORY LAPAROTOMY;  Surgeon: Oneil DELENA Budge, MD;  Location: [facility];  Service: General;  Laterality: N/A;   MELANOMA EXCISION  03/2019   Dr.Hall, removed from nack    POLYPECTOMY  07/29/2022   Procedure: POLYPECTOMY;  Surgeon: Eartha Angelia Sieving, MD;  [facility];  Service: Gastroenterology;;   SKIN SURGERY  02/2019   Melanoma removed from upper back. Dr. Shona, [place]     Family History: Family History  Problem Relation Age of Onset   Cancer - Other Mother        Kidney   Fibromyalgia Mother    Arthritis Mother    Cancer - Lung Father 75   Heart disease Brother 18       had heart transplant then died 3 years later after restarting smoking   Lung disease Paternal Aunt        Lung Mass   COPD Paternal Uncle    Leukemia Paternal Uncle    Stomach cancer Neg Hx    Esophageal cancer Neg Hx    Pancreatic cancer Neg Hx    Rectal cancer Neg Hx     Social History: Social History   Tobacco Use  Smoking Status Never   Passive exposure:  Never  Smokeless Tobacco Never   Social History   Substance and Sexual Activity  Alcohol Use Yes   Comment: occassionally   Social History   Substance and Sexual Activity  Drug Use No    Allergies: No Known Allergies  Medications: Current Outpatient Medications  Medication Sig Dispense Refill   amLODipine  (NORVASC ) 5 MG tablet TAKE 1 TABLET DAILY 90 tablet 3   Ascorbic Acid (VITAMIN C PO) Take 1 tablet by mouth daily.     aspirin  EC 81 MG tablet Take 81 mg by mouth daily.     beta carotene w/minerals (OCUVITE) tablet Take 1 tablet by mouth in the morning and at bedtime.     Calcium  Carbonate-Vitamin D (CALCIUM  600 + D PO) Take 1 tablet by mouth 2 (two) times daily.     Cholecalciferol (VITAMIN D3) 250 MCG (10000 UT) capsule Take 10,000 Units by mouth daily.     docusate sodium  (COLACE) 100 MG capsule Take 100 mg by mouth 2 (two) times daily.     Lactobacillus-Inulin (CULTURELLE DIGESTIVE HEALTH PO) Take 1 capsule by mouth daily.      losartan  (COZAAR ) 50 MG tablet TAKE 1 TABLET DAILY 90 tablet 3   Misc Natural Products (OSTEO BI-FLEX ADV DOUBLE ST PO) Take 1 tablet by mouth 2 (two) times daily.     omeprazole  (PRILOSEC) 40 MG capsule TAKE 1 CAPSULE DAILY 90 capsule 3   polyethylene glycol powder (GLYCOLAX /MIRALAX ) powder Take 17 g by mouth in the morning and at bedtime.     tiZANidine  (ZANAFLEX ) 4 MG capsule Take 1 capsule (4 mg total) by mouth 2 (two) times daily as needed for muscle spasms. 20 capsule 0   vitamin B-12 (CYANOCOBALAMIN) 1000 MCG tablet Take 1 tablet (1,000 mcg total) by mouth daily. 30 tablet 11   zinc gluconate 50 MG tablet  Take 50 mg by mouth daily.     No current facility-administered medications for this visit.    Review of Systems: GENERAL: negative for malaise, night sweats HEENT: No changes in hearing or vision, no nose bleeds or other nasal problems. NECK: Negative for lumps, goiter, pain and significant neck swelling RESPIRATORY: Negative for  cough, wheezing CARDIOVASCULAR: Negative for chest pain, leg swelling, palpitations, orthopnea GI: SEE HPI MUSCULOSKELETAL: Negative for joint pain or swelling, back pain, and muscle pain. SKIN: Negative for lesions, rash PSYCH: Negative for sleep disturbance, mood disorder and recent psychosocial stressors. HEMATOLOGY Negative for prolonged bleeding, bruising easily, and swollen nodes. ENDOCRINE: Negative for cold or heat intolerance, polyuria, polydipsia and goiter. NEURO: negative for tremor, gait imbalance, syncope and seizures. The remainder of the review of systems is noncontributory.   Physical Exam: BP (!) 137/91 (BP Location: Left Arm, Patient Position: Sitting, Cuff Size: Normal)   Pulse (!) 103   Temp (!) 97.3 F (36.3 C) (Temporal)   Ht 5' 4 (1.626 m)   Wt 188 lb 8 oz (85.5 kg)   LMP 09/10/2014 (Exact Date)   BMI 32.36 kg/m  GENERAL: The patient is AO x3, in no acute distress.  Obese. HEENT: Head is normocephalic and atraumatic. EOMI are intact. Mouth is well hydrated and without lesions. NECK: Supple. No masses LUNGS: Clear to auscultation. No presence of rhonchi/wheezing/rales. Adequate chest expansion HEART: RRR, normal s1 and s2. ABDOMEN: Soft, nontender, no guarding, no peritoneal signs, and nondistended. BS +. No masses. EXTREMITIES: Without any cyanosis, clubbing, rash, lesions or edema. NEUROLOGIC: AOx3, no focal motor deficit. SKIN: no jaundice, no rashes  Imaging/Labs: as above  I personally reviewed and interpreted the available labs, imaging and endoscopic files.  Impression and Plan: [name] [name] is a 78 y.o. female with past medical history of GERD, HLD, HTN, fatty liver who presents for follow up of MASH.  Patient has a longstanding history of MASH with evidence in serology (FibroTest) showing changes of F2 fibrosis.  Unfortunately, she has had mild persistent elevation of her LFTs despite stopping possible drug offending agents.  We  discussed the importance of being proactive to avoid further progression of her liver fibrosis into more advanced stages and cirrhosis.  We discussed the potential complications of this.  I emphasized the importance of implementing lifestyle modifications to prevent this.  We also discussed the possibility of starting Rezdiffra as an adjunctive medication for F2 fibrosis, which she is open to try.  Will need to repeat a CMP in a month after starting this medication.  Patient has presented issues with intermittent abdominal pain and masslike sensation in her left upper quadrant.  We will evaluate this further with a CT of the abdomen and pelvis with IV contrast.  -Schedule CT abdomen/pelvis with IV contrast -Start Rezdiffra 80 mg daily - Check CMP a month after starting the medication -Explained to the patient the etiology and consequences of his current liver disease. Patient was counseled about the benefit of implementing a Mediterranean diet and exercise plan to decrease at least 5% of weight. The patient understood about the importance of lifestyle changes to potentially reverse his liver involvement.  All questions were answered.      Toribio Fortune, MD Gastroenterology and Hepatology [facility] Gastroenterology

## 2024-03-03 NOTE — Patient Instructions (Addendum)
 Schedule CT abdomen/pelvis with IV contrast Start Rezdiffra 80 mg daily Perform blood workup a month after starting the medication Explained to the patient the etiology and consequences of his current liver disease. Patient was counseled about the benefit of implementing a Mediterranean diet and exercise plan to decrease at least 5% of weight. The patient understood about the importance of lifestyle changes to potentially reverse his liver involvement.

## 2024-03-03 NOTE — Telephone Encounter (Signed)
 Spoke with pt, informed her of CT appt on 11/13 at 4pm. Patient aware and verbalized understanding.

## 2024-03-04 ENCOUNTER — Telehealth (INDEPENDENT_AMBULATORY_CARE_PROVIDER_SITE_OTHER): Payer: Self-pay

## 2024-03-04 NOTE — Telephone Encounter (Signed)
 At OV on 03/03/2024 Per Dr. Eartha, Start patient on Rezdiffra 80 mg one po every day.

## 2024-03-04 NOTE — Telephone Encounter (Signed)
 Error

## 2024-03-04 NOTE — Telephone Encounter (Signed)
 I called the patient and asked if she had a prescription drug card. She will call me to let me know this information.

## 2024-03-07 NOTE — Telephone Encounter (Signed)
 Patient brought the medication prescription card by the office today, I spoke with the patient made her aware the pharmacy may reach out to her regarding the [name] [name], but I would be working on the PA for this. Patient states understanding.

## 2024-03-08 ENCOUNTER — Telehealth (INDEPENDENT_AMBULATORY_CARE_PROVIDER_SITE_OTHER): Payer: Self-pay

## 2024-03-08 NOTE — Telephone Encounter (Signed)
 I called and left a Vm asked that the patient please return call to the office. I need to make her aware her insurance has approved her Rezdiffra, and give her the phone number to [facility] of [phone] for her to call and set up payment information and co pay options, and shipment details. Patient will need to ask if they have a co pay assistance. Also need for the patient to notify us  of when she starts the Rezdiffra as she will need a CMP one month after starting the Rezdiffra.

## 2024-03-08 NOTE — Telephone Encounter (Signed)
 [address] [address] [place], [place] [address] Iley Deignan [address] [place]  [address] [place], [place] [address] 03/08/2024 Member ID: [id number] Authorization #: [id number] Dear Gaylan Cornea: Arlina has approved a request from you or your doctor for [facility] Tablet 80MG . This approval is for 02/23/2024 until further notice. This drug has been approved under the Member's Medicare Part D benefit. Approved quantity: 90 units per 90 day(s). You may fill up to a 90 day supply except for those on Specialty Tier 5, which can be filled up to a 30 day supply. Please call the pharmacy to process the prescription claim. This approval is good only for the dates listed above. If you stay on this drugs after the end date, you may need another approval. If you don't get prior approval from us , you may have to pay the full price for the drug. If you have questions, please call Member Services at [phone] (TTY: 711). From October 1 to March 31, you can call us  7 days a week from 8 a.m. to 8 p.m. From April 1 to September 30, you can call us  Monday through Friday from 8 a.m. to 8 p.m. A messaging system is used after hours, weekends, and on federal holidays. Sincerely, Arlina Bernardino Mcclintock Pharmacist cc: Requesting Doctor Name: Toribio Fortune  Requesting Doctor Fax Number: [phone] "Arlina is issued by [employer]

## 2024-03-08 NOTE — Telephone Encounter (Signed)
 Sent PA through to patient plan, awaiting a determination.

## 2024-03-10 ENCOUNTER — Encounter: Payer: Self-pay | Admitting: Nurse Practitioner

## 2024-03-10 NOTE — Telephone Encounter (Signed)
 I spoke with the patient and made her aware the medication has been approved and she will need to reach out to [phone] to set up co pay and shipping details. She is also aware to let me know when she starts the medication so we can order her blood work. Patient states understanding of all.

## 2024-03-13 ENCOUNTER — Encounter (INDEPENDENT_AMBULATORY_CARE_PROVIDER_SITE_OTHER): Payer: Self-pay | Admitting: Gastroenterology

## 2024-03-14 NOTE — Telephone Encounter (Signed)
 Message routed to covering provider Tawni America, NP.

## 2024-03-16 ENCOUNTER — Encounter (INDEPENDENT_AMBULATORY_CARE_PROVIDER_SITE_OTHER): Payer: Self-pay | Admitting: Gastroenterology

## 2024-03-16 NOTE — Telephone Encounter (Signed)
 Noted

## 2024-03-16 NOTE — Telephone Encounter (Signed)
 Never mind, I see her note stating she has decided not to pursue the medication. Thanks

## 2024-03-28 ENCOUNTER — Encounter: Payer: Self-pay | Admitting: Nurse Practitioner

## 2024-03-28 ENCOUNTER — Ambulatory Visit: Payer: Medicare Other | Admitting: Nurse Practitioner

## 2024-03-28 VITALS — BP 122/84 | HR 72 | Temp 97.7°F | Ht 64.0 in | Wt 190.0 lb

## 2024-03-28 DIAGNOSIS — Z0000 Encounter for general adult medical examination without abnormal findings: Secondary | ICD-10-CM

## 2024-03-28 DIAGNOSIS — Z23 Encounter for immunization: Secondary | ICD-10-CM | POA: Diagnosis not present

## 2024-03-28 NOTE — Patient Instructions (Signed)
  [name] [name] , Thank you for taking time to come for your Medicare Wellness Visit. I appreciate your ongoing commitment to your health goals. Please review the following plan we discussed and let me know if I can assist you in the future.     This is a list of the screening recommended for you and due dates:  Health Maintenance  Topic Date Due   COVID-19 Vaccine (8 - 2025-26 season) 02/01/2024   Medicare Annual Wellness Visit  03/28/2025   Colon Cancer Screening  07/29/2025   DEXA scan (bone density measurement)  12/25/2025   DTaP/Tdap/Td vaccine (2 - Td or Tdap) 03/24/2028   Pneumococcal Vaccine for age over 28  Completed   Flu Shot  Completed   Hepatitis C Screening  Completed   Zoster (Shingles) Vaccine  Completed   Meningitis B Vaccine  Aged Out   Breast Cancer Screening  Discontinued

## 2024-03-28 NOTE — Progress Notes (Signed)
 Subjective:   [name] [name] is a 78 y.o. female who presents for Medicare Annual (Subsequent) preventive examination.  Visit Complete: In person [facility]     Cardiac Risk Factors include: advanced age (>33men, >76 women);hypertension;obesity (BMI >30kg/m2)     Objective:    Today's Vitals   03/28/24 1522  BP: 122/84  Pulse: 72  Temp: 97.7 F (36.5 C)  SpO2: 92%  Weight: 190 lb (86.2 kg)  Height: 5' 4 (1.626 m)   Body mass index is 32.61 kg/m.     08/24/2023    2:12 PM 03/23/2023    3:32 PM 02/23/2023    1:00 PM 02/23/2023    9:25 AM 07/29/2022   12:17 PM 07/25/2022   11:45 AM 03/07/2022    3:11 PM  Advanced Directives  Does Patient Have a Medical Advance Directive? Yes Yes Yes Yes Yes Yes Yes  Type of Estate Agent of [place];Out of facility DNR (pink MOST or yellow form);Living will Healthcare Power of [place];Living will;Out of facility DNR (pink MOST or yellow form) Healthcare Power of [place];Out of facility DNR (pink MOST or yellow form);Living will Out of facility DNR (pink MOST or yellow form) Healthcare Power of [place];Living will Living will;Healthcare Power of [employer] Power of [place];Living will;Out of facility DNR (pink MOST or yellow form)  Does patient want to make changes to medical advance directive? No - Patient declined No - Patient declined No - Patient declined No - Patient declined   No - Patient declined  Copy of Healthcare Power of Attorney in Chart? Yes - validated most recent copy scanned in chart (See row information) Yes - validated most recent copy scanned in chart (See row information) Yes - validated most recent copy scanned in chart (See row information)  No - copy requested  Yes - validated most recent copy scanned in chart (See row information)  Would patient like information on creating a medical advance directive?      No - Patient declined   Pre-existing out of facility DNR order (yellow form or  pink MOST form)  Pink MOST/Yellow Form most recent copy in chart - Physician notified to receive inpatient order  Yellow form placed in chart (order not valid for inpatient use);Pink MOST form placed in chart (order not valid for inpatient use)   Yellow form placed in chart (order not valid for inpatient use)    Current Medications (verified) Outpatient Encounter Medications as of 03/28/2024  Medication Sig   amLODipine  (NORVASC ) 5 MG tablet TAKE 1 TABLET DAILY   Ascorbic Acid (VITAMIN C PO) Take 1 tablet by mouth daily.   aspirin  EC 81 MG tablet Take 81 mg by mouth daily.   beta carotene w/minerals (OCUVITE) tablet Take 1 tablet by mouth in the morning and at bedtime.   Calcium  Carbonate-Vitamin D (CALCIUM  600 + D PO) Take 1 tablet by mouth 2 (two) times daily.   Cholecalciferol (VITAMIN D3) 250 MCG (10000 UT) capsule Take 10,000 Units by mouth daily.   DENTA 5000 PLUS 1.1 % CREA dental cream Take 1 Application by mouth at bedtime.   docusate sodium  (COLACE) 100 MG capsule Take 100 mg by mouth 2 (two) times daily.   Lactobacillus-Inulin (CULTURELLE DIGESTIVE HEALTH PO) Take 1 capsule by mouth daily.    losartan  (COZAAR ) 50 MG tablet TAKE 1 TABLET DAILY   Misc Natural Products (OSTEO BI-FLEX ADV DOUBLE ST PO) Take 1 tablet by mouth 2 (two) times daily.   omeprazole  (PRILOSEC) 40  MG capsule TAKE 1 CAPSULE DAILY   polyethylene glycol powder (GLYCOLAX /MIRALAX ) powder Take 17 g by mouth in the morning and at bedtime.   tiZANidine  (ZANAFLEX ) 4 MG capsule Take 1 capsule (4 mg total) by mouth 2 (two) times daily as needed for muscle spasms.   vitamin B-12 (CYANOCOBALAMIN) 1000 MCG tablet Take 1 tablet (1,000 mcg total) by mouth daily.   zinc gluconate 50 MG tablet Take 50 mg by mouth daily.   [DISCONTINUED] Resmetirom (REZDIFFRA) 80 MG TABS Take 80 mg by mouth daily.   No facility-administered encounter medications on file as of 03/28/2024.    Allergies (verified) Patient has no known allergies.    History: Past Medical History:  Diagnosis Date   Acid reflux    Bowel obstruction (HCC)    COVID-19    H/O hiatal hernia    Hyperlipidemia    Hypertension    Past Surgical History:  Procedure Laterality Date   ABDOMINAL HYSTERECTOMY     APPENDECTOMY     BREAST CYST EXCISION Right    [phone]? benign   COLONOSCOPY  03/18/2012   Procedure: COLONOSCOPY;  Surgeon: Claudis RAYMOND Rivet, MD;  [facility];  Service: Endoscopy;  Laterality: N/A;  930   COLONOSCOPY WITH PROPOFOL  N/A 07/29/2022   Procedure: COLONOSCOPY WITH PROPOFOL ;  Surgeon: Eartha Angelia Sieving, MD;  [facility];  Service: Gastroenterology;  Laterality: N/A;  2:15 PM   EXPLORATORY LAPAROTOMY     LAPAROTOMY N/A 02/14/2013   Procedure: EXPLORATORY LAPAROTOMY;  Surgeon: Oneil DELENA Budge, MD;  Location: [facility];  Service: General;  Laterality: N/A;   MELANOMA EXCISION  03/2019   Dr.Hall, removed from nack    POLYPECTOMY  07/29/2022   Procedure: POLYPECTOMY;  Surgeon: Eartha Angelia Sieving, MD;  [facility];  Service: Gastroenterology;;   SKIN SURGERY  02/2019   Melanoma removed from upper back. Dr. Shona, [place]    Family History  Problem Relation Age of Onset   Cancer - Other Mother        Kidney   Fibromyalgia Mother    Arthritis Mother    Cancer - Lung Father 55   Heart disease Brother 92       had heart transplant then died 3 years later after restarting smoking   Lung disease Paternal Aunt        Lung Mass   COPD Paternal Uncle    Leukemia Paternal Uncle    Stomach cancer Neg Hx    Esophageal cancer Neg Hx    Pancreatic cancer Neg Hx    Rectal cancer Neg Hx    Social History   Socioeconomic History   Marital status: Single    Spouse name: Not on file   Number of children: Not on file   Years of education: Not on file   Highest education level: Not on file  Occupational History   Not on file  Tobacco Use   Smoking status: Never    Passive exposure: Never    Smokeless tobacco: Never  Vaping Use   Vaping status: Never Used  Substance and Sexual Activity   Alcohol use: Yes    Comment: occassionally  As of 03/28/2024 pt no longer drinks   Drug use: No   Sexual activity: Not Currently  Other Topics Concern   Not on file  Social History Narrative   Social History      Diet?       Do you drink/eat things with caffeine? yes  Marital status?            single                        What year were you married?      Do you live in a house, apartment, assisted living, condo, trailer, etc.? house      Is it one or more stories? Yes but seldom go upstairs      How many persons live in your home? 1       Do you have any pets in your home? (please list) no       Highest level of education completed? 12- business college      Current or past profession: retired      Do you exercise?                  yes                    Type & how often? 5 days a week- 1 hour per day      Advanced Directives      Do you have a living will? yes      Do you have a DNR form?                                  If not, do you want to discuss one? yes      Do you have signed POA/HPOA for forms?  yes      Functional Status      Do you have difficulty bathing or dressing yourself? no      Do you have difficulty preparing food or eating? no      Do you have difficulty managing your medications? no      Do you have difficulty managing your finances?  no      Do you have difficulty affording your medications?  no      Social Drivers of Corporate Investment Banker Strain: Not on file  Food Insecurity: Not on file  Transportation Needs: Not on file  Physical Activity: Not on file  Stress: Not on file  Social Connections: Not on file    Tobacco Counseling Counseling given: Not Answered   Clinical Intake:  Pre-visit preparation completed: Yes  Pain : No/denies pain     BMI - recorded: 32.61 Nutritional Status: BMI > 30  Obese  How  often do you need to have someone help you when you read instructions, pamphlets, or other written materials from your doctor or pharmacy?: 1 - Never         Activities of Daily Living    03/28/2024    3:41 PM 03/28/2024   12:57 PM  In your present state of health, do you have any difficulty performing the following activities:  Hearing? 0 0  Vision? 0 0  Difficulty concentrating or making decisions? 0 0  Walking or climbing stairs? 0 0  Dressing or bathing? 0 0  Doing errands, shopping? 0 0  Preparing Food and eating ? N N  Using the Toilet? N N  In the past six months, have you accidently leaked urine? N N  Do you have problems with loss of bowel control? N N  Managing your Medications? N N  Managing your Finances? N N  Housekeeping or managing your Housekeeping? N N  Patient Care Team: Caro Harlene POUR, NP as PCP - General (Geriatric Medicine) Octavia Bruckner, MD as Consulting Physician (Ophthalmology)  Indicate any recent Medical Services you may have received from other than [facility] providers in the past year (date may be approximate).     Assessment:   This is a routine wellness examination for Georgine.  Hearing/Vision screen Hearing Screening - Comments:: Its okay but pt can tell its a difference  Vision Screening - Comments:: Pt had surgery in left eye , but vision is fine   Goals Addressed   None    Depression Screen    03/28/2024    3:25 PM 08/24/2023    2:12 PM 03/23/2023    3:34 PM 02/23/2023   12:59 PM 03/07/2022    3:12 PM 02/19/2021    9:33 AM 04/11/2020    3:13 PM  PHQ 2/9 Scores  PHQ - 2 Score 0 0 0 0 0 0 0    Fall Risk    03/28/2024   12:57 PM 08/24/2023    2:12 PM 03/23/2023    3:34 PM 02/23/2023   12:59 PM 03/07/2022    3:11 PM  Fall Risk   Falls in the past year? 0 0 0 1 1  Number falls in past yr: 0 0 0 0 1  Injury with Fall? 0 0 0 1 1  Risk for fall due to :  No Fall Risks No Fall Risks  History of fall(s)  Follow up  Falls  evaluation completed Falls evaluation completed      MEDICARE RISK AT HOME: Medicare Risk at Home Any stairs in or around the home?: Yes If so, are there any without handrails?: Yes Home free of loose throw rugs in walkways, pet beds, electrical cords, etc?: Yes Adequate lighting in your home to reduce risk of falls?: Yes Life alert?: No Use of a cane, walker or w/c?: No Grab bars in the bathroom?: Yes Shower chair or bench in shower?: No Elevated toilet seat or a handicapped toilet?: Yes  TIMED UP AND GO:  Was the test performed?  no  Cognitive Function:    03/23/2023    3:38 PM 02/14/2019   11:13 AM  MMSE - Mini Mental State Exam  Orientation to time 5 4  Orientation to Place 5 5  Registration 3 3  Attention/ Calculation 5 5  Recall 2 3  Language- name 2 objects 2 2  Language- repeat 1 1  Language- follow 3 step command 3 3  Language- read & follow direction 1 1  Write a sentence 1 1  Copy design 1 1  Total score 29 29        03/28/2024    3:26 PM 03/07/2022    3:14 PM 02/19/2021    9:35 AM 02/16/2020    8:58 AM  6CIT Screen  What Year? 0 points 0 points 0 points 0 points  What month? 0 points 0 points 0 points 0 points  What time? 0 points 0 points 0 points 0 points  Count back from 20 0 points 0 points 0 points 0 points  Months in reverse 0 points 0 points 0 points 0 points  Repeat phrase 0 points 0 points 0 points 2 points  Total Score 0 points 0 points 0 points 2 points    Immunizations Immunization History  Administered Date(s) Administered    sv, Bivalent, Protein Subunit Rsvpref,pf (Abrysvo) 03/19/2022   Fluad Quad(high Dose 65+) 02/14/2019, 02/11/2021, 02/24/2022   Fluad Trivalent(High  Dose 65+) 02/23/2023   INFLUENZA, HIGH DOSE SEASONAL PF 03/26/2017, 03/23/2018, 04/03/2020, 03/28/2024   Influenza,inj,Quad PF,6+ Mos 02/13/2013, 03/26/2015   Influenza-Unspecified 06/02/2016   Moderna Covid-19 Fall Seasonal Vaccine 6yrs & older 04/16/2023    Moderna Covid-19 Vaccine  Bivalent Booster 28yrs & up 03/19/2022   PFIZER(Purple Top)SARS-COV-2 Vaccination 06/24/2019, 07/15/2019, 02/27/2020, 10/24/2020   PPD Test 08/10/2018   Pfizer Covid-19 Vaccine Bivalent Booster 61yrs & up 04/22/2021   Pneumococcal Conjugate-13 03/24/2014   Pneumococcal Polysaccharide-23 04/23/2018   Tdap 03/24/2018   Tetanus 06/02/2001   Zoster Recombinant(Shingrix) 03/24/2018, 06/01/2018   Zoster, Live 06/02/2012    TDAP status: Up to date  Flu Vaccine status: Up to date  Pneumococcal vaccine status: Up to date  Covid-19 vaccine status: Information provided on how to obtain vaccines.   Qualifies for Shingles Vaccine? Yes   Zostavax completed No   Shingrix Completed?: Yes  Screening Tests Health Maintenance  Topic Date Due   COVID-19 Vaccine (8 - 2025-26 season) 02/01/2024   Medicare Annual Wellness (AWV)  03/28/2025   Colonoscopy  07/29/2025   DEXA SCAN  12/25/2025   DTaP/Tdap/Td (2 - Td or Tdap) 03/24/2028   Pneumococcal Vaccine: 50+ Years  Completed   Influenza Vaccine  Completed   Hepatitis C Screening  Completed   Zoster Vaccines- Shingrix  Completed   Meningococcal B Vaccine  Aged Out   Mammogram  Discontinued    Health Maintenance  Health Maintenance Due  Topic Date Due   COVID-19 Vaccine (8 - 2025-26 season) 02/01/2024    Colorectal cancer screening: Type of screening: Colonoscopy. Completed 2024. Repeat every 3 years  Mammogram status: No longer required due to age.  Bone Density status: Completed 11/2020. Results reflect: Bone density results: NORMAL. Repeat every 5 years.  Lung Cancer Screening: (Low Dose CT Chest recommended if Age 60-80 years, 20 pack-year currently smoking OR have quit w/in 15years.) does not qualify.   Lung Cancer Screening Referral: na  Additional Screening:  Hepatitis C Screening: does qualify; Completed   Vision Screening: Recommended annual ophthalmology exams for early detection of glaucoma and  other disorders of the eye. Is the patient up to date with their annual eye exam?  Yes  Who is the provider or what is the name of the office in which the patient attends annual eye exams? [facility] If pt is not established with a provider, would they like to be referred to a provider to establish care? No .   Dental Screening: Recommended annual dental exams for proper oral hygiene    Community Resource Referral / Chronic Care Management: CRR required this visit?  No   CCM required this visit?  No     Plan:     I have personally reviewed and noted the following in the patient's chart:   Medical and social history Use of alcohol, tobacco or illicit drugs  Current medications and supplements including opioid prescriptions. Patient is not currently taking opioid prescriptions. Functional ability and status Nutritional status Physical activity Advanced directives List of other physicians Hospitalizations, surgeries, and ER visits in previous 12 months Vitals Screenings to include cognitive, depression, and falls Referrals and appointments  In addition, I have reviewed and discussed with patient certain preventive protocols, quality metrics, and best practice recommendations. A written personalized care plan for preventive services as well as general preventive health recommendations were provided to patient.     Harlene MARLA An, NP   03/28/2024

## 2024-03-31 ENCOUNTER — Encounter: Payer: Self-pay | Admitting: Nurse Practitioner

## 2024-04-14 ENCOUNTER — Encounter: Payer: Self-pay | Admitting: Nurse Practitioner

## 2024-04-14 ENCOUNTER — Ambulatory Visit (HOSPITAL_COMMUNITY)
Admission: RE | Admit: 2024-04-14 | Discharge: 2024-04-14 | Disposition: A | Discharge status: Home / Self Care | Source: Ambulatory Visit | Attending: Gastroenterology | Admitting: Gastroenterology

## 2024-04-14 DIAGNOSIS — R1012 Left upper quadrant pain: Secondary | ICD-10-CM | POA: Insufficient documentation

## 2024-04-14 DIAGNOSIS — K5730 Diverticulosis of large intestine without perforation or abscess without bleeding: Secondary | ICD-10-CM | POA: Diagnosis not present

## 2024-04-14 MED ORDER — IOHEXOL 300 MG/ML  SOLN
100.0000 mL | Freq: Once | INTRAMUSCULAR | Status: AC | PRN
  Administered 2024-04-14: 100 mL via INTRAVENOUS

## 2024-04-18 ENCOUNTER — Ambulatory Visit: Payer: Self-pay | Admitting: Nurse Practitioner

## 2024-04-20 ENCOUNTER — Ambulatory Visit (INDEPENDENT_AMBULATORY_CARE_PROVIDER_SITE_OTHER): Payer: Self-pay | Admitting: Gastroenterology

## 2024-04-20 NOTE — Telephone Encounter (Signed)
 The patient called back and I explained what the findings were, and that it was pretty much inflammation around the intestines. She says you mentioned something about her sciatica. I advised, I did not see any mention of the Sciatica. I sent this call to [place] to have the patient scheduled for an appointment to discuss the most recent findings. Please advise.

## 2024-05-30 ENCOUNTER — Ambulatory Visit: Admitting: Nurse Practitioner

## 2024-05-30 VITALS — BP 128/80 | HR 90 | Temp 97.6°F | Ht 64.0 in | Wt 192.4 lb

## 2024-05-30 DIAGNOSIS — I10 Essential (primary) hypertension: Secondary | ICD-10-CM

## 2024-05-30 DIAGNOSIS — M5442 Lumbago with sciatica, left side: Secondary | ICD-10-CM | POA: Diagnosis not present

## 2024-05-30 DIAGNOSIS — G8929 Other chronic pain: Secondary | ICD-10-CM | POA: Diagnosis not present

## 2024-05-30 DIAGNOSIS — K219 Gastro-esophageal reflux disease without esophagitis: Secondary | ICD-10-CM

## 2024-05-30 DIAGNOSIS — E785 Hyperlipidemia, unspecified: Secondary | ICD-10-CM | POA: Diagnosis not present

## 2024-05-30 DIAGNOSIS — K760 Fatty (change of) liver, not elsewhere classified: Secondary | ICD-10-CM

## 2024-05-30 NOTE — Progress Notes (Signed)
 "   Careteam: Patient Care Team: Caro Harlene POUR, NP as PCP - General (Geriatric Medicine) Octavia Bruckner, MD as Consulting Physician (Ophthalmology)  PLACE OF SERVICE:  [facility]  Advanced Directive information    Allergies[1]  Chief Complaint  Patient presents with   medication management of chronic isses    Patient presents today for routine 6 month follow up. Care gaps have been addressed.     HPI:  Discussed the use of AI scribe software for clinical note transcription with the patient, who gave verbal consent to proceed.  History of Present Illness [name] [name] is a 78 year old female who presents for a six-month follow-up visit.  She experiences pain in the buttocks area, which sometimes radiates down her leg. The pain began after using an producer, television/film/video in October. She takes turmeric for inflammation, which she feels has helped immensely. She also experiences low back pain, especially when standing for long periods, and finds relief by sitting down. She has adjusted her sitting posture to alleviate discomfort.  She has a history of elevated liver enzymes, which led to a consultation with a gastroenterologist. A CT scan and ultrasound were performed; the patient recalls being told there were no suspicious findings related to the liver. Her liver enzymes improved after discontinuing fish oil supplements. She is not currently experiencing abdominal pain.  Her hypertension is well-controlled with losartan  and amlodipine .   She also takes omeprazole  for acid reflux, which is well-managed.   She has a history of high cholesterol but is not on medication due to previous liver enzyme spikes.  No changes in bowel movements, urination, shortness of breath, chest pain, or swelling.    Review of Systems:  Review of Systems  Constitutional:  Negative for chills, fever and weight loss.  HENT:  Negative for tinnitus.   Respiratory:  Negative for cough,  sputum production and shortness of breath.   Cardiovascular:  Negative for chest pain, palpitations and leg swelling.  Gastrointestinal:  Negative for abdominal pain, constipation, diarrhea and heartburn.  Genitourinary:  Negative for dysuria, frequency and urgency.  Musculoskeletal:  Positive for back pain. Negative for falls, joint pain and myalgias.  Skin: Negative.   Neurological:  Negative for dizziness and headaches.  Psychiatric/Behavioral:  Negative for depression and memory loss. The patient does not have insomnia.     Past Medical History:  Diagnosis Date   Acid reflux    Bowel obstruction (HCC)    COVID-19    H/O hiatal hernia    Hyperlipidemia    Hypertension    Past Surgical History:  Procedure Laterality Date   ABDOMINAL HYSTERECTOMY     APPENDECTOMY     BREAST CYST EXCISION Right    [phone]? benign   COLONOSCOPY  03/18/2012   Procedure: COLONOSCOPY;  Surgeon: Claudis RAYMOND Rivet, MD;  [facility];  Service: Endoscopy;  Laterality: N/A;  930   COLONOSCOPY WITH PROPOFOL  N/A 07/29/2022   Procedure: COLONOSCOPY WITH PROPOFOL ;  Surgeon: Eartha Angelia Sieving, MD;  [facility];  Service: Gastroenterology;  Laterality: N/A;  2:15 PM   EXPLORATORY LAPAROTOMY     LAPAROTOMY N/A 02/14/2013   Procedure: EXPLORATORY LAPAROTOMY;  Surgeon: Oneil DELENA Budge, MD;  Location: [facility];  Service: General;  Laterality: N/A;   MELANOMA EXCISION  03/2019   Dr.Hall, removed from nack    POLYPECTOMY  07/29/2022   Procedure: POLYPECTOMY;  Surgeon: Eartha Angelia Sieving, MD;  [facility];  Service: Gastroenterology;;  SKIN SURGERY  02/2019   Melanoma removed from upper back. Dr. Shona, [place]    Social History:   reports that she has never smoked. She has never been exposed to tobacco smoke. She has never used smokeless tobacco. She reports current alcohol use. She reports that she does not use drugs.  Family History  Problem Relation Age of Onset    Cancer - Other Mother        Kidney   Fibromyalgia Mother    Arthritis Mother    Cancer - Lung Father 55   Heart disease Brother 26       had heart transplant then died 3 years later after restarting smoking   Lung disease Paternal Aunt        Lung Mass   COPD Paternal Uncle    Leukemia Paternal Uncle    Stomach cancer Neg Hx    Esophageal cancer Neg Hx    Pancreatic cancer Neg Hx    Rectal cancer Neg Hx     Medications: Patient's Medications  New Prescriptions   No medications on file  Previous Medications   AMLODIPINE  (NORVASC ) 5 MG TABLET    TAKE 1 TABLET DAILY   ASCORBIC ACID (VITAMIN C PO)    Take 1 tablet by mouth daily.   ASPIRIN  EC 81 MG TABLET    Take 81 mg by mouth daily.   BETA CAROTENE W/MINERALS (OCUVITE) TABLET    Take 1 tablet by mouth in the morning and at bedtime.   CALCIUM  CARBONATE-VITAMIN D (CALCIUM  600 + D PO)    Take 1 tablet by mouth 2 (two) times daily.   CHOLECALCIFEROL (VITAMIN D3) 250 MCG (10000 UT) CAPSULE    Take 10,000 Units by mouth daily.   DENTA 5000 PLUS 1.1 % CREA DENTAL CREAM    Take 1 Application by mouth at bedtime.   DOCUSATE SODIUM  (COLACE) 100 MG CAPSULE    Take 100 mg by mouth 2 (two) times daily.   LACTOBACILLUS-INULIN (CULTURELLE DIGESTIVE HEALTH PO)    Take 1 capsule by mouth daily.    LOSARTAN  (COZAAR ) 50 MG TABLET    TAKE 1 TABLET DAILY   MISC NATURAL PRODUCTS (OSTEO BI-FLEX ADV DOUBLE ST PO)    Take 1 tablet by mouth 2 (two) times daily.   OMEPRAZOLE  (PRILOSEC) 40 MG CAPSULE    TAKE 1 CAPSULE DAILY   POLYETHYLENE GLYCOL POWDER (GLYCOLAX /MIRALAX ) POWDER    Take 17 g by mouth in the morning and at bedtime.   TIZANIDINE  (ZANAFLEX ) 4 MG CAPSULE    Take 1 capsule (4 mg total) by mouth 2 (two) times daily as needed for muscle spasms.   TURMERIC CURCUMIN CAPS    Take 3 capsules by mouth daily.   VITAMIN B-12 (CYANOCOBALAMIN) 1000 MCG TABLET    Take 1 tablet (1,000 mcg total) by mouth daily.   ZINC GLUCONATE 50 MG TABLET    Take 50 mg by  mouth daily.  Modified Medications   No medications on file  Discontinued Medications   No medications on file    Physical Exam:  Vitals:   05/30/24 1455  BP: 128/80  Pulse: 90  Temp: 97.6 F (36.4 C)  SpO2: 97%  Weight: 192 lb 6.4 oz (87.3 kg)  Height: 5' 4 (1.626 m)   Body mass index is 33.03 kg/m. Wt Readings from Last 3 Encounters:  05/30/24 192 lb 6.4 oz (87.3 kg)  03/28/24 190 lb (86.2 kg)  03/03/24 188 lb 8 oz (85.5 kg)  Physical Exam Constitutional:      General: She is not in acute distress.    Appearance: She is well-developed. She is not diaphoretic.  HENT:     Head: Normocephalic and atraumatic.     Mouth/Throat:     Pharynx: No oropharyngeal exudate.  Eyes:     Conjunctiva/sclera: Conjunctivae normal.     Pupils: Pupils are equal, round, and reactive to light.  Cardiovascular:     Rate and Rhythm: Normal rate and regular rhythm.     Heart sounds: Normal heart sounds.  Pulmonary:     Effort: Pulmonary effort is normal.     Breath sounds: Normal breath sounds.  Abdominal:     General: Bowel sounds are normal.     Palpations: Abdomen is soft.  Musculoskeletal:     Cervical back: Normal range of motion and neck supple.     Right lower leg: No edema.     Left lower leg: No edema.  Skin:    General: Skin is warm and dry.  Neurological:     Mental Status: She is alert.  Psychiatric:        Mood and Affect: Mood normal.     Labs reviewed: Basic Metabolic Panel: Recent Labs    08/24/23 1425 01/07/24 0717  NA 140 140  K 4.7 4.3  CL 103 106  CO2 28 26  GLUCOSE 80 96  BUN 12 10  CREATININE 0.60 0.72  CALCIUM  9.9 9.3   Liver Function Tests: Recent Labs    08/24/23 1425 09/21/23 1316 01/07/24 0717  AST 78*  --  39*  ALT 95* 78* 47*  BILITOT 0.9  --  0.6  PROT 7.0  --  6.5   No results for input(s): LIPASE, AMYLASE in the last 8760 hours. No results for input(s): AMMONIA in the last 8760 hours. CBC: Recent Labs     08/24/23 1425  WBC 9.4  NEUTROABS 4,954  HGB 15.6*  HCT 46.4*  MCV 94.9  PLT 297   Lipid Panel: Recent Labs    08/24/23 1425  CHOL 179  HDL 43*  LDLCALC 96  TRIG 716*  CHOLHDL 4.2   TSH: No results for input(s): TSH in the last 8760 hours. 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  - Ordered physical therapy for low back pain and sciatica. - Continue turmeric for inflammation.  Essential hypertension Hypertension well controlled on losartan  and amlodipine .  Hepatic steatosis Improved liver enzymes post fish oil discontinuation. Monitoring required due to supplement impact. - Rechecked liver enzymes today.  Hyperlipidemia Previously managed with medication, discontinued due to liver enzyme elevation. Currently untreated.continue dietary modifications  Gastroesophageal reflux disease GERD well managed on omeprazole .   Return in about 6 months (around 11/28/2024) for routine follow up.  [facility] Prajapati K. [facility] [phone]     [1] No Known Allergies  "

## 2024-05-31 ENCOUNTER — Ambulatory Visit: Payer: Self-pay | Admitting: Nurse Practitioner

## 2024-05-31 LAB — CBC WITH DIFFERENTIAL/PLATELET
Absolute Lymphocytes: 3240 {cells}/uL (ref 850–3900)
Absolute Monocytes: 1102 {cells}/uL — ABNORMAL HIGH (ref 200–950)
Basophils Absolute: 151 {cells}/uL (ref 0–200)
Basophils Relative: 1.4 %
Eosinophils Absolute: 659 {cells}/uL — ABNORMAL HIGH (ref 15–500)
Eosinophils Relative: 6.1 %
HCT: 45.2 % (ref 35.9–46.0)
Hemoglobin: 15.1 g/dL (ref 11.7–15.5)
MCH: 31.9 pg (ref 27.0–33.0)
MCHC: 33.4 g/dL (ref 31.6–35.4)
MCV: 95.4 fL (ref 81.4–101.7)
MPV: 12.4 fL (ref 7.5–12.5)
Monocytes Relative: 10.2 %
Neutro Abs: 5648 {cells}/uL (ref 1500–7800)
Neutrophils Relative %: 52.3 %
Platelets: 304 Thousand/uL (ref 140–400)
RBC: 4.74 Million/uL (ref 3.80–5.10)
RDW: 12.1 % (ref 11.0–15.0)
Total Lymphocyte: 30 %
WBC: 10.8 Thousand/uL (ref 3.8–10.8)

## 2024-05-31 LAB — COMPREHENSIVE METABOLIC PANEL WITH GFR
AG Ratio: 1.6 (calc) (ref 1.0–2.5)
ALT: 43 U/L — ABNORMAL HIGH (ref 6–29)
AST: 32 U/L (ref 10–35)
Albumin: 4.2 g/dL (ref 3.6–5.1)
Alkaline phosphatase (APISO): 85 U/L (ref 37–153)
BUN/Creatinine Ratio: 22 (calc) (ref 6–22)
BUN: 12 mg/dL (ref 7–25)
CO2: 26 mmol/L (ref 20–32)
Calcium: 9.4 mg/dL (ref 8.6–10.4)
Chloride: 103 mmol/L (ref 98–110)
Creat: 0.55 mg/dL — ABNORMAL LOW (ref 0.60–1.00)
Globulin: 2.7 g/dL (ref 1.9–3.7)
Glucose, Bld: 97 mg/dL (ref 65–139)
Potassium: 4.6 mmol/L (ref 3.5–5.3)
Sodium: 141 mmol/L (ref 135–146)
Total Bilirubin: 0.5 mg/dL (ref 0.2–1.2)
Total Protein: 6.9 g/dL (ref 6.1–8.1)
eGFR: 94 mL/min/1.73m2

## 2024-06-01 ENCOUNTER — Other Ambulatory Visit: Payer: Self-pay | Admitting: Nurse Practitioner

## 2024-06-11 ENCOUNTER — Telehealth: Admitting: Nurse Practitioner

## 2024-06-11 DIAGNOSIS — B3731 Acute candidiasis of vulva and vagina: Secondary | ICD-10-CM

## 2024-06-11 MED ORDER — FLUCONAZOLE 150 MG PO TABS: 150.0000 mg | ORAL_TABLET | ORAL | 0 refills | Status: AC | PRN

## 2024-06-11 NOTE — Progress Notes (Signed)

## 2024-06-12 ENCOUNTER — Emergency Department (HOSPITAL_COMMUNITY)
Admission: EM | Admit: 2024-06-12 | Discharge: 2024-06-12 | Disposition: A | Discharge status: Home / Self Care | Attending: Emergency Medicine | Admitting: Emergency Medicine

## 2024-06-12 ENCOUNTER — Telehealth: Admitting: Family

## 2024-06-12 ENCOUNTER — Other Ambulatory Visit: Payer: Self-pay

## 2024-06-12 DIAGNOSIS — N898 Other specified noninflammatory disorders of vagina: Secondary | ICD-10-CM

## 2024-06-12 DIAGNOSIS — R300 Dysuria: Secondary | ICD-10-CM

## 2024-06-12 DIAGNOSIS — Z7982 Long term (current) use of aspirin: Secondary | ICD-10-CM | POA: Insufficient documentation

## 2024-06-12 DIAGNOSIS — N760 Acute vaginitis: Secondary | ICD-10-CM | POA: Diagnosis not present

## 2024-06-12 LAB — URINALYSIS, ROUTINE W REFLEX MICROSCOPIC
Bilirubin Urine: NEGATIVE
Glucose, UA: NEGATIVE mg/dL
Hgb urine dipstick: NEGATIVE
Ketones, ur: NEGATIVE mg/dL
Nitrite: NEGATIVE
Protein, ur: NEGATIVE mg/dL
Specific Gravity, Urine: 1.013 (ref 1.005–1.030)
pH: 5 (ref 5.0–8.0)

## 2024-06-12 MED ORDER — TERCONAZOLE 0.4 % VA CREA: 1.0000 | TOPICAL_CREAM | Freq: Every day | VAGINAL | 0 refills | Status: DC

## 2024-06-12 NOTE — ED Provider Notes (Signed)
 " [place] EMERGENCY DEPARTMENT AT [facility] Provider Note   CSN: [id number] Arrival date & time: 06/12/24  1830     Patient presents with: Vaginal Itching and Dysuria   [name] [name] is a 79 y.o. female.    Vaginal Itching  Dysuria  This patient is a 79 year old female, she was treated yesterday with oral Diflucan  after she did an electronic visit with her primary care doctor to let them know that she had some vaginal burning with urination after having diarrhea about a week ago with excessive wiping and inflammation around the perianal region.  She then developed some vaginal irritation and itching, she does not have any fevers or abdominal pain and denies any discharge.  She took Diflucan  yesterday but states nothing has helped    Prior to Admission medications  Medication Sig Start Date End Date Taking? Authorizing Provider  terconazole  (TERAZOL 7 ) 0.4 % vaginal cream Place 1 applicator vaginally at bedtime. 06/12/24  Yes Cleotilde Rogue, MD  amLODipine  (NORVASC ) 5 MG tablet TAKE 1 TABLET DAILY 06/01/24   Eubanks, Jessica K, NP  Ascorbic Acid (VITAMIN C PO) Take 1 tablet by mouth daily.    [provider]  aspirin  EC 81 MG tablet Take 81 mg by mouth daily.    [provider]  beta carotene w/minerals (OCUVITE) tablet Take 1 tablet by mouth in the morning and at bedtime.    [provider]  Calcium  Carbonate-Vitamin D (CALCIUM  600 + D PO) Take 1 tablet by mouth 2 (two) times daily.    [provider]  Cholecalciferol (VITAMIN D3) 250 MCG (10000 UT) capsule Take 10,000 Units by mouth daily.    [provider]  DENTA 5000 PLUS 1.1 % CREA dental cream Take 1 Application by mouth at bedtime. 11/06/23   [provider]  docusate sodium  (COLACE) 100 MG capsule Take 100 mg by mouth 2 (two) times daily.    [provider]  fluconazole  (DIFLUCAN ) 150 MG tablet Take 1 tablet (150 mg total) by mouth every three  (3) days as needed. 06/11/24   Fleming, Zelda W, NP  Lactobacillus-Inulin (CULTURELLE DIGESTIVE HEALTH PO) Take 1 capsule by mouth daily.     [provider]  losartan  (COZAAR ) 50 MG tablet TAKE 1 TABLET DAILY 03/03/24   Eubanks, Jessica K, NP  Misc Natural Products (OSTEO BI-FLEX ADV DOUBLE ST PO) Take 1 tablet by mouth 2 (two) times daily.    [provider]  omeprazole  (PRILOSEC) 40 MG capsule TAKE 1 CAPSULE DAILY 09/24/23   Eubanks, Jessica K, NP  polyethylene glycol powder (GLYCOLAX /MIRALAX ) powder Take 17 g by mouth in the morning and at bedtime.    [provider]  tiZANidine  (ZANAFLEX ) 4 MG capsule Take 1 capsule (4 mg total) by mouth 2 (two) times daily as needed for muscle spasms. 06/29/23   Leath-Warren, Etta PARAS, NP  Turmeric Curcumin CAPS Take 3 capsules by mouth daily. 04/16/24   [provider]  vitamin B-12 (CYANOCOBALAMIN) 1000 MCG tablet Take 1 tablet (1,000 mcg total) by mouth daily. 08/24/19   Eubanks, Jessica K, NP  zinc gluconate 50 MG tablet Take 50 mg by mouth daily.    [provider]    Allergies: Patient has no known allergies.    Review of Systems  Genitourinary:  Positive for dysuria.  All other systems reviewed and are negative.   Updated Vital Signs BP (!) 196/108 (BP Location: Right Arm)   Pulse 82  Temp 98.1 F (36.7 C) (Oral)   Resp 20   Ht 1.626 m (5' 4)   Wt 85.3 kg   LMP 09/10/2014   SpO2 96%   BMI 32.27 kg/m   Physical Exam Vitals and nursing note reviewed.  Constitutional:      General: She is not in acute distress.    Appearance: She is well-developed.  HENT:     Head: Normocephalic and atraumatic.     Mouth/Throat:     Pharynx: No oropharyngeal exudate.  Eyes:     General: No scleral icterus.       Right eye: No discharge.        Left eye: No discharge.     Conjunctiva/sclera: Conjunctivae normal.     Pupils: Pupils are equal, round, and reactive to light.  Neck:     Thyroid : No  thyromegaly.     Vascular: No JVD.  Cardiovascular:     Rate and Rhythm: Normal rate and regular rhythm.     Heart sounds: Normal heart sounds. No murmur heard.    No friction rub. No gallop.  Pulmonary:     Effort: Pulmonary effort is normal. No respiratory distress.     Breath sounds: Normal breath sounds. No wheezing or rales.  Abdominal:     General: Bowel sounds are normal. There is no distension.     Palpations: Abdomen is soft. There is no mass.     Tenderness: There is no abdominal tenderness.  Musculoskeletal:        General: No tenderness. Normal range of motion.     Cervical back: Normal range of motion and neck supple.  Lymphadenopathy:     Cervical: No cervical adenopathy.  Skin:    General: Skin is warm and dry.     Findings: No erythema or rash.  Neurological:     Mental Status: She is alert.     Coordination: Coordination normal.  Psychiatric:        Behavior: Behavior normal.     (all labs ordered are listed, but only abnormal results are displayed) Labs Reviewed  URINALYSIS, ROUTINE W REFLEX MICROSCOPIC - Abnormal; Notable for the following components:      Result Value   Leukocytes,Ua MODERATE (*)    Bacteria, UA RARE (*)    All other components within normal limits    EKG: None  Radiology: No results found.   Procedures   Medications Ordered in the ED - No data to display                                  Medical Decision Making Amount and/or Complexity of Data Reviewed Labs: ordered.  Risk Prescription drug management.   Genital exam with chaperone present shows: Mild erythema on the inner and external labia, no discharge, no foul smell, no significant tenderness induration or signs of labial abscess.  Patient's urinalysis is totally normal, no signs of infection  The patient was encouraged to use vagisil which has a benzocaine  component for topical anesthesia as well as terconazole  intravaginally, she appears otherwise stable for  discharge       Final diagnoses:  Acute vaginitis    ED Discharge Orders          Ordered    terconazole  (TERAZOL 7 ) 0.4 % vaginal cream  Daily at bedtime        06/12/24 2045  Cleotilde Rogue, MD 06/12/24 2046  "

## 2024-06-12 NOTE — Discharge Instructions (Addendum)
 Thankfully your testing has been very normal with no signs of urinary tract infection.  The tissue of the vagina appears to be very inflamed and for this I would like for you to use a combination of 2 different topical medications.  The first medication is called badges sill and this has a topical anesthetic which will help to anesthetize the vaginal tissue, please avoid any aggressive wiping, this can cause it to be worse, do not sit in bath water , just a brief shower, do not use any medicated or fragrance soaps as this can also make it worse.  Vagisil 2-3 times per day  Terconazole  cream

## 2024-06-12 NOTE — ED Triage Notes (Signed)
 Pt c/o vaginal itching and burning during urination x 1 week.

## 2024-06-12 NOTE — Progress Notes (Signed)
" °  Because your symptoms have not improved, I feel your condition warrants further evaluation and I recommend that you be seen in a face-to-face visit.   NOTE: There will be NO CHARGE for this E-Visit   If you are having a true medical emergency, please call 911.     For an urgent face to face visit, [facility] has multiple urgent care centers for your convenience.  Click the link below for the full list of locations and hours, walk-in wait times, appointment scheduling options and driving directions:  Urgent Care - [place], [place], [place], [place], [place], [place]  [place]     Your MyChart E-visit questionnaire answers were reviewed by a board certified advanced clinical practitioner to complete your personal care plan based on your specific symptoms.    Thank you for using e-Visits.    "

## 2024-06-15 ENCOUNTER — Encounter: Payer: Self-pay | Admitting: Nurse Practitioner

## 2024-06-16 ENCOUNTER — Encounter: Payer: Self-pay | Admitting: Adult Health

## 2024-06-16 ENCOUNTER — Ambulatory Visit: Admitting: Adult Health

## 2024-06-16 VITALS — BP 128/64 | HR 87 | Temp 98.0°F | Ht 64.0 in | Wt 192.6 lb

## 2024-06-16 DIAGNOSIS — N760 Acute vaginitis: Secondary | ICD-10-CM | POA: Diagnosis not present

## 2024-06-16 DIAGNOSIS — I10 Essential (primary) hypertension: Secondary | ICD-10-CM

## 2024-06-16 DIAGNOSIS — R21 Rash and other nonspecific skin eruption: Secondary | ICD-10-CM

## 2024-06-16 MED ORDER — TERCONAZOLE 0.4 % VA CREA: 1.0000 | TOPICAL_CREAM | Freq: Every day | VAGINAL | 0 refills | Status: AC

## 2024-06-16 NOTE — Progress Notes (Signed)
 "  [facility]  Provider:  Jereld Serum DNP  Code Status:  DNR  Goals of Care:     06/12/2024    6:37 PM  Advanced Directives  Does Patient Have a Medical Advance Directive? No  Would patient like information on creating a medical advance directive? No - Patient declined     Chief Complaint  Patient presents with   Follow-up    Vaginal itching. Bothersome more at night    Discussed the use of AI scribe software for clinical note transcription with the patient, who gave verbal consent to proceed.  HPI: Patient is a 79 y.o. female seen today for an acute visit for vaginal itching.  She was seen in the emergency department on June 12, 2024, and diagnosed with acute vaginitis. She was prescribed terconazole  0.4% for vaginal application daily at bedtime for seven days. She experiences persistent itching that disrupts her sleep, causing her to wake up and shower at night to alleviate symptoms. During the day, the itching is more tolerable, possibly due to being occupied with other activities.  She has been using Vagisil, which provides some relief, but she is concerned about overuse. She also mentions a recent episode of diarrhea following the use of red pepper flakes, which coincided with the onset of her symptoms. No urinary incontinence and her urine sample taken at the hospital was clear.  She has a history of hypertension, for which she takes losartan  50 mg daily and amlodipine  5 mg daily. She denies being diabetic and her recent blood work on December 29th showed normal blood sugar levels.  She also has a rash around her abdomen, which she attributes to moisture and has been treated by her dermatologist with fluticasone and ketoconazole. She applies this mixture as needed when the rash flares up, noting that it has worsened since the onset of vaginitis. She has not experienced this type of rash before and denies any blood in her urine or swelling.  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 She exercises regularly and tries to avoid sweets.    Past Medical History:  Diagnosis Date   Acid reflux    Bowel obstruction (HCC)    COVID-19    H/O hiatal hernia    Hyperlipidemia    Hypertension     Past Surgical History:  Procedure Laterality Date   ABDOMINAL HYSTERECTOMY     APPENDECTOMY     BREAST CYST EXCISION Right    [phone]? benign   COLONOSCOPY  03/18/2012   Procedure: COLONOSCOPY;  Surgeon: Claudis RAYMOND Rivet, MD;  Location: [facility];  Service: Endoscopy;  Laterality: N/A;  930   COLONOSCOPY WITH PROPOFOL  N/A 07/29/2022   Procedure: COLONOSCOPY WITH PROPOFOL ;  Surgeon: Eartha Angelia Sieving, MD;  Location: [facility];  Service: Gastroenterology;  Laterality: N/A;  2:15 PM   EXPLORATORY LAPAROTOMY     LAPAROTOMY N/A 02/14/2013   Procedure: EXPLORATORY LAPAROTOMY;  Surgeon: Oneil DELENA Budge, MD;  Location: [facility];  Service: General;  Laterality: N/A;   MELANOMA EXCISION  03/2019   Dr.Hall, removed from nack    POLYPECTOMY  07/29/2022   Procedure: POLYPECTOMY;  Surgeon: Eartha Angelia Sieving, MD;  Location: [facility];  Service: Gastroenterology;;   SKIN SURGERY  02/2019   Melanoma removed from upper back. Dr. Shona, John     Allergies[1]  Outpatient Encounter Medications as of 06/16/2024  Medication Sig   amLODipine  (NORVASC ) 5 MG tablet  TAKE 1 TABLET DAILY   Ascorbic Acid (VITAMIN C PO) Take 1 tablet by mouth daily.   aspirin  EC 81 MG tablet Take 81 mg by mouth daily.   beta carotene w/minerals (OCUVITE) tablet Take 1 tablet by mouth in the morning and at bedtime.   Calcium  Carbonate-Vitamin D (CALCIUM  600 + D PO) Take 1 tablet by mouth 2 (two) times daily.   Cholecalciferol (VITAMIN D3) 250 MCG (10000 UT) capsule Take 10,000 Units by mouth daily.   DENTA 5000 PLUS 1.1 % CREA dental cream Take 1 Application by mouth at bedtime.   docusate sodium  (COLACE) 100 MG  capsule Take 100 mg by mouth 2 (two) times daily.   fluconazole  (DIFLUCAN ) 150 MG tablet Take 1 tablet (150 mg total) by mouth every three (3) days as needed.   Fluticasone Propionate 0.05 % LOTN Apply topically.   ketoconazole (NIZORAL) 2 % cream Apply 1 Application topically as needed for irritation.   Lactobacillus-Inulin (CULTURELLE DIGESTIVE HEALTH PO) Take 1 capsule by mouth daily.    losartan  (COZAAR ) 50 MG tablet TAKE 1 TABLET DAILY   Misc Natural Products (OSTEO BI-FLEX ADV DOUBLE ST PO) Take 1 tablet by mouth 2 (two) times daily.   omeprazole  (PRILOSEC) 40 MG capsule TAKE 1 CAPSULE DAILY   polyethylene glycol powder (GLYCOLAX /MIRALAX ) powder Take 17 g by mouth in the morning and at bedtime.   tiZANidine  (ZANAFLEX ) 4 MG capsule Take 1 capsule (4 mg total) by mouth 2 (two) times daily as needed for muscle spasms.   Turmeric Curcumin CAPS Take 3 capsules by mouth daily.   vitamin B-12 (CYANOCOBALAMIN) 1000 MCG tablet Take 1 tablet (1,000 mcg total) by mouth daily.   zinc gluconate 50 MG tablet Take 50 mg by mouth daily.   [DISCONTINUED] terconazole  (TERAZOL 7 ) 0.4 % vaginal cream Place 1 applicator vaginally at bedtime.   terconazole  (TERAZOL 7 ) 0.4 % vaginal cream Place 1 applicator vaginally at bedtime.   No facility-administered encounter medications on file as of 06/16/2024.    Review of Systems:  Review of Systems  Constitutional:  Negative for appetite change, chills, fatigue and fever.  HENT:  Negative for congestion, hearing loss, rhinorrhea and sore throat.   Eyes: Negative.   Respiratory:  Negative for cough, shortness of breath and wheezing.   Cardiovascular:  Negative for chest pain, palpitations and leg swelling.  Gastrointestinal:  Negative for abdominal pain, constipation, diarrhea, nausea and vomiting.  Genitourinary:  Negative for dysuria.       Vaginal itching  Musculoskeletal:  Negative for arthralgias, back pain and myalgias.  Skin:  Negative for color change,  rash and wound.  Neurological:  Negative for dizziness, weakness and headaches.  Psychiatric/Behavioral:  Negative for behavioral problems. The patient is not nervous/anxious.     Health Maintenance  Topic Date Due   COVID-19 Vaccine (8 - 2025-26 season) 07/02/2024 (Originally 02/01/2024)   Medicare Annual Wellness (AWV)  03/28/2025   Colonoscopy  07/29/2025   Bone Density Scan  12/25/2025   DTaP/Tdap/Td (2 - Td or Tdap) 03/24/2028   Pneumococcal Vaccine: 50+ Years  Completed   Influenza Vaccine  Completed   Hepatitis C Screening  Completed   Zoster Vaccines- Shingrix  Completed   Meningococcal B Vaccine  Aged Out   Mammogram  Discontinued    Physical Exam: Vitals:   06/16/24 1424  BP: 128/64  Pulse: 87  Temp: 98 F (36.7 C)  TempSrc: Temporal  SpO2: 96%  Weight: 192 lb 9.6 oz (87.4 kg)  Height: 5' 4 (1.626 m)   Body mass index is 33.06 kg/m. Physical Exam Constitutional:      General: She is not in acute distress.    Appearance: She is obese.  HENT:     Head: Normocephalic and atraumatic.     Nose: Nose normal.     Mouth/Throat:     Mouth: Mucous membranes are moist.  Eyes:     Conjunctiva/sclera: Conjunctivae normal.  Cardiovascular:     Rate and Rhythm: Normal rate and regular rhythm.  Pulmonary:     Effort: Pulmonary effort is normal.     Breath sounds: Normal breath sounds.  Abdominal:     General: Bowel sounds are normal.     Palpations: Abdomen is soft.  Musculoskeletal:        General: Normal range of motion.     Cervical back: Normal range of motion.  Skin:    General: Skin is warm and dry.     Comments: Rashes on suprapubic area  Neurological:     General: No focal deficit present.     Mental Status: She is alert and oriented to person, place, and time.  Psychiatric:        Mood and Affect: Mood normal.        Behavior: Behavior normal.        Thought Content: Thought content normal.        Judgment: Judgment normal.    Labs  reviewed: Basic Metabolic Panel: Recent Labs    08/24/23 1425 01/07/24 0717 05/30/24 1535  NA 140 140 141  K 4.7 4.3 4.6  CL 103 106 103  CO2 28 26 26   GLUCOSE 80 96 97  BUN 12 10 12   CREATININE 0.60 0.72 0.55*  CALCIUM  9.9 9.3 9.4   Liver Function Tests: Recent Labs    08/24/23 1425 09/21/23 1316 01/07/24 0717 05/30/24 1535  AST 78*  --  39* 32  ALT 95* 78* 47* 43*  BILITOT 0.9  --  0.6 0.5  PROT 7.0  --  6.5 6.9   No results for input(s): LIPASE, AMYLASE in the last 8760 hours. No results for input(s): AMMONIA in the last 8760 hours. CBC: Recent Labs    08/24/23 1425 05/30/24 1535  WBC 9.4 10.8  NEUTROABS 4,954 5,648  HGB 15.6* 15.1  HCT 46.4* 45.2  MCV 94.9 95.4  PLT 297 304   Lipid Panel: Recent Labs    08/24/23 1425  CHOL 179  HDL 43*  LDLCALC 96  TRIG 716*  CHOLHDL 4.2   Lab Results  Component Value Date   HGBA1C 5.6 03/21/2019    Procedures since last visit: No results found.  Assessment/Plan  1. Acute vaginitis (Primary) -  Persistent itching despite initial tioconazole treatment. Symptoms improving but still bothersome. No discharge. Advised against excessive Vagisil use. - Continue terconazole  0.4% vaginal application for 7 more days. - Instructed to discontinue Vagisil if using terconazole . - terconazole  (TERAZOL 7 ) 0.4 % vaginal cream; Place 1 applicator vaginally at bedtime.  Dispense: 45 g; Refill: 0  2. Rash -  Peri-umbilical rash possibly due to moisture Managed with fluticasone and ketoconazole mix. - Continue fluticasone and ketoconazole mix as needed for flare-ups. -  follows up with dermatology - ketoconazole (NIZORAL) 2 % cream; Apply 1 Application topically as needed for irritation. - Fluticasone Propionate 0.05 % LOTN; Apply topically.  3. Essential hypertension -  Blood pressure well-controlled with current regimen. - Continue losartan  50 mg daily and amlodipine  5  mg daily.      Labs/tests ordered:   None   Return if symptoms worsen or fail to improve.  Kenzly Rogoff Medina-Vargas, NP      [1] No Known Allergies  "

## 2024-06-24 ENCOUNTER — Ambulatory Visit: Payer: Self-pay | Admitting: Nurse Practitioner

## 2024-06-24 DIAGNOSIS — N760 Acute vaginitis: Secondary | ICD-10-CM

## 2024-06-24 NOTE — Telephone Encounter (Signed)
 FYI Only or Action Required?: Action required by provider: request for appointment.  Patient was last seen in primary care on 06/16/2024 by Medina-Vargas, Jereld BROCKS, NP.  Called Nurse Triage reporting Vaginitis.  Symptoms began 2 weeks ago.  Interventions attempted: Prescription medications: terconazole  0.4%.  Symptoms are: unchanged.  Triage Disposition: See PCP When Office is Open (Within 3 Days)  Patient/caregiver understands and will follow disposition?: Yes, but will wait                              1. SYMPTOM: What's the main symptom you're concerned about? (e.g., pain, itching, dryness)     Vaginal itching 2. LOCATION: Where is the itching located? (e.g., inside/outside, left/right)     External vagina, near where she urinates 3. ONSET: When did the itching start?     2 weeks ago  4. PAIN: Is there any pain? If Yes, ask: How bad is it? (Scale: 1-10; mild, moderate, severe)     Denies 5. ITCHING: Is there any itching? If Yes, ask: How bad is it? (Scale: 1-10; mild, moderate, severe)     Moderate to severe, worsened throughout today 6. CAUSE: What do you think is causing the discharge? Have you had the same problem before? What happened then?     Vaginitis 7. OTHER SYMPTOMS: Do you have any other symptoms? (e.g., fever, itching, vaginal bleeding, pain with urination, injury to genital area, vaginal foreign body)     Denies: fever, bleeding, discharge    Patient was evaluated in office for symptoms on 06/16/24. Patient has been using terconazole  0.4% as directed. Patient reported symptoms have flared-up again today. This RN advised further in-person evaluation. Patient declined appointments with alternate providers and specifically requested to see PCP. Please advise on scheduling. Patient would like a call back. Patient is requesting a refill of terconazole  0.4% in the meantime.   Reason for Disposition  [1] Vaginal itching AND  [2] not improved > 3 days following Care Advice  Protocols used: Vaginal Symptoms-A-AH  Summary: diagnosis with vagnitis where urinate is hot and itching really bad   Reason for Triage: patient diagnosis with vagnitis ,  the skin where urinate is hot and itching really bad, patient went to er on 06/12/24

## 2024-06-24 NOTE — Telephone Encounter (Signed)
 See triage notes from triage nurse. Please advise

## 2024-06-25 ENCOUNTER — Encounter: Payer: Self-pay | Admitting: Nurse Practitioner

## 2024-06-25 ENCOUNTER — Other Ambulatory Visit: Payer: Self-pay | Admitting: Adult Health

## 2024-06-25 DIAGNOSIS — N760 Acute vaginitis: Secondary | ICD-10-CM

## 2024-06-26 NOTE — Telephone Encounter (Signed)
 Based on review of records she has taken diflucan  and terconazole  (TERAZOL 7 ) for an extended course- would recommend her going to GYN for further evaluation since she is still having symptoms that have not improved with this treatment.  Will send in referral if needed

## 2024-06-27 ENCOUNTER — Telehealth: Payer: Self-pay | Admitting: Pharmacist

## 2024-06-27 NOTE — Telephone Encounter (Signed)
 Mychart message was sent also. Patient seen Jereld Fredda Delude, NP, however Jereld is out of office this week, sending to PCP

## 2024-06-27 NOTE — Telephone Encounter (Signed)
 [name] [name] is out of office this week, sending to PCP

## 2024-06-28 ENCOUNTER — Other Ambulatory Visit: Payer: Self-pay | Admitting: Adult Health

## 2024-06-28 DIAGNOSIS — N760 Acute vaginitis: Secondary | ICD-10-CM

## 2024-06-28 NOTE — Telephone Encounter (Signed)
 She has already been on multiple course of an antifungal and I think this needs to be looked at by a gyn that is why I recommended a referral- if she has one and does not need a referral let me know.

## 2024-06-28 NOTE — Progress Notes (Signed)
" ° °  06/27/2024 Name: [name] [name] MRN: [id number] DOB: 1945/11/09  Received a message that the Patient was having issues getting her Terconazole  vaginal cream filled.  Called [facility] but did they did not answer. Called the Patient. HIPAA identifiers were obtained.  She said she picked up the Terconazole  vaginal cream that was sent for her on 06/17/23 but she continued to have symptoms. She said she spoke with someone at the office who told her they would send in another tube of Terconazole  for her on 06/24/24.  Per Pharmacy fill history, this prescription had not been sent.  A message was sent back over to the clinic to [facility], CMA and then eventually [name], NP. A decision was made to make an urgent gynenocology referral for the Patient.  Pharmacy case closed.  [name], PharmD, BCACP Clinical Pharmacist [phone]    "

## 2024-06-28 NOTE — Addendum Note (Signed)
 Addended by: Anny Sayler K on: 06/28/2024 01:52 PM   Modules accepted: Orders

## 2024-06-28 NOTE — Telephone Encounter (Signed)
 Waiting for provider to send another rx

## 2024-06-28 NOTE — Telephone Encounter (Signed)
 Spoke patient and she agrees with the recommendation of going to GYN.   Message sent to Caro Harlene POUR, NP

## 2024-06-29 NOTE — Therapy (Signed)
 " OUTPATIENT PHYSICAL THERAPY THORACOLUMBAR EVALUATION   Patient Name: [name] [name] MRN: [id number] DOB:[birth year], 79 y.o., female Today's Date: 06/30/2024  END OF SESSION:  PT End of Session - 06/30/24 1327     Visit Number 1    Authorization Type MEDICARE PART A AND B    Authorization Time Period no auth    PT Start Time 1243    PT Stop Time 1328    PT Time Calculation (min) 45 min    Activity Tolerance Patient tolerated treatment well    Behavior During Therapy WFL for tasks assessed/performed          Past Medical History:  Diagnosis Date   Acid reflux    Bowel obstruction (HCC)    COVID-19    H/O hiatal hernia    Hyperlipidemia    Hypertension    Past Surgical History:  Procedure Laterality Date   ABDOMINAL HYSTERECTOMY     APPENDECTOMY     BREAST CYST EXCISION Right    [phone]? benign   COLONOSCOPY  03/18/2012   Procedure: COLONOSCOPY;  Surgeon: Claudis RAYMOND Rivet, MD;  Location: [facility];  Service: Endoscopy;  Laterality: N/A;  930   COLONOSCOPY WITH PROPOFOL  N/A 07/29/2022   Procedure: COLONOSCOPY WITH PROPOFOL ;  Surgeon: Eartha Angelia Sieving, MD;  Location: [facility];  Service: Gastroenterology;  Laterality: N/A;  2:15 PM   EXPLORATORY LAPAROTOMY     LAPAROTOMY N/A 02/14/2013   Procedure: EXPLORATORY LAPAROTOMY;  Surgeon: Oneil DELENA Budge, MD;  Location: [facility];  Service: General;  Laterality: N/A;   MELANOMA EXCISION  03/2019   Dr.Hall, removed from nack    POLYPECTOMY  07/29/2022   Procedure: POLYPECTOMY;  Surgeon: Eartha Angelia Sieving, MD;  Location: [facility];  Service: Gastroenterology;;   SKIN SURGERY  02/2019   Melanoma removed from upper back. Dr. Shona, John    Patient Active Problem List   Diagnosis Date Noted   Metabolic dysfunction-associated steatohepatitis (MASH) 03/03/2024   Liver fibrosis 03/03/2024   LUQ pain 03/03/2024   Elevated ferritin level 09/21/2023   Encounter for screening colonoscopy 07/29/2022    Hx of small bowel obstruction 03/23/2018   GERD (gastroesophageal reflux disease) 03/24/2015   Hypokalemia 06/21/2014   Hyperglycemia 06/20/2014   Hypertension    Hyperlipidemia    Bowel obstruction (HCC) 10/01/2013   Hepatic steatosis 02/12/2013   Transaminitis 02/12/2013    PCP: Caro Harlene POUR, NP  REFERRING PROVIDER: Caro Harlene POUR, NP  REFERRING DIAG: [phone] (ICD-10-CM) - Chronic left-sided low back pain with left-sided sciatica  Rationale for Evaluation and Treatment: Rehabilitation  THERAPY DIAG:  Other low back pain  Leg weakness, bilateral  ONSET DATE: Fall of 2025  SUBJECTIVE:  SUBJECTIVE STATEMENT: Pt states she has started taking tumeric and back has improved tremendously. Pt states the back has not been hurting her at all that much now. Pt states back pain started after gardening and using a power tiller last fall. After this it hurt to mop and sweep, had to hire the chores out. Pt states she has been pain free for about 2-3 weeks.  PERTINENT HISTORY:  HTN, medicated   PAIN:  Are you having pain? Yes: NPRS scale: 0/10, for last week Pain location: left low back and left leg Pain description: stabbing Aggravating factors: mopping, walking Relieving factors: medications and rest, avoiding awkward positioning of the LLE  PRECAUTIONS: None  RED FLAGS: None   WEIGHT BEARING RESTRICTIONS: No  FALLS:  Has patient fallen in last 6 months? No  OCCUPATION: Agricultural Consultant at [employer], part time job at [employer]  PLOF: Independent and Independent with basic ADLs  PATIENT GOALS: gardening and tilling ideally  NEXT MD VISIT: none  OBJECTIVE:  Note: Objective measures were completed at Evaluation unless otherwise noted.  DIAGNOSTIC FINDINGS:  None of  low back  PATIENT SURVEYS: none completed this date      PALPATION: Slight increased in tenderness to mobilization of L3-L5 lumbar segments.  LUMBAR ROM:   AROM eval  Flexion 80  Extension 17  Right lateral flexion 20  Left lateral flexion 18  Right rotation WFL  Left rotation WF:   (Blank rows = not tested)  LOWER EXTREMITY ROM:     Active  Right eval Left eval  Hip flexion    Hip extension    Hip abduction    Hip adduction    Hip internal rotation    Hip external rotation    Knee flexion    Knee extension    Ankle dorsiflexion    Ankle plantarflexion    Ankle inversion    Ankle eversion     (Blank rows = not tested)  LOWER EXTREMITY MMT:    MMT Right eval Left eval  Hip flexion 4 4  Hip extension 4 4  Hip abduction 4 4  Hip adduction    Hip internal rotation    Hip external rotation    Knee flexion    Knee extension    Ankle dorsiflexion    Ankle plantarflexion    Ankle inversion    Ankle eversion     (Blank rows = not tested)  LUMBAR SPECIAL TESTS:  Straight leg raise test: Negative  FUNCTIONAL TESTS:  2 minute walk test: 385 feet, no increase pain  GAIT: Distance walked: 440 Assistive device utilized: None Level of assistance: Complete Independence Comments: WFL  TREATMENT DATE:  06/30/2024   Evaluation: -ROM measured, Strength assessed, HEP prescribed, pt educated on prognosis, findings, and importance of HEP compliance if given.  PATIENT EDUCATION:  Education details: Pt was educated on findings of PT evaluation, prognosis, frequency of therapy visits and rationale, attendance policy, and HEP if given.   Person educated: Patient Education method: Explanation, Verbal cues, and Handouts Education comprehension: verbalized understanding, tactile cues required, and needs further education  HOME  EXERCISE PROGRAM: Access Code: [email or web address] URL: [email or web address] Date: 06/30/2024 Prepared by: Lang Ada  Exercises - Supine Bridge  - 1 x daily - 7 x weekly - 3 sets - 10 reps - Clamshell  - 1 x daily - 7 x weekly - 3 sets - 10 reps - Supine Lower Trunk Rotation  - 1 x daily - 7 x weekly - 3 sets - 10 reps - Neutral Curl Up with Straight Leg  - 1 x daily - 7 x weekly - 3 sets - 10 reps - Bird Dog  - 1 x daily - 7 x weekly - 3 sets - 10 reps - Side Plank on Knees  - 1 x daily - 7 x weekly - 3 sets - 10 reps  ASSESSMENT:  CLINICAL IMPRESSION: Patient is a 79 y.o. female who was seen today for physical therapy evaluation and treatment for M54.42,G89.29 (ICD-10-CM) - Chronic left-sided low back pain with left-sided sciatica.   Patient demonstrates no pain in low back, decreased core and LE strength, and WFL functional mobility. Patient also demonstrates good performance with ambulation today with no increase in symptoms reported. Patients symptoms likely arising from age related changes and poor lifting mechanics as well as decreased core control. Patient also demonstrates abnormal tenderness to palpation of lumbar spinal segments L3-L5 as well as to L side sciatic region. Patient requires education on role of PT, prognosis, thoracolumbar spine anatomy, importance of HEP compliance and overall POC. Patient discharged at this time due to being pain free for the last 3 weeks and being comfortable with HEP independence.    OBJECTIVE IMPAIRMENTS: decreased activity tolerance and decreased strength.   ACTIVITY LIMITATIONS: carrying and lifting  PARTICIPATION LIMITATIONS: yard work  PERSONAL FACTORS: Age and Past/current experiences are also affecting patient's functional outcome.   REHAB POTENTIAL: Good  CLINICAL DECISION MAKING: Stable/uncomplicated  EVALUATION COMPLEXITY: Low   GOALS: Eval only no goals set.   PLAN:  PT FREQUENCY: one time visit  PT DURATION:  eval only  PLANNED INTERVENTIONS: Discharged    Lang Ada, PT, DPT [facility] Office: [phone] 1:30 PM, 06/30/24   "

## 2024-06-30 ENCOUNTER — Other Ambulatory Visit: Payer: Self-pay

## 2024-06-30 ENCOUNTER — Ambulatory Visit (HOSPITAL_COMMUNITY): Attending: Nurse Practitioner

## 2024-06-30 ENCOUNTER — Encounter (HOSPITAL_COMMUNITY): Payer: Self-pay

## 2024-06-30 DIAGNOSIS — R29898 Other symptoms and signs involving the musculoskeletal system: Secondary | ICD-10-CM | POA: Insufficient documentation

## 2024-06-30 DIAGNOSIS — G8929 Other chronic pain: Secondary | ICD-10-CM | POA: Diagnosis not present

## 2024-06-30 DIAGNOSIS — M5459 Other low back pain: Secondary | ICD-10-CM | POA: Diagnosis present

## 2024-06-30 DIAGNOSIS — M5442 Lumbago with sciatica, left side: Secondary | ICD-10-CM | POA: Diagnosis not present

## 2024-07-14 ENCOUNTER — Encounter: Admitting: Adult Health

## 2024-11-28 ENCOUNTER — Ambulatory Visit: Admitting: Nurse Practitioner

## 2025-03-31 ENCOUNTER — Ambulatory Visit: Payer: Self-pay | Admitting: Nurse Practitioner
